# Patient Record
Sex: Male | Born: 1950 | Race: White | Hispanic: No | Marital: Married | Smoking: Former smoker
Health system: Southern US, Community
[De-identification: ages and names within clinical notes are randomized; demographics above are authoritative.]

## PROBLEM LIST (undated history)

## (undated) DIAGNOSIS — E785 Hyperlipidemia, unspecified: Secondary | ICD-10-CM

## (undated) DIAGNOSIS — G4700 Insomnia, unspecified: Secondary | ICD-10-CM

## (undated) DIAGNOSIS — IMO0002 Reserved for concepts with insufficient information to code with codable children: Secondary | ICD-10-CM

## (undated) DIAGNOSIS — L989 Disorder of the skin and subcutaneous tissue, unspecified: Secondary | ICD-10-CM

## (undated) DIAGNOSIS — Z973 Presence of spectacles and contact lenses: Secondary | ICD-10-CM

## (undated) DIAGNOSIS — L309 Dermatitis, unspecified: Secondary | ICD-10-CM

## (undated) DIAGNOSIS — H60399 Other infective otitis externa, unspecified ear: Secondary | ICD-10-CM

## (undated) DIAGNOSIS — M169 Osteoarthritis of hip, unspecified: Secondary | ICD-10-CM

## (undated) DIAGNOSIS — M25552 Pain in left hip: Secondary | ICD-10-CM

## (undated) DIAGNOSIS — K219 Gastro-esophageal reflux disease without esophagitis: Secondary | ICD-10-CM

## (undated) DIAGNOSIS — K220 Achalasia of cardia: Secondary | ICD-10-CM

## (undated) DIAGNOSIS — I10 Essential (primary) hypertension: Secondary | ICD-10-CM

## (undated) DIAGNOSIS — H9319 Tinnitus, unspecified ear: Secondary | ICD-10-CM

## (undated) DIAGNOSIS — M25551 Pain in right hip: Secondary | ICD-10-CM

## (undated) DIAGNOSIS — Z1211 Encounter for screening for malignant neoplasm of colon: Secondary | ICD-10-CM

## (undated) DIAGNOSIS — E663 Overweight: Secondary | ICD-10-CM

## (undated) DIAGNOSIS — M1990 Unspecified osteoarthritis, unspecified site: Secondary | ICD-10-CM

## (undated) DIAGNOSIS — K635 Polyp of colon: Secondary | ICD-10-CM

## (undated) HISTORY — DX: Polyp of colon: K63.5

## (undated) HISTORY — DX: Other infective otitis externa, unspecified ear: H60.399

## (undated) HISTORY — PX: UPPER GASTROINTESTINAL ENDOSCOPY: SHX188

## (undated) HISTORY — DX: Overweight: E66.3

## (undated) HISTORY — PX: ESOPHAGOGASTRODUODENOSCOPY: SHX1529

## (undated) HISTORY — DX: Reserved for concepts with insufficient information to code with codable children: IMO0002

## (undated) HISTORY — DX: Hyperlipidemia, unspecified: E78.5

## (undated) HISTORY — DX: Encounter for screening for malignant neoplasm of colon: Z12.11

## (undated) HISTORY — DX: Dermatitis, unspecified: L30.9

## (undated) HISTORY — DX: Unspecified osteoarthritis, unspecified site: M19.90

## (undated) HISTORY — PX: COLONOSCOPY: SHX174

## (undated) HISTORY — DX: Gastro-esophageal reflux disease without esophagitis: K21.9

## (undated) HISTORY — DX: Pain in right hip: M25.551

## (undated) HISTORY — DX: Achalasia of cardia: K22.0

## (undated) HISTORY — DX: Essential (primary) hypertension: I10

## (undated) HISTORY — DX: Tinnitus, unspecified ear: H93.19

## (undated) HISTORY — DX: Pain in left hip: M25.552

## (undated) HISTORY — PX: TONSILLECTOMY: SUR1361

## (undated) HISTORY — DX: Insomnia, unspecified: G47.00

## (undated) HISTORY — DX: Disorder of the skin and subcutaneous tissue, unspecified: L98.9

---
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
## 1997-04-12 HISTORY — PX: HELLER MYOTOMY: SHX5259

## 2011-03-25 ENCOUNTER — Telehealth: Payer: Self-pay | Admitting: Internal Medicine

## 2011-03-25 ENCOUNTER — Encounter: Payer: Self-pay | Admitting: Internal Medicine

## 2011-03-25 ENCOUNTER — Ambulatory Visit (INDEPENDENT_AMBULATORY_CARE_PROVIDER_SITE_OTHER): Payer: PRIVATE HEALTH INSURANCE | Admitting: Internal Medicine

## 2011-03-25 VITALS — BP 126/90 | HR 69 | Temp 98.0°F | Resp 18 | Ht 66.5 in | Wt 206.0 lb

## 2011-03-25 DIAGNOSIS — Z0000 Encounter for general adult medical examination without abnormal findings: Secondary | ICD-10-CM

## 2011-03-25 DIAGNOSIS — Z79899 Other long term (current) drug therapy: Secondary | ICD-10-CM

## 2011-03-25 DIAGNOSIS — K220 Achalasia of cardia: Secondary | ICD-10-CM

## 2011-03-25 DIAGNOSIS — I10 Essential (primary) hypertension: Secondary | ICD-10-CM

## 2011-03-25 DIAGNOSIS — Z125 Encounter for screening for malignant neoplasm of prostate: Secondary | ICD-10-CM

## 2011-03-25 LAB — BASIC METABOLIC PANEL
CO2: 24 mEq/L (ref 19–32)
Calcium: 9.2 mg/dL (ref 8.4–10.5)
Chloride: 101 mEq/L (ref 96–112)
Glucose, Bld: 110 mg/dL — ABNORMAL HIGH (ref 70–99)
Potassium: 5 mEq/L (ref 3.5–5.3)
Sodium: 138 mEq/L (ref 135–145)

## 2011-03-25 MED ORDER — ENALAPRIL MALEATE 10 MG PO TABS: 10.0000 mg | ORAL_TABLET | Freq: Every day | ORAL | Status: DC

## 2011-03-25 NOTE — Telephone Encounter (Signed)
Lab orders entered for June 2013. 

## 2011-03-25 NOTE — Telephone Encounter (Signed)
Pharmacy comments: [facility] must fill 90-day supply on Enalapril maleate 10mg  tab.

## 2011-03-25 NOTE — Telephone Encounter (Signed)
Refills were given previously x 11. Resent Rx for #90 x 3 refills.

## 2011-03-25 NOTE — Patient Instructions (Signed)
Please schedule cbc, chem7, lipid, lft, psa, tsh and urinalysis v70.0 prior to next physical in May

## 2011-03-27 ENCOUNTER — Encounter: Payer: Self-pay | Admitting: Internal Medicine

## 2011-03-27 DIAGNOSIS — K220 Achalasia of cardia: Secondary | ICD-10-CM | POA: Insufficient documentation

## 2011-03-27 DIAGNOSIS — I10 Essential (primary) hypertension: Secondary | ICD-10-CM | POA: Insufficient documentation

## 2011-03-27 NOTE — Progress Notes (Signed)
  Subjective:    Patient ID: Gabriel [name], male    DOB: October 13, 1950, 60 y.o.   MRN: [id number]  HPI Pt presents to clinic to establish care and for follow up of HTN. BP reviewed minimally elevated but home monitoring has been normotensive. Tolerates ace inhibitor without cough or angioedema. Recalls CPE in May with labs. Known h/o achalasia and has been released from GI. asx without swallowing difficulty. No other complaints.  Past Medical History  Diagnosis Date  . Hypertension    Past Surgical History  Procedure Date  . Esophagus surgery 1999    help with swallowing  . Tonsillectomy and adenoidectomy 1963    reports that he has quit smoking. He has never used smokeless tobacco. He reports that he drinks alcohol. He reports that he does not use illicit drugs. family history includes Alcohol abuse in an unspecified family member; Diabetes in his maternal grandmother; and Ovarian cancer in his mother. No Known Allergies   Review of Systems  HENT: Negative for facial swelling.   Respiratory: Negative for cough and shortness of breath.   All other systems reviewed and are negative.       Objective:   Physical Exam  Physical Exam  Nursing note and vitals reviewed. Constitutional: Appears well-developed and well-nourished. No distress.  HENT:  Head: Normocephalic and atraumatic.  Right Ear: External ear normal.  Left Ear: External ear normal.  Eyes: Conjunctivae are normal. No scleral icterus.  Neck: Neck supple. Carotid bruit is not present.  Cardiovascular: Normal rate, regular rhythm and normal heart sounds.  Exam reveals no gallop and no friction rub.   No murmur heard. Pulmonary/Chest: Effort normal and breath sounds normal. No respiratory distress. He has no wheezes. no rales.  Lymphadenopathy:    He has no cervical adenopathy.  Neurological:Alert.  Skin: Skin is warm and dry. Not diaphoretic.  Psychiatric: Has a normal mood and affect.        Assessment & Plan:

## 2011-03-27 NOTE — Assessment & Plan Note (Signed)
asx

## 2011-03-27 NOTE — Assessment & Plan Note (Signed)
Stable. Rf enalapril. Obtain chem7. Schedule cpe in the spring

## 2011-04-01 ENCOUNTER — Telehealth: Payer: Self-pay | Admitting: Internal Medicine

## 2011-04-01 NOTE — Telephone Encounter (Signed)
Received medical records from [facility] center.

## 2011-04-20 ENCOUNTER — Encounter: Payer: Self-pay | Admitting: Internal Medicine

## 2011-04-20 ENCOUNTER — Ambulatory Visit (INDEPENDENT_AMBULATORY_CARE_PROVIDER_SITE_OTHER): Payer: PRIVATE HEALTH INSURANCE | Admitting: Internal Medicine

## 2011-04-20 DIAGNOSIS — M7700 Medial epicondylitis, unspecified elbow: Secondary | ICD-10-CM | POA: Insufficient documentation

## 2011-04-20 NOTE — Assessment & Plan Note (Signed)
Attempt 7-10 day course of lodine with food and no other nsaids.Followup if no improvement or worsening.

## 2011-04-20 NOTE — Progress Notes (Signed)
  Subjective:    Patient ID: Gabriel [name], male    DOB: 02-Apr-1951, 61 y.o.   MRN: [id number]  HPI Pt presents to clinic for evaluation of arm pain. Notes 3wk h/o left medial elbow pain without h/o trauma. Does lift boxes at work regularly. Noticed a popping sensation at the area once but has FROM. No pain on flexion or extension but pain occurs with pronation/supination. Took lodine a few times and noted improvement. No other alleviating or exacerbating factors. No other complaints. Total time of appointment ~21 minutes of which >50% spent in counseling.   Past Medical History  Diagnosis Date  . Hypertension    Past Surgical History  Procedure Date  . Esophagus surgery 1999    help with swallowing  . Tonsillectomy and adenoidectomy 1963    reports that he has quit smoking. He has never used smokeless tobacco. He reports that he drinks alcohol. He reports that he does not use illicit drugs. family history includes Alcohol abuse in an unspecified family member; Diabetes in his maternal grandmother; and Ovarian cancer in his mother. No Known Allergies   Review of Systems see hpi     Objective:   Physical Exam  Nursing note and vitals reviewed. Constitutional: He appears well-developed and well-nourished. No distress.  HENT:  Head: Normocephalic and atraumatic.  Musculoskeletal:       FROM left arm and elbow. No erythema, warmth or effusion. No biceps tendon tenderness. Pain localized to medial epicondyl area. Pain reproduced with pronation and supination.  Neurological: He is alert.  Skin: Skin is warm and dry. He is not diaphoretic.  Psychiatric: He has a normal mood and affect.          Assessment & Plan:

## 2011-09-14 ENCOUNTER — Other Ambulatory Visit: Payer: Self-pay | Admitting: *Deleted

## 2011-09-14 DIAGNOSIS — Z125 Encounter for screening for malignant neoplasm of prostate: Secondary | ICD-10-CM

## 2011-09-14 DIAGNOSIS — Z0000 Encounter for general adult medical examination without abnormal findings: Secondary | ICD-10-CM

## 2011-09-14 LAB — HEPATIC FUNCTION PANEL
ALT: 41 U/L (ref 0–53)
AST: 21 U/L (ref 0–37)
Albumin: 4.5 g/dL (ref 3.5–5.2)
Alkaline Phosphatase: 44 U/L (ref 39–117)
Bilirubin, Direct: 0.1 mg/dL (ref 0.0–0.3)
Indirect Bilirubin: 0.6 mg/dL (ref 0.0–0.9)
Total Bilirubin: 0.7 mg/dL (ref 0.3–1.2)
Total Protein: 6.9 g/dL (ref 6.0–8.3)

## 2011-09-14 LAB — CBC
HCT: 46.9 % (ref 39.0–52.0)
Hemoglobin: 15.7 g/dL (ref 13.0–17.0)
MCHC: 33.5 g/dL (ref 30.0–36.0)
MCV: 93.1 fL (ref 78.0–100.0)

## 2011-09-14 LAB — LIPID PANEL
Cholesterol: 214 mg/dL — ABNORMAL HIGH (ref 0–200)
LDL Cholesterol: 143 mg/dL — ABNORMAL HIGH (ref 0–99)
VLDL: 22 mg/dL (ref 0–40)

## 2011-09-14 LAB — TSH: TSH: 0.922 u[IU]/mL (ref 0.350–4.500)

## 2011-09-14 LAB — BASIC METABOLIC PANEL
BUN: 13 mg/dL (ref 6–23)
Creat: 0.93 mg/dL (ref 0.50–1.35)
Glucose, Bld: 110 mg/dL — ABNORMAL HIGH (ref 70–99)
Potassium: 5 mEq/L (ref 3.5–5.3)

## 2011-09-15 LAB — URINALYSIS, ROUTINE W REFLEX MICROSCOPIC
Leukocytes, UA: NEGATIVE
Nitrite: NEGATIVE
Specific Gravity, Urine: 1.017 (ref 1.005–1.030)
pH: 6 (ref 5.0–8.0)

## 2011-09-22 ENCOUNTER — Telehealth: Payer: Self-pay | Admitting: Internal Medicine

## 2011-09-22 ENCOUNTER — Ambulatory Visit (INDEPENDENT_AMBULATORY_CARE_PROVIDER_SITE_OTHER): Payer: PRIVATE HEALTH INSURANCE | Admitting: Internal Medicine

## 2011-09-22 VITALS — BP 124/100 | HR 78 | Temp 97.5°F | Resp 18 | Ht 66.5 in | Wt 207.0 lb

## 2011-09-22 DIAGNOSIS — R079 Chest pain, unspecified: Secondary | ICD-10-CM

## 2011-09-22 DIAGNOSIS — Z0000 Encounter for general adult medical examination without abnormal findings: Secondary | ICD-10-CM

## 2011-09-22 DIAGNOSIS — E785 Hyperlipidemia, unspecified: Secondary | ICD-10-CM

## 2011-09-22 DIAGNOSIS — R739 Hyperglycemia, unspecified: Secondary | ICD-10-CM

## 2011-09-22 MED ORDER — ENALAPRIL MALEATE 10 MG PO TABS: 10.0000 mg | ORAL_TABLET | Freq: Every day | ORAL | Status: DC

## 2011-09-22 MED ORDER — ETODOLAC 500 MG PO TABS: 500.0000 mg | ORAL_TABLET | Freq: Two times a day (BID) | ORAL | Status: DC | PRN

## 2011-09-22 NOTE — Telephone Encounter (Signed)
Lab orders entered for September 2013. 

## 2011-09-22 NOTE — Patient Instructions (Addendum)
We are in the process of scheduling your heart stress test. Please schedule fasting labs prior to your next visit (lipid-272.4 and a1c-hyperglycemia)

## 2011-09-29 ENCOUNTER — Ambulatory Visit (HOSPITAL_COMMUNITY): Payer: PRIVATE HEALTH INSURANCE | Attending: Cardiology | Admitting: Radiology

## 2011-09-29 ENCOUNTER — Encounter: Payer: Self-pay | Admitting: Internal Medicine

## 2011-09-29 VITALS — BP 145/91 | Ht 65.5 in | Wt 203.0 lb

## 2011-09-29 DIAGNOSIS — R0602 Shortness of breath: Secondary | ICD-10-CM

## 2011-09-29 DIAGNOSIS — R079 Chest pain, unspecified: Secondary | ICD-10-CM | POA: Insufficient documentation

## 2011-09-29 DIAGNOSIS — Z0000 Encounter for general adult medical examination without abnormal findings: Secondary | ICD-10-CM | POA: Insufficient documentation

## 2011-09-29 MED ORDER — TECHNETIUM TC 99M TETROFOSMIN IV KIT
11.0000 | PACK | Freq: Once | INTRAVENOUS | Status: AC | PRN
  Administered 2011-09-29: 11 via INTRAVENOUS

## 2011-09-29 MED ORDER — TECHNETIUM TC 99M TETROFOSMIN IV KIT
33.0000 | PACK | Freq: Once | INTRAVENOUS | Status: AC | PRN
  Administered 2011-09-29: 33 via INTRAVENOUS

## 2011-09-29 NOTE — Assessment & Plan Note (Signed)
Nl exam

## 2011-09-29 NOTE — Progress Notes (Signed)
[facility] SITE 3 NUCLEAR MED [address] [place] [address] [phone]  Cardiology Nuclear Med Study  Gabriel [name] is a 61 y.o. male     MRN : [id number]     DOB: Oct 03, 1950  Procedure Date: 09/29/2011  Nuclear Med Background Indication for Stress Test:  Evaluation for Ischemia History:  No Prior Cardiac History Cardiac Risk Factors: History of Smoking and Hypertension  Symptoms:  Chest Pain with Exertion (last date of chest discomfort 1 week ago) and DOE   Nuclear Pre-Procedure Caffeine/Decaff Intake:  None NPO After: 7:00pm   Lungs:  clear O2 Sat: 98% on room air. IV 0.9% NS with Angio Cath:  20g  IV Site: R Antecubital  IV Started by:  Stanton Kidney, EMT-P  Chest Size (in):  42 Cup Size: n/a  Height: 5' 5.5" (1.664 m)  Weight:  203 lb (92.08 kg)  BMI:  Body mass index is 33.27 kg/(m^2). Tech Comments:  NA    Nuclear Med Study 1 or 2 day study: 1 day  Stress Test Type:  Stress  Reading MD: Cassell Clement, MD  Order Authorizing Provider:  Charlynn Court, MD  Resting Radionuclide: Technetium 35m Tetrofosmin  Resting Radionuclide Dose: 11.0 mCi   Stress Radionuclide:  Technetium 66m Tetrofosmin  Stress Radionuclide Dose: 33.0 mCi           Stress Protocol Rest HR: 76 Stress HR: 157  Rest BP: 145/91 Stress BP: 206/73  Exercise Time (min): 7:01 METS: 8.5   Predicted Max HR: 161 bpm % Max HR: 97.52 bpm Rate Pressure Product: 27253   Dose of Adenosine (mg):  n/a Dose of Lexiscan: n/a mg  Dose of Atropine (mg): n/a Dose of Dobutamine: n/a mcg/kg/min (at max HR)  Stress Test Technologist: Bonnita Levan, RN  Nuclear Technologist:  Domenic Polite, CNMT     Rest Procedure:  Myocardial perfusion imaging was performed at rest 45 minutes following the intravenous administration of Technetium 55m Tetrofosmin. Rest ECG: NSR  Stress Procedure:  The patient performed treadmill exercise using a Bruce  Protocol for 7:01 minutes. The patient stopped due  to dyspnea, fatigue and c/o of CP ( 5/10 with radiation to neck,) with exercise. His CP immediately resolved in recovery.  There were no acute changes. Technetium 40m Tetrofosmin was injected at peak exercise and myocardial perfusion imaging was performed after a brief delay. Stress ECG: No significant change from baseline ECG  QPS Raw Data Images:  Normal; no motion artifact; normal heart/lung ratio. Stress Images:  Normal homogeneous uptake in all areas of the myocardium. Rest Images:  Normal homogeneous uptake in all areas of the myocardium. Subtraction (SDS):  No evidence of ischemia. Transient Ischemic Dilatation (Normal <1.22):  0.85 Lung/Heart Ratio (Normal <0.45):  0.30  Quantitative Gated Spect Images QGS EDV:  75 ml QGS ESV:  29 ml  Impression Exercise Capacity:  Fair exercise capacity. BP Response:  Hypertensive blood pressure response. Clinical Symptoms:  There is dyspnea.Brief chest pain resolves immediately in recovery. Mild exp. wheezing ECG Impression:  No significant ST segment change suggestive of ischemia. Comparison with Prior Nuclear Study: No previous nuclear study performed  Overall Impression:  Normal stress nuclear study.  LV Ejection Fraction: 61%.  LV Wall Motion:  NL LV Function; NL Wall Motion  [employer]

## 2011-09-29 NOTE — Assessment & Plan Note (Signed)
EKG obtained demonstrating nsr with nl intervals and axis. Schedule nuclear stress test for further evaluation.

## 2011-09-29 NOTE — Progress Notes (Signed)
  Subjective:    Patient ID: Gabriel [name], male    DOB: 01-13-1951, 61 y.o.   MRN: [id number]  HPI Pt presents to clinic for annual physical. bp rechecked 128/82. Home bp's nl. H/o achalasia and asx. Notes intermittent chest discomfort without radiation. Occurs with exertion and has some degree of associated dyspnea. Duration 1-2 mins followed by spontaneous resolution. Currently asx.  Past Medical History  Diagnosis Date  . Hypertension    Past Surgical History  Procedure Date  . Esophagus surgery 1999    help with swallowing  . Tonsillectomy and adenoidectomy 1963    reports that he has quit smoking. He has never used smokeless tobacco. He reports that he drinks alcohol. He reports that he does not use illicit drugs. family history includes Alcohol abuse in an unspecified family member; Diabetes in his maternal grandmother; and Ovarian cancer in his mother. No Known Allergies   Review of Systems see hpi     Objective:   Physical Exam  Physical Exam  Nursing note and vitals reviewed. Constitutional: He appears well-developed and well-nourished. No distress.  HENT:  Head: Normocephalic and atraumatic.  Right Ear: Tympanic membrane and external ear normal.  Left Ear: Tympanic membrane and external ear normal.  Nose: Nose normal.  Mouth/Throat: Uvula is midline, oropharynx is clear and moist and mucous membranes are normal. No oropharyngeal exudate.  Eyes: Conjunctivae and EOM are normal. Pupils are equal, round, and reactive to light. Right eye exhibits no discharge. Left eye exhibits no discharge. No scleral icterus.  Neck: Neck supple. Carotid bruit is not present. No thyromegaly present.  Cardiovascular: Normal rate, regular rhythm and normal heart sounds.  Exam reveals no gallop and no friction rub.   No murmur heard. Pulmonary/Chest: Effort normal and breath sounds normal. No respiratory distress. He has no wheezes. He has no rales.  Abdominal: Soft. He exhibits no  distension and no mass. There is no hepatosplenomegaly. There is no tenderness. There is no rebound. Hernia confirmed negative in the right inguinal area and confirmed negative in the left inguinal area.  Lymphadenopathy:    He has no cervical adenopathy.  Neurological: He is alert.  Skin: Skin is warm and dry. He is not diaphoretic.  Psychiatric: He has a normal mood and affect.        Assessment & Plan:

## 2011-10-01 ENCOUNTER — Ambulatory Visit (INDEPENDENT_AMBULATORY_CARE_PROVIDER_SITE_OTHER): Payer: PRIVATE HEALTH INSURANCE | Admitting: Internal Medicine

## 2011-10-01 ENCOUNTER — Encounter: Payer: Self-pay | Admitting: Internal Medicine

## 2011-10-01 VITALS — BP 122/80 | HR 79 | Temp 98.1°F | Resp 16

## 2011-10-01 DIAGNOSIS — R079 Chest pain, unspecified: Secondary | ICD-10-CM

## 2011-10-01 MED ORDER — ESOMEPRAZOLE MAGNESIUM 40 MG PO CPDR: 40.0000 mg | DELAYED_RELEASE_CAPSULE | Freq: Every day | ORAL | Status: DC

## 2011-10-01 NOTE — Assessment & Plan Note (Signed)
Long discussion held. S/p nuclear stress test with nl imaging but + cp during test. Reviewed ~90% accuracy of test and possibility of balanced ischemia. History now includes typical and atypical features. Recommended cardiology consult. Pt feels that sx's are much more likely GI related and wishes to pursue dietary changes and weight loss. Add samples of nexium 40mg  po qd #20. Advised to present to ED with further CP with typical features (discussed in layman's terms.) States understanding and agreement.

## 2011-10-01 NOTE — Progress Notes (Signed)
  Subjective:    Patient ID: Gabriel [name], male    DOB: 1951-03-25, 61 y.o.   MRN: [id number]  HPI Pt presents to clinic for follow up of chest pain and stress test. Nuclear stress test reviewed with nl perfusion images. Report notes experienced 5/10 CP with radiation to neck during exercise component. Sx's resolved quickly and no noted EKG changes. Describes intermittent chest disomfort sometimes exertional. Other times notes discomfort at night while lying down improved by sitting up. Does have h/o achalasia. States he feels like the discomfort is GI related. Cardiac risk factors include HTN, hyperlipidemia and obesity.   Past Medical History  Diagnosis Date  . Hypertension    Past Surgical History  Procedure Date  . Esophagus surgery 1999    help with swallowing  . Tonsillectomy and adenoidectomy 1963    reports that he has quit smoking. He has never used smokeless tobacco. He reports that he drinks alcohol. He reports that he does not use illicit drugs. family history includes Alcohol abuse in an unspecified family member; Diabetes in his maternal grandmother; and Ovarian cancer in his mother. No Known Allergies   Review of Systems see hpi    Objective:   Physical Exam  Nursing note and vitals reviewed. Constitutional: He appears well-developed and well-nourished.  HENT:  Head: Normocephalic and atraumatic.  Neurological: He is alert.  Psychiatric: He has a normal mood and affect.          Assessment & Plan:

## 2011-12-23 ENCOUNTER — Ambulatory Visit: Payer: PRIVATE HEALTH INSURANCE | Admitting: Internal Medicine

## 2011-12-23 NOTE — Telephone Encounter (Signed)
Lab orders released/[facility] 

## 2011-12-23 NOTE — Addendum Note (Signed)
Addended by: Regis Bill on: 12/23/2011 09:08 AM   Modules accepted: Orders

## 2011-12-24 LAB — LIPID PANEL
HDL: 49 mg/dL (ref >39)
LDL Cholesterol: 125 mg/dL — ABNORMAL HIGH (ref 0–99)
Triglycerides: 89 mg/dL (ref <150)
VLDL: 18 mg/dL (ref 0–40)

## 2012-01-04 ENCOUNTER — Ambulatory Visit (INDEPENDENT_AMBULATORY_CARE_PROVIDER_SITE_OTHER): Payer: PRIVATE HEALTH INSURANCE | Admitting: Internal Medicine

## 2012-01-04 ENCOUNTER — Encounter: Payer: Self-pay | Admitting: Internal Medicine

## 2012-01-04 VITALS — BP 148/96 | HR 90 | Temp 98.4°F | Resp 16 | Wt 204.0 lb

## 2012-01-04 DIAGNOSIS — E785 Hyperlipidemia, unspecified: Secondary | ICD-10-CM

## 2012-01-04 DIAGNOSIS — R079 Chest pain, unspecified: Secondary | ICD-10-CM

## 2012-01-04 DIAGNOSIS — I10 Essential (primary) hypertension: Secondary | ICD-10-CM

## 2012-01-04 MED ORDER — ESOMEPRAZOLE MAGNESIUM 40 MG PO CPDR: 40.0000 mg | DELAYED_RELEASE_CAPSULE | Freq: Every day | ORAL | Status: DC

## 2012-01-04 NOTE — Patient Instructions (Signed)
Please schedule labs prior to next visit chem7-v58.69 and a1c-hyperglycemia

## 2012-01-09 DIAGNOSIS — E785 Hyperlipidemia, unspecified: Secondary | ICD-10-CM | POA: Insufficient documentation

## 2012-01-09 NOTE — Assessment & Plan Note (Signed)
Improved. Low-fat diet exercise and weight loss.

## 2012-01-09 NOTE — Assessment & Plan Note (Signed)
Isolated elevation. Continue home monitoring. Maintain dose of Vasotec

## 2012-01-09 NOTE — Progress Notes (Signed)
  Subjective:    Patient ID: Gabriel [name], male    DOB: [birth year], 61 y.o.   MRN: [id number]  HPI Pt presents to clinic for followup of multiple medical problems. Previous chest pain resolved with PPI. Reviewed improved cholesterol status post diet and weight loss. Blood pressure elevated however home monitoring has been normotensive. No active complaint.  Past Medical History  Diagnosis Date  . Hypertension    Past Surgical History  Procedure Date  . Esophagus surgery 1999    help with swallowing  . Tonsillectomy and adenoidectomy 1963    reports that he has quit smoking. He has never used smokeless tobacco. He reports that he drinks alcohol. He reports that he does not use illicit drugs. family history includes Alcohol abuse in an unspecified family member; Diabetes in his maternal grandmother; and Ovarian cancer in his mother. No Known Allergies    Review of Systems see hpi     Objective:   Physical Exam  Nursing note and vitals reviewed. Constitutional: He appears well-developed and well-nourished. No distress.  HENT:  Head: Normocephalic and atraumatic.  Cardiovascular: Normal rate, regular rhythm and normal heart sounds.  Exam reveals no gallop and no friction rub.   No murmur heard. Pulmonary/Chest: Effort normal and breath sounds normal. No respiratory distress. He has no wheezes. He has no rales.  Neurological: He is alert.  Skin: Skin is warm and dry. He is not diaphoretic.  Psychiatric: He has a normal mood and affect.          Assessment & Plan:

## 2012-01-09 NOTE — Assessment & Plan Note (Signed)
Result with PPI. Continue Nexium daily

## 2012-04-12 HISTORY — PX: COLONOSCOPY: SHX174

## 2012-05-02 ENCOUNTER — Ambulatory Visit: Payer: PRIVATE HEALTH INSURANCE | Admitting: Internal Medicine

## 2012-05-16 ENCOUNTER — Encounter: Payer: Self-pay | Admitting: Family Medicine

## 2012-05-16 ENCOUNTER — Ambulatory Visit (INDEPENDENT_AMBULATORY_CARE_PROVIDER_SITE_OTHER): Payer: PRIVATE HEALTH INSURANCE | Admitting: Family Medicine

## 2012-05-16 VITALS — BP 148/82 | HR 86 | Temp 98.2°F | Ht 66.5 in | Wt 204.1 lb

## 2012-05-16 DIAGNOSIS — K219 Gastro-esophageal reflux disease without esophagitis: Secondary | ICD-10-CM

## 2012-05-16 DIAGNOSIS — I10 Essential (primary) hypertension: Secondary | ICD-10-CM

## 2012-05-16 DIAGNOSIS — K220 Achalasia of cardia: Secondary | ICD-10-CM

## 2012-05-16 DIAGNOSIS — E785 Hyperlipidemia, unspecified: Secondary | ICD-10-CM

## 2012-05-16 MED ORDER — RANITIDINE HCL 300 MG PO TABS: 300.0000 mg | ORAL_TABLET | Freq: Every evening | ORAL | Status: DC | PRN

## 2012-05-16 NOTE — Patient Instructions (Addendum)
Next appt annual with labs at or prior to include, lipid, renal, cbc, tsh, hepatic, urinalysis, hgba1c   Gastroesophageal Reflux Disease, Adult Gastroesophageal reflux disease (GERD) happens when acid from your stomach flows up into the esophagus. When acid comes in contact with the esophagus, the acid causes soreness (inflammation) in the esophagus. Over time, GERD may create small holes (ulcers) in the lining of the esophagus. CAUSES   Increased body weight. This puts pressure on the stomach, making acid rise from the stomach into the esophagus.  Smoking. This increases acid production in the stomach.  Drinking alcohol. This causes decreased pressure in the lower esophageal sphincter (valve or ring of muscle between the esophagus and stomach), allowing acid from the stomach into the esophagus.  Late evening meals and a full stomach. This increases pressure and acid production in the stomach.  A malformed lower esophageal sphincter. Sometimes, no cause is found. SYMPTOMS   Burning pain in the lower part of the mid-chest behind the breastbone and in the mid-stomach area. This may occur twice a week or more often.  Trouble swallowing.  Sore throat.  Dry cough.  Asthma-like symptoms including chest tightness, shortness of breath, or wheezing. DIAGNOSIS  Your caregiver may be able to diagnose GERD based on your symptoms. In some cases, X-rays and other tests may be done to check for complications or to check the condition of your stomach and esophagus. TREATMENT  Your caregiver may recommend over-the-counter or prescription medicines to help decrease acid production. Ask your caregiver before starting or adding any new medicines.  HOME CARE INSTRUCTIONS   Change the factors that you can control. Ask your caregiver for guidance concerning weight loss, quitting smoking, and alcohol consumption.  Avoid foods and drinks that make your symptoms worse, such as:  Caffeine or alcoholic  drinks.  Chocolate.  Peppermint or mint flavorings.  Garlic and onions.  Spicy foods.  Citrus fruits, such as oranges, lemons, or limes.  Tomato-based foods such as sauce, chili, salsa, and pizza.  Fried and fatty foods.  Avoid lying down for the 3 hours prior to your bedtime or prior to taking a nap.  Eat small, frequent meals instead of large meals.  Wear loose-fitting clothing. Do not wear anything tight around your waist that causes pressure on your stomach.  Raise the head of your bed 6 to 8 inches with wood blocks to help you sleep. Extra pillows will not help.  Only take over-the-counter or prescription medicines for pain, discomfort, or fever as directed by your caregiver.  Do not take aspirin, ibuprofen, or other nonsteroidal anti-inflammatory drugs (NSAIDs). SEEK IMMEDIATE MEDICAL CARE IF:   You have pain in your arms, neck, jaw, teeth, or back.  Your pain increases or changes in intensity or duration.  You develop nausea, vomiting, or sweating (diaphoresis).  You develop shortness of breath, or you faint.  Your vomit is green, yellow, black, or looks like coffee grounds or blood.  Your stool is red, bloody, or black. These symptoms could be signs of other problems, such as heart disease, gastric bleeding, or esophageal bleeding. MAKE SURE YOU:   Understand these instructions.  Will watch your condition.  Will get help right away if you are not doing well or get worse. Document Released: 01/06/2005 Document Revised: 06/21/2011 Document Reviewed: 10/16/2010 [facility] Patient Information 2013 [place], [place].

## 2012-05-21 ENCOUNTER — Encounter: Payer: Self-pay | Admitting: Family Medicine

## 2012-05-21 DIAGNOSIS — K219 Gastro-esophageal reflux disease without esophagitis: Secondary | ICD-10-CM

## 2012-05-21 HISTORY — DX: Gastro-esophageal reflux disease without esophagitis: K21.9

## 2012-05-21 NOTE — Assessment & Plan Note (Signed)
Unable to sleep on left secondary to discomfort and sob, this is persistent.

## 2012-05-21 NOTE — Assessment & Plan Note (Signed)
Mild avoid trans fats, consider krill oil caps daily

## 2012-05-21 NOTE — Progress Notes (Signed)
Patient ID: Gabriel [name], male   DOB: 1950-09-29, 62 y.o.   MRN: [id number] Tarique Loveall [id number] [birth year] 05/21/2012      Progress Note-Follow Up  Subjective  Chief Complaint  Chief Complaint  Patient presents with  . Follow-up    4 month    HPI  Patient is a [age]-year-old male who is in today for followup. Overall he is doing fairly well. He continues to struggle with his achalasia but this is unchanged. Is unable to sleep on his left side without having discomfort and shortness of breath. No acute illness. No headache or recent chest pain no shortness or breath or palpitations. Does have occasional episodes of loose stool but no bloody or tarry stool. No urinary symptoms noted.  Past Medical History  Diagnosis Date  . Hypertension     Past Surgical History  Procedure Laterality Date  . Esophagus surgery  1999    help with swallowing  . Tonsillectomy and adenoidectomy  1963    Family History  Problem Relation Age of Onset  . Alcohol abuse      mother/father  . Ovarian cancer Mother   . Diabetes Maternal Grandmother     History   Social History  . Marital Status: Married    Spouse Name: N/A    Number of Children: N/A  . Years of Education: N/A   Occupational History  . Not on file.   Social History Main Topics  . Smoking status: Former Games developer  . Smokeless tobacco: Never Used     Comment: 1 ppd for 25 years quit 44'  . Alcohol Use: Yes     Comment: Wine nightly  . Drug Use: No  . Sexually Active: Not on file   Other Topics Concern  . Not on file   Social History Narrative  . No narrative on file    Current Outpatient Prescriptions on File Prior to Visit  Medication Sig Dispense Refill  . enalapril (VASOTEC) 10 MG tablet Take 1 tablet (10 mg total) by mouth daily.  90 tablet  3  . etodolac (LODINE) 500 MG tablet Take 1 tablet (500 mg total) by mouth 2 (two) times daily as needed. Arthritis pain  60 tablet  3  . NON FORMULARY Take by mouth  daily. LifeGuard Antioxidant.      . Red Yeast Rice Extract 600 MG TABS Take by mouth daily.       No current facility-administered medications on file prior to visit.    No Known Allergies  Review of Systems  Review of Systems  Constitutional: Negative for fever and malaise/fatigue.  HENT: Negative for congestion.   Eyes: Negative for discharge.  Respiratory: Negative for shortness of breath.   Cardiovascular: Negative for chest pain, palpitations and leg swelling.  Gastrointestinal: Positive for heartburn. Negative for nausea, abdominal pain and diarrhea.  Genitourinary: Negative for dysuria.  Musculoskeletal: Negative for falls.  Skin: Negative for rash.  Neurological: Negative for loss of consciousness and headaches.  Endo/Heme/Allergies: Negative for polydipsia.  Psychiatric/Behavioral: Negative for depression and suicidal ideas. The patient is not nervous/anxious and does not have insomnia.     Objective  BP 148/82  Pulse 86  Temp(Src) 98.2 F (36.8 C) (Oral)  Ht 5' 6.5" (1.689 m)  Wt 204 lb 1.9 oz (92.588 kg)  BMI 32.46 kg/m2  SpO2 97%  Physical Exam  Physical Exam  Constitutional: He is oriented to person, place, and time and well-developed, well-nourished, and in no distress. No distress.  HENT:  Head: Normocephalic and atraumatic.  Eyes: Conjunctivae are normal.  Neck: Neck supple. No thyromegaly present.  Cardiovascular: Normal rate and regular rhythm.  Exam reveals gallop.   No murmur heard. Pulmonary/Chest: Effort normal and breath sounds normal. No respiratory distress.  Abdominal: He exhibits no distension and no mass. There is no tenderness.  Musculoskeletal: He exhibits no edema.  Neurological: He is alert and oriented to person, place, and time.  Skin: Skin is warm.  Psychiatric: Memory, affect and judgment normal.    Lab Results  Component Value Date   TSH 0.922 09/14/2011   Lab Results  Component Value Date   WBC 5.1 09/14/2011   HGB 15.7  09/14/2011   HCT 46.9 09/14/2011   MCV 93.1 09/14/2011   PLT 257 09/14/2011   Lab Results  Component Value Date   CREATININE 0.93 09/14/2011   BUN 13 09/14/2011   NA 140 09/14/2011   K 5.0 09/14/2011   CL 103 09/14/2011   CO2 28 09/14/2011   Lab Results  Component Value Date   ALT 41 09/14/2011   AST 21 09/14/2011   ALKPHOS 44 09/14/2011   BILITOT 0.7 09/14/2011   Lab Results  Component Value Date   CHOL 192 12/23/2011   Lab Results  Component Value Date   HDL 49 12/23/2011   Lab Results  Component Value Date   LDLCALC 125* 12/23/2011   Lab Results  Component Value Date   TRIG 89 12/23/2011   Lab Results  Component Value Date   CHOLHDL 3.9 12/23/2011     Assessment & Plan  Achalasia Unable to sleep on left secondary to discomfort and sob, this is persistent.   HTN (hypertension) Mild elevation, minimzie sodium and avoid caffeine, report worsening symptoms. Enalapril 10 mg daily  Hyperlipidemia Mild avoid trans fats, consider krill oil caps daily  Esophageal reflux Avoid offending foods and start Ranitidine 300 mg po qhs

## 2012-05-21 NOTE — Assessment & Plan Note (Signed)
Mild elevation, minimzie sodium and avoid caffeine, report worsening symptoms. Enalapril 10 mg daily

## 2012-05-21 NOTE — Assessment & Plan Note (Signed)
Avoid offending foods and start Ranitidine 300 mg po qhs

## 2012-07-17 ENCOUNTER — Telehealth: Payer: Self-pay | Admitting: *Deleted

## 2012-07-17 DIAGNOSIS — Z0000 Encounter for general adult medical examination without abnormal findings: Secondary | ICD-10-CM

## 2012-07-17 LAB — HEPATIC FUNCTION PANEL
AST: 14 U/L (ref 0–37)
Alkaline Phosphatase: 61 U/L (ref 39–117)
Bilirubin, Direct: 0.1 mg/dL (ref 0.0–0.3)
Indirect Bilirubin: 0.4 mg/dL (ref 0.0–0.9)
Total Bilirubin: 0.5 mg/dL (ref 0.3–1.2)

## 2012-07-17 LAB — CBC WITH DIFFERENTIAL/PLATELET
Eosinophils Absolute: 0.3 10*3/uL (ref 0.0–0.7)
HCT: 45.2 % (ref 39.0–52.0)
Hemoglobin: 15.5 g/dL (ref 13.0–17.0)
Lymphs Abs: 1.3 10*3/uL (ref 0.7–4.0)
MCH: 30.9 pg (ref 26.0–34.0)
MCV: 90 fL (ref 78.0–100.0)
Monocytes Absolute: 0.8 10*3/uL (ref 0.1–1.0)
Monocytes Relative: 7 % (ref 3–12)
Neutrophils Relative %: 78 % — ABNORMAL HIGH (ref 43–77)
RBC: 5.02 MIL/uL (ref 4.22–5.81)
WBC: 10.6 10*3/uL — ABNORMAL HIGH (ref 4.0–10.5)

## 2012-07-17 LAB — BASIC METABOLIC PANEL
CO2: 27 mEq/L (ref 19–32)
Chloride: 101 mEq/L (ref 96–112)
Potassium: 5.2 mEq/L (ref 3.5–5.3)
Sodium: 137 mEq/L (ref 135–145)

## 2012-07-17 LAB — LIPID PANEL
Total CHOL/HDL Ratio: 3.6 Ratio
VLDL: 15 mg/dL (ref 0–40)

## 2012-07-17 LAB — TSH: TSH: 1.205 u[IU]/mL (ref 0.350–4.500)

## 2012-07-17 NOTE — Telephone Encounter (Signed)
Pt presented to the lab and orders were entered per 05/2012 office note as below:  Next appt annual with labs at or prior to include, lipid, renal, cbc, tsh, hepatic, urinalysis, hgba1c

## 2012-07-18 LAB — URINALYSIS, ROUTINE W REFLEX MICROSCOPIC
Bilirubin Urine: NEGATIVE
Glucose, UA: 100 mg/dL — AB
Leukocytes, UA: NEGATIVE
pH: 5.5 (ref 5.0–8.0)

## 2012-07-18 LAB — URINALYSIS, MICROSCOPIC ONLY

## 2012-07-20 ENCOUNTER — Ambulatory Visit (INDEPENDENT_AMBULATORY_CARE_PROVIDER_SITE_OTHER): Payer: PRIVATE HEALTH INSURANCE | Admitting: Family Medicine

## 2012-07-20 ENCOUNTER — Telehealth: Payer: Self-pay

## 2012-07-20 ENCOUNTER — Encounter: Payer: Self-pay | Admitting: Family Medicine

## 2012-07-20 VITALS — BP 138/82 | HR 84 | Temp 98.3°F | Ht 66.5 in | Wt 200.4 lb

## 2012-07-20 VITALS — BP 138/82 | HR 84 | Temp 98.3°F | Ht 66.5 in | Wt 200.0 lb

## 2012-07-20 DIAGNOSIS — R81 Glycosuria: Secondary | ICD-10-CM

## 2012-07-20 DIAGNOSIS — I10 Essential (primary) hypertension: Secondary | ICD-10-CM

## 2012-07-20 DIAGNOSIS — Z0000 Encounter for general adult medical examination without abnormal findings: Secondary | ICD-10-CM

## 2012-07-20 DIAGNOSIS — E785 Hyperlipidemia, unspecified: Secondary | ICD-10-CM

## 2012-07-20 DIAGNOSIS — K219 Gastro-esophageal reflux disease without esophagitis: Secondary | ICD-10-CM

## 2012-07-20 DIAGNOSIS — R040 Epistaxis: Secondary | ICD-10-CM

## 2012-07-20 LAB — POCT URINALYSIS DIPSTICK
Leukocytes, UA: NEGATIVE
Nitrite, UA: NEGATIVE
Protein, UA: NEGATIVE
Urobilinogen, UA: 0.2

## 2012-07-20 MED ORDER — MUPIROCIN 2 % EX OINT: TOPICAL_OINTMENT | CUTANEOUS | Status: DC

## 2012-07-20 NOTE — Telephone Encounter (Signed)
Message copied by Court Joy on Thu Jul 20, 2012  2:36 PM ------      Message from: Cristy Hilts F      Created: Thu Jul 20, 2012  1:59 PM      Regarding: Result note       Hi Judeth Cornfield, Just wanted you to know that you received the result note because Neysa Bonito routed it to you.            Neysa Bonito, do you know why you routed the result note to [place]?            Jilda ------

## 2012-07-20 NOTE — Patient Instructions (Addendum)
Labs prior lipid, renal, cbc, hepatic, tsh, hgba1c, urinalysis  Probiotic such as Digestive Advantage daily  DASH Diet The DASH diet stands for "Dietary Approaches to Stop Hypertension." It is a healthy eating plan that has been shown to reduce high blood pressure (hypertension) in as little as 14 days, while also possibly providing other significant health benefits. These other health benefits include reducing the risk of breast cancer after menopause and reducing the risk of type 2 diabetes, heart disease, colon cancer, and stroke. Health benefits also include weight loss and slowing kidney failure in patients with chronic kidney disease.  DIET GUIDELINES  Limit salt (sodium). Your diet should contain less than 1500 mg of sodium daily.  Limit refined or processed carbohydrates. Your diet should include mostly whole grains. Desserts and added sugars should be used sparingly.  Include small amounts of heart-healthy fats. These types of fats include nuts, oils, and tub margarine. Limit saturated and trans fats. These fats have been shown to be harmful in the body. CHOOSING FOODS  The following food groups are based on a 2000 calorie diet. See your Registered Dietitian for individual calorie needs. Grains and Grain Products (6 to 8 servings daily)  Eat More Often: Whole-wheat bread, brown rice, whole-grain or wheat pasta, quinoa, popcorn without added fat or salt (air popped).  Eat Less Often: White bread, white pasta, white rice, cornbread. Vegetables (4 to 5 servings daily)  Eat More Often: Fresh, frozen, and canned vegetables. Vegetables may be raw, steamed, roasted, or grilled with a minimal amount of fat.  Eat Less Often/Avoid: Creamed or fried vegetables. Vegetables in a cheese sauce. Fruit (4 to 5 servings daily)  Eat More Often: All fresh, canned (in natural juice), or frozen fruits. Dried fruits without added sugar. One hundred percent fruit juice ( cup [237 mL] daily).  Eat Less  Often: Dried fruits with added sugar. Canned fruit in light or heavy syrup. [employer], Fish, and Poultry (2 servings or less daily. One serving is 3 to 4 oz [85-114 g]).  Eat More Often: Ninety percent or leaner ground beef, tenderloin, sirloin. Round cuts of beef, chicken breast, [place] breast. All fish. Grill, bake, or broil your meat. Nothing should be fried.  Eat Less Often/Avoid: Fatty cuts of meat, [place], or chicken leg, thigh, or wing. Fried cuts of meat or fish. Dairy (2 to 3 servings)  Eat More Often: Low-fat or fat-free milk, low-fat plain or light yogurt, reduced-fat or part-skim cheese.  Eat Less Often/Avoid: Milk (whole, 2%).Whole milk yogurt. Full-fat cheeses. Nuts, Seeds, and Legumes (4 to 5 servings per week)  Eat More Often: All without added salt.  Eat Less Often/Avoid: Salted nuts and seeds, canned beans with added salt. Fats and Sweets (limited)  Eat More Often: Vegetable oils, tub margarines without trans fats, sugar-free gelatin. Mayonnaise and salad dressings.  Eat Less Often/Avoid: Coconut oils, palm oils, butter, stick margarine, cream, half and half, cookies, candy, pie. FOR MORE INFORMATION The Dash Diet Eating Plan: www.dashdiet.org Document Released: 03/18/2011 Document Revised: 06/21/2011 Document Reviewed: 03/18/2011 [facility] Patient Information 2013 [place], [place].

## 2012-07-21 LAB — URINE CULTURE: Colony Count: NO GROWTH

## 2012-07-22 NOTE — Progress Notes (Signed)
Patient ID: Gabriel [name], male   DOB: 09/03/1950, 62 y.o.   MRN: [id number] Duplicate note started in error

## 2012-07-23 ENCOUNTER — Encounter: Payer: Self-pay | Admitting: Family Medicine

## 2012-07-23 NOTE — Assessment & Plan Note (Signed)
Well controlled no changes to meds today 

## 2012-07-23 NOTE — Assessment & Plan Note (Signed)
Avoid offending foods and continue Zantac prn

## 2012-07-23 NOTE — Assessment & Plan Note (Signed)
Improving, avoid trans fats, continue current therapy and add a krill oil caps. Minimize simple carbs and saturated fats.

## 2012-07-23 NOTE — Progress Notes (Signed)
Patient ID: Gabriel [name], male   DOB: 28-Nov-1950, 62 y.o.   MRN: [id number] Dash Cardarelli [id number] 10/26/1950 07/23/2012      Progress Note-Follow Up  Subjective  Chief Complaint  Chief Complaint  Patient presents with  . 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 His blood pressure is well-controlled. He's not had recent illness. He reports his reflux has improved and responds to ranitidine. Is trying to keep heart healthy diet and has decreased his by mouth intake. Denies any fevers or chills. No headache, chest pain, palpitations, shortness of breath, GI or GU concerns today.  Past Medical History  Diagnosis Date  . Hypertension   . Esophageal reflux 05/21/2012    Past Surgical History  Procedure Laterality Date  . Esophagus surgery  1999    help with swallowing  . Tonsillectomy and adenoidectomy  1963    Family History  Problem Relation Age of Onset  . Alcohol abuse      mother/father  . Ovarian cancer Mother   . Diabetes Maternal Grandmother     History   Social History  . Marital Status: Married    Spouse Name: N/A    Number of Children: N/A  . Years of Education: N/A   Occupational History  . Not on file.   Social History Main Topics  . Smoking status: Former Games developer  . Smokeless tobacco: Never Used     Comment: 1 ppd for 25 years quit 53'  . Alcohol Use: Yes     Comment: Wine nightly  . Drug Use: No  . Sexually Active: Not on file   Other Topics Concern  . Not on file   Social History Narrative  . No narrative on file    Current Outpatient Prescriptions on File Prior to Visit  Medication Sig Dispense Refill  . enalapril (VASOTEC) 10 MG tablet Take 1 tablet (10 mg total) by mouth daily.  90 tablet  3  . etodolac (LODINE) 500 MG tablet Take 1 tablet (500 mg total) by mouth 2 (two) times daily as needed. Arthritis pain  60 tablet  3  . NON FORMULARY Take by mouth  daily. LifeGuard Antioxidant.      . ranitidine (ZANTAC) 300 MG tablet Take 1 tablet (300 mg total) by mouth at bedtime as needed for heartburn. Only fill if patient requests  30 tablet  3  . Red Yeast Rice Extract 600 MG TABS Take by mouth daily.       No current facility-administered medications on file prior to visit.    No Known Allergies  Review of Systems  Review of Systems  Constitutional: Negative for fever and malaise/fatigue.  HENT: Negative for congestion.   Eyes: Negative for discharge.  Respiratory: Negative for shortness of breath.   Cardiovascular: Negative for chest pain, palpitations and leg swelling.  Gastrointestinal: Negative for nausea, abdominal pain and diarrhea.  Genitourinary: Negative for dysuria.  Musculoskeletal: Negative for falls.  Skin: Negative for rash.  Neurological: Negative for loss of consciousness and headaches.  Endo/Heme/Allergies: Negative for polydipsia.  Psychiatric/Behavioral: Negative for depression and suicidal ideas. The patient is not nervous/anxious and does not have insomnia.     Objective  BP 138/82  Pulse 84  Temp(Src) 98.3 F (36.8 C) (Oral)  Ht 5' 6.5" (1.689 m)  Wt 200 lb 0.6 oz (90.738 kg)  BMI 31.81 kg/m2  SpO2 97%  Physical  Exam  Physical Exam  Constitutional: He is oriented to person, place, and time and well-developed, well-nourished, and in no distress. No distress.  HENT:  Head: Normocephalic and atraumatic.  Eyes: Conjunctivae are normal.  Neck: Neck supple. No thyromegaly present.  Cardiovascular: Normal rate, regular rhythm and normal heart sounds.   No murmur heard. Pulmonary/Chest: Effort normal and breath sounds normal. No respiratory distress.  Abdominal: He exhibits no distension and no mass. There is no tenderness.  Musculoskeletal: He exhibits no edema.  Neurological: He is alert and oriented to person, place, and time.  Skin: Skin is warm.  Psychiatric: Memory, affect and judgment normal.     Lab Results  Component Value Date   TSH 1.205 07/17/2012   Lab Results  Component Value Date   WBC 10.6* 07/17/2012   HGB 15.5 07/17/2012   HCT 45.2 07/17/2012   MCV 90.0 07/17/2012   PLT 283 07/17/2012   Lab Results  Component Value Date   CREATININE 0.93 07/17/2012   BUN 9 07/17/2012   NA 137 07/17/2012   K 5.2 07/17/2012   CL 101 07/17/2012   CO2 27 07/17/2012   Lab Results  Component Value Date   ALT 29 07/17/2012   AST 14 07/17/2012   ALKPHOS 61 07/17/2012   BILITOT 0.5 07/17/2012   Lab Results  Component Value Date   CHOL 174 07/17/2012   Lab Results  Component Value Date   HDL 49 07/17/2012   Lab Results  Component Value Date   LDLCALC 110* 07/17/2012   Lab Results  Component Value Date   TRIG 74 07/17/2012   Lab Results  Component Value Date   CHOLHDL 3.6 07/17/2012     Assessment & Plan  Annual physical exam DOT physical exam form was completed for patient today.   Hyperlipidemia Improving, avoid trans fats, continue current therapy and add a krill oil caps. Minimize simple carbs and saturated fats.  HTN (hypertension) Well controlled no changes to meds today.  Esophageal reflux Avoid offending foods and continue Zantac prn

## 2012-07-23 NOTE — Assessment & Plan Note (Signed)
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

## 2012-07-24 NOTE — Progress Notes (Signed)
Quick Note:  Patient Informed and voiced understanding ______ 

## 2012-10-16 ENCOUNTER — Other Ambulatory Visit: Payer: Self-pay

## 2012-10-16 MED ORDER — ENALAPRIL MALEATE 10 MG PO TABS: 10.0000 mg | ORAL_TABLET | Freq: Every day | ORAL | Status: DC

## 2012-11-08 ENCOUNTER — Telehealth: Payer: Self-pay

## 2012-11-08 DIAGNOSIS — Z0000 Encounter for general adult medical examination without abnormal findings: Secondary | ICD-10-CM

## 2012-11-08 DIAGNOSIS — I10 Essential (primary) hypertension: Secondary | ICD-10-CM

## 2012-11-08 DIAGNOSIS — E785 Hyperlipidemia, unspecified: Secondary | ICD-10-CM

## 2012-11-08 LAB — CBC
HCT: 44.8 % (ref 39.0–52.0)
MCV: 91.6 fL (ref 78.0–100.0)
Platelets: 294 10*3/uL (ref 150–400)
RBC: 4.89 MIL/uL (ref 4.22–5.81)
WBC: 4.9 10*3/uL (ref 4.0–10.5)

## 2012-11-08 NOTE — Telephone Encounter (Signed)
Lab order placed.

## 2012-11-09 LAB — URINALYSIS
Glucose, UA: NEGATIVE mg/dL
Leukocytes, UA: NEGATIVE
Protein, ur: NEGATIVE mg/dL
Specific Gravity, Urine: 1.019 (ref 1.005–1.030)
pH: 6 (ref 5.0–8.0)

## 2012-11-09 LAB — HEPATIC FUNCTION PANEL
ALT: 21 U/L (ref 0–53)
Bilirubin, Direct: 0.2 mg/dL (ref 0.0–0.3)
Indirect Bilirubin: 0.6 mg/dL (ref 0.0–0.9)

## 2012-11-09 LAB — RENAL FUNCTION PANEL
Albumin: 4.5 g/dL (ref 3.5–5.2)
CO2: 26 mEq/L (ref 19–32)
Chloride: 103 mEq/L (ref 96–112)
Potassium: 4.7 mEq/L (ref 3.5–5.3)
Sodium: 138 mEq/L (ref 135–145)

## 2012-11-09 LAB — LIPID PANEL: LDL Cholesterol: 122 mg/dL — ABNORMAL HIGH (ref 0–99)

## 2012-11-09 LAB — HEMOGLOBIN A1C: Mean Plasma Glucose: 114 mg/dL (ref <117)

## 2012-11-14 ENCOUNTER — Encounter: Payer: Self-pay | Admitting: Family Medicine

## 2012-11-14 ENCOUNTER — Ambulatory Visit (INDEPENDENT_AMBULATORY_CARE_PROVIDER_SITE_OTHER): Payer: BC Managed Care – PPO | Admitting: Family Medicine

## 2012-11-14 VITALS — BP 128/78 | HR 80 | Temp 98.3°F | Ht 66.5 in | Wt 191.1 lb

## 2012-11-14 DIAGNOSIS — Z1211 Encounter for screening for malignant neoplasm of colon: Secondary | ICD-10-CM

## 2012-11-14 DIAGNOSIS — I10 Essential (primary) hypertension: Secondary | ICD-10-CM

## 2012-11-14 DIAGNOSIS — E785 Hyperlipidemia, unspecified: Secondary | ICD-10-CM

## 2012-11-14 DIAGNOSIS — E663 Overweight: Secondary | ICD-10-CM

## 2012-11-14 DIAGNOSIS — K219 Gastro-esophageal reflux disease without esophagitis: Secondary | ICD-10-CM

## 2012-11-14 HISTORY — DX: Overweight: E66.3

## 2012-11-14 NOTE — Assessment & Plan Note (Signed)
Mild elevation in ldl, avoid trans fats, increase exercise, add krill oil caps

## 2012-11-14 NOTE — Progress Notes (Signed)
Patient ID: Gabriel [name], male   DOB: 03/18/1951, 62 y.o.   MRN: [id number] Gabriel [name] [id number] [birth year] 11/14/2012      Progress Note-Follow Up  Subjective  Chief Complaint  Chief Complaint  Patient presents with  . Follow-up    4 month    HPI  Patient is a 62 year old Caucasian male who is in today in followup. Generally doing well. Has changed jobs and is moving more. It's been trying to decrease his by mouth intake and is feeling better. He notes his knees and heartburn are better with weight loss. No recent illness. Denies chest pain, palpitations, shortness of breath, GI or GU concerns at today's visit. He is taking medications as prescribed.  Past Medical History  Diagnosis Date  . Hypertension   . Esophageal reflux 05/21/2012  . Overweight 11/14/2012    Past Surgical History  Procedure Laterality Date  . Esophagus surgery  1999    help with swallowing  . Tonsillectomy and adenoidectomy  1963    Family History  Problem Relation Age of Onset  . Alcohol abuse      mother/father  . Ovarian cancer Mother   . Diabetes Maternal Grandmother     History   Social History  . Marital Status: Married    Spouse Name: N/A    Number of Children: N/A  . Years of Education: N/A   Occupational History  . Not on file.   Social History Main Topics  . Smoking status: Former Games developer  . Smokeless tobacco: Never Used     Comment: 1 ppd for 25 years quit 53'  . Alcohol Use: Yes     Comment: Wine nightly  . Drug Use: No  . Sexually Active: Not on file   Other Topics Concern  . Not on file   Social History Narrative  . No narrative on file    Current Outpatient Prescriptions on File Prior to Visit  Medication Sig Dispense Refill  . enalapril (VASOTEC) 10 MG tablet Take 1 tablet (10 mg total) by mouth daily.  90 tablet  2  . etodolac (LODINE) 500 MG tablet Take 1 tablet (500 mg total) by mouth 2 (two) times daily as needed. Arthritis pain  60 tablet  3  .  mupirocin ointment (BACTROBAN) 2 % Apply to b/l nares qhs x 7 days then as needed  22 g  1  . NON FORMULARY Take by mouth daily. LifeGuard Antioxidant.      . ranitidine (ZANTAC) 300 MG tablet Take 1 tablet (300 mg total) by mouth at bedtime as needed for heartburn. Only fill if patient requests  30 tablet  3  . Red Yeast Rice Extract 600 MG TABS Take by mouth daily.       No current facility-administered medications on file prior to visit.    No Known Allergies  Review of Systems  Review of Systems  Constitutional: Negative for fever and malaise/fatigue.  HENT: Negative for congestion.   Eyes: Negative for pain and discharge.  Respiratory: Negative for shortness of breath.   Cardiovascular: Negative for chest pain, palpitations and leg swelling.  Gastrointestinal: Negative for nausea, abdominal pain and diarrhea.  Genitourinary: Negative for dysuria.  Musculoskeletal: Negative for falls.  Skin: Negative for rash.  Neurological: Negative for loss of consciousness and headaches.  Endo/Heme/Allergies: Negative for polydipsia.  Psychiatric/Behavioral: Negative for depression and suicidal ideas. The patient is not nervous/anxious and does not have insomnia.     Objective  BP 128/78  Pulse  80  Temp(Src) 98.3 F (36.8 C) (Oral)  Ht 5' 6.5" (1.689 m)  Wt 191 lb 1.3 oz (86.673 kg)  BMI 30.38 kg/m2  SpO2 98%  Physical Exam  Physical Exam  Constitutional: He is oriented to person, place, and time and well-developed, well-nourished, and in no distress. No distress.  HENT:  Head: Normocephalic and atraumatic.  Eyes: Conjunctivae are normal.  Neck: Neck supple. No thyromegaly present.  Cardiovascular: Normal rate, regular rhythm and normal heart sounds.  Exam reveals no gallop.   No murmur heard. Pulmonary/Chest: Effort normal and breath sounds normal. No respiratory distress.  Abdominal: He exhibits no distension and no mass. There is no tenderness.  Musculoskeletal: He exhibits  no edema.  Neurological: He is alert and oriented to person, place, and time.  Skin: Skin is warm.  Psychiatric: Memory, affect and judgment normal.    Lab Results  Component Value Date   TSH 0.899 11/08/2012   Lab Results  Component Value Date   WBC 4.9 11/08/2012   HGB 15.3 11/08/2012   HCT 44.8 11/08/2012   MCV 91.6 11/08/2012   PLT 294 11/08/2012   Lab Results  Component Value Date   CREATININE 0.86 11/08/2012   BUN 12 11/08/2012   NA 138 11/08/2012   K 4.7 11/08/2012   CL 103 11/08/2012   CO2 26 11/08/2012   Lab Results  Component Value Date   ALT 21 11/08/2012   AST 16 11/08/2012   ALKPHOS 54 11/08/2012   BILITOT 0.8 11/08/2012   Lab Results  Component Value Date   CHOL 187 11/08/2012   Lab Results  Component Value Date   HDL 52 11/08/2012   Lab Results  Component Value Date   LDLCALC 122* 11/08/2012   Lab Results  Component Value Date   TRIG 65 11/08/2012   Lab Results  Component Value Date   CHOLHDL 3.6 11/08/2012     Assessment & Plan  Hyperlipidemia Mild elevation in ldl, avoid trans fats, increase exercise, add krill oil caps  HTN (hypertension) Well controlled no changes today  Esophageal reflux No complaints today, improved some with weight loss  Overweight Good weight loss since last visit, encouraged regular exercise and DASH diet

## 2012-11-14 NOTE — Assessment & Plan Note (Signed)
Well controlled no changes today 

## 2012-11-14 NOTE — Assessment & Plan Note (Signed)
Good weight loss since last visit, encouraged regular exercise and DASH diet

## 2012-11-14 NOTE — Assessment & Plan Note (Signed)
No complaints today, improved some with weight loss

## 2012-11-14 NOTE — Patient Instructions (Addendum)
[facility] in [place] is the alternative group Krill oil caps such as MegaRed daily  Labs prior to next visit, lipid, renal, cbc, tsh, hepatic, hgba1c   Cholesterol Cholesterol is a white, waxy, fat-like protein needed by your body in small amounts. The liver makes all the cholesterol you need. It is carried from the liver by the blood through the blood vessels. Deposits (plaque) may build up on blood vessel walls. This makes the arteries narrower and stiffer. Plaque increases the risk for heart attack and stroke. You cannot feel your cholesterol level even if it is very high. The only way to know is by a blood test to check your lipid (fats) levels. Once you know your cholesterol levels, you should keep a record of the test results. Work with your caregiver to to keep your levels in the desired range. WHAT THE RESULTS MEAN:  Total cholesterol is a rough measure of all the cholesterol in your blood.  LDL is the so-called bad cholesterol. This is the type that deposits cholesterol in the walls of the arteries. You want this level to be low.  HDL is the good cholesterol because it cleans the arteries and carries the LDL away. You want this level to be high.  Triglycerides are fat that the body can either burn for energy or store. High levels are closely linked to heart disease. DESIRED LEVELS:  Total cholesterol below 200.  LDL below 100 for people at risk, below 70 for very high risk.  HDL above 50 is good, above 60 is best.  Triglycerides below 150. HOW TO LOWER YOUR CHOLESTEROL:  Diet.  Choose fish or white meat chicken and [place], roasted or baked. Limit fatty cuts of red meat, fried foods, and processed meats, such as sausage and lunch meat.  Eat lots of fresh fruits and vegetables. Choose whole grains, beans, pasta, potatoes and cereals.  Use only small amounts of olive, corn or canola oils. Avoid butter, mayonnaise, shortening or palm kernel oils. Avoid  foods with trans-fats.  Use skim/nonfat milk and low-fat/nonfat yogurt and cheeses. Avoid whole milk, cream, ice cream, egg yolks and cheeses. Healthy desserts include angel food cake, ginger snaps, animal crackers, hard candy, popsicles, and low-fat/nonfat frozen yogurt. Avoid pastries, cakes, pies and cookies.  Exercise.  A regular program helps decrease LDL and raises HDL.  Helps with weight control.  Do things that increase your activity level like gardening, walking, or taking the stairs.  Medication.  May be prescribed by your caregiver to help lowering cholesterol and the risk for heart disease.  You may need medicine even if your levels are normal if you have several risk factors. HOME CARE INSTRUCTIONS   Follow your diet and exercise programs as suggested by your caregiver.  Take medications as directed.  Have blood work done when your caregiver feels it is necessary. MAKE SURE YOU:   Understand these instructions.  Will watch your condition.  Will get help right away if you are not doing well or get worse. Document Released: 12/22/2000 Document Revised: 06/21/2011 Document Reviewed: 06/14/2007 [facility] [place] Patient Information 2014 [place], [place].

## 2012-11-24 ENCOUNTER — Telehealth: Payer: Self-pay | Admitting: Family Medicine

## 2012-11-24 MED ORDER — ETODOLAC 500 MG PO TABS: 500.0000 mg | ORAL_TABLET | Freq: Two times a day (BID) | ORAL | Status: DC | PRN

## 2012-11-24 NOTE — Telephone Encounter (Signed)
Etodolac 500 mg  Needs refill

## 2012-11-28 ENCOUNTER — Ambulatory Visit: Payer: BC Managed Care – PPO | Admitting: Physician Assistant

## 2012-12-01 ENCOUNTER — Encounter: Payer: Self-pay | Admitting: Internal Medicine

## 2012-12-14 ENCOUNTER — Ambulatory Visit (INDEPENDENT_AMBULATORY_CARE_PROVIDER_SITE_OTHER): Payer: BC Managed Care – PPO | Admitting: Family Medicine

## 2012-12-14 ENCOUNTER — Encounter: Payer: Self-pay | Admitting: Family Medicine

## 2012-12-14 VITALS — BP 130/72 | HR 80 | Temp 98.3°F | Ht 66.5 in | Wt 188.1 lb

## 2012-12-14 DIAGNOSIS — R1310 Dysphagia, unspecified: Secondary | ICD-10-CM

## 2012-12-14 DIAGNOSIS — I10 Essential (primary) hypertension: Secondary | ICD-10-CM

## 2012-12-14 DIAGNOSIS — K220 Achalasia of cardia: Secondary | ICD-10-CM

## 2012-12-14 NOTE — Patient Instructions (Addendum)
Add a probiotic such as Digestive Advantage Switch from fish oil to [employer] such as Mega Red  If epigastric or trouble swallowing recurs start back on the Ranitidine daily    Achalasia Achalasia is a condition in which a person cannot get food through the lower esophagus. This is the tube that carries food from your mouth to your stomach. This happens because there are no nerves to the lower esophagus and the esophageal sphincter. This is the circular muscle between the stomach and esophagus that relaxes to allow food into the stomach. It then contracts to keep food in the stomach. This absence of nerves may be present since birth. This condition causes difficulty swallowing, chest pain, and food coming back up in the mouth after being swallowed. DIAGNOSIS  This condition is diagnosed by x-ray, pressure studies in the esophagus, and endoscopy. This is when your caregiver looks into your esophagus with a small flexible telescope. The portion of the esophagus above the narrowing is usually enlarged when the condition is present. TREATMENT   Soft diets are helpful.  Medications will help the food pass more easily into the stomach.  If conservative treatment (as above) does not work, stretching the end of the esophagus with a balloon may help.  Sometimes surgical treatment is used and a segment of esophagus is removed. SEEK IMMEDIATE MEDICAL CARE IF:  You are unable to keep fluids down or it feels as though food sticks in your chest area.  Vomiting becomes persistent.  Chest or belly pain develops, increases, or localizes.  You have a fever. If problems are continuing and not allowing you to live a normal lifestyle, talk with your caregiver and discuss medical means to help this. Document Released: 01/06/2005 Document Revised: 06/21/2011 Document Reviewed: 04/21/2006 [facility] Patient Information 2014 [place], [place].

## 2012-12-14 NOTE — Progress Notes (Signed)
Patient ID: Gabriel [name], male   DOB: June 01, 1950, 62 y.o.   MRN: [id number] Gabriel [name] [id number] 1951-03-09 12/14/2012      Progress Note-Follow Up  Subjective  Chief Complaint  Chief Complaint  Patient presents with  . Follow-up    HPI  Patient is a 62 year old Caucasian male who is in today for evaluation of dysphasia. He has a distant history of achalasia requiring surgical correction the past. Had been doing very well since his procedure 1999 until about 3 weeks ago when he spent a week to 2 having trouble swallowing even water. He says he was barely able to keep that period of time and now feels fine. Notes his symptoms are worse again he has heartburn when he lies on his left side but is able to PET scan. Says he has to eat slowly. No chest pain or palpitations. No shortness of breath or change in bowel habits.  Past Medical History  Diagnosis Date  . Hypertension   . Esophageal reflux 05/21/2012  . Overweight 11/14/2012    Past Surgical History  Procedure Laterality Date  . Esophagus surgery  1999    help with swallowing  . Tonsillectomy and adenoidectomy  1963    Family History  Problem Relation Age of Onset  . Alcohol abuse      mother/father  . Ovarian cancer Mother   . Diabetes Maternal Grandmother     History   Social History  . Marital Status: Married    Spouse Name: N/A    Number of Children: N/A  . Years of Education: N/A   Occupational History  . Not on file.   Social History Main Topics  . Smoking status: Former Games developer  . Smokeless tobacco: Never Used     Comment: 1 ppd for 25 years quit 54'  . Alcohol Use: Yes     Comment: Wine nightly  . Drug Use: No  . Sexual Activity: Not on file   Other Topics Concern  . Not on file   Social History Narrative  . No narrative on file    Current Outpatient Prescriptions on File Prior to Visit  Medication Sig Dispense Refill  . enalapril (VASOTEC) 10 MG tablet Take 1 tablet (10 mg total) by  mouth daily.  90 tablet  2  . etodolac (LODINE) 500 MG tablet Take 1 tablet (500 mg total) by mouth 2 (two) times daily as needed. Arthritis pain  60 tablet  0  . mupirocin ointment (BACTROBAN) 2 % Apply to b/l nares qhs x 7 days then as needed  22 g  1  . NON FORMULARY Take by mouth daily. LifeGuard Antioxidant.      . ranitidine (ZANTAC) 300 MG tablet Take 1 tablet (300 mg total) by mouth at bedtime as needed for heartburn. Only fill if patient requests  30 tablet  3  . Red Yeast Rice Extract 600 MG TABS Take by mouth daily.       No current facility-administered medications on file prior to visit.    No Known Allergies  Review of Systems  Review of Systems  Constitutional: Negative for fever and malaise/fatigue.  HENT: Negative for congestion.   Eyes: Negative for discharge.  Respiratory: Negative for shortness of breath.   Cardiovascular: Negative for chest pain, palpitations and leg swelling.  Gastrointestinal: Positive for heartburn, nausea and abdominal pain. Negative for vomiting, diarrhea, constipation, blood in stool and melena.  Genitourinary: Negative for dysuria.  Musculoskeletal: Negative for falls.  Skin: Negative  for rash.  Neurological: Negative for loss of consciousness and headaches.  Endo/Heme/Allergies: Negative for polydipsia.  Psychiatric/Behavioral: Negative for depression and suicidal ideas. The patient is not nervous/anxious and does not have insomnia.     Objective  BP 130/72  Pulse 80  Temp(Src) 98.3 F (36.8 C) (Oral)  Ht 5' 6.5" (1.689 m)  Wt 188 lb 1.3 oz (85.313 kg)  BMI 29.91 kg/m2  SpO2 96%  Physical Exam  Physical Exam  Constitutional: He is oriented to person, place, and time and well-developed, well-nourished, and in no distress. No distress.  HENT:  Head: Normocephalic and atraumatic.  Eyes: Conjunctivae are normal.  Neck: Neck supple. No thyromegaly present.  Cardiovascular: Normal rate, regular rhythm and normal heart sounds.    No murmur heard. Pulmonary/Chest: Effort normal and breath sounds normal. No respiratory distress.  Abdominal: He exhibits no distension and no mass. There is no tenderness.  Musculoskeletal: He exhibits no edema.  Neurological: He is alert and oriented to person, place, and time.  Skin: Skin is warm.  Psychiatric: Memory, affect and judgment normal.    Lab Results  Component Value Date   TSH 0.899 11/08/2012   Lab Results  Component Value Date   WBC 4.9 11/08/2012   HGB 15.3 11/08/2012   HCT 44.8 11/08/2012   MCV 91.6 11/08/2012   PLT 294 11/08/2012   Lab Results  Component Value Date   CREATININE 0.86 11/08/2012   BUN 12 11/08/2012   NA 138 11/08/2012   K 4.7 11/08/2012   CL 103 11/08/2012   CO2 26 11/08/2012   Lab Results  Component Value Date   ALT 21 11/08/2012   AST 16 11/08/2012   ALKPHOS 54 11/08/2012   BILITOT 0.8 11/08/2012   Lab Results  Component Value Date   CHOL 187 11/08/2012   Lab Results  Component Value Date   HDL 52 11/08/2012   Lab Results  Component Value Date   LDLCALC 122* 11/08/2012   Lab Results  Component Value Date   TRIG 65 11/08/2012   Lab Results  Component Value Date   CHOLHDL 3.6 11/08/2012     Assessment & Plan  Achalasia Last week could barely swallow water. Now his symptoms have improved. Eat small, frequent meals without spicy or fatty foods. Continue Zantac. Referred to Gastroenterology for further treatment.   HTN (hypertension) Well controlled, no changes

## 2012-12-17 NOTE — Assessment & Plan Note (Signed)
Last week could barely swallow water. Now his symptoms have improved. Eat small, frequent meals without spicy or fatty foods. Continue Zantac. Referred to Gastroenterology for further treatment.

## 2012-12-17 NOTE — Assessment & Plan Note (Signed)
Well controlled, no changes 

## 2012-12-18 ENCOUNTER — Encounter: Payer: Self-pay | Admitting: Internal Medicine

## 2013-01-17 ENCOUNTER — Ambulatory Visit (INDEPENDENT_AMBULATORY_CARE_PROVIDER_SITE_OTHER): Payer: BC Managed Care – PPO | Admitting: Internal Medicine

## 2013-01-17 ENCOUNTER — Encounter: Payer: Self-pay | Admitting: Internal Medicine

## 2013-01-17 VITALS — BP 118/74 | HR 84 | Ht 66.25 in | Wt 191.0 lb

## 2013-01-17 DIAGNOSIS — K220 Achalasia of cardia: Secondary | ICD-10-CM

## 2013-01-17 DIAGNOSIS — R1314 Dysphagia, pharyngoesophageal phase: Secondary | ICD-10-CM

## 2013-01-17 DIAGNOSIS — R1310 Dysphagia, unspecified: Secondary | ICD-10-CM

## 2013-01-17 DIAGNOSIS — Z1211 Encounter for screening for malignant neoplasm of colon: Secondary | ICD-10-CM

## 2013-01-17 MED ORDER — NA SULFATE-K SULFATE-MG SULF 17.5-3.13-1.6 GM/177ML PO SOLN: ORAL | Status: DC

## 2013-01-17 NOTE — Assessment & Plan Note (Addendum)
Current dysphagia problems most likely GERD-related stricture s/p myotomy. Need to investigate w/ EGD and also exclude neoplasia. Plan for possible dilation. The risks and benefits as well as alternatives of endoscopic procedure(s) have been discussed and reviewed. All questions answered. The patient agrees to proceed.

## 2013-01-17 NOTE — Progress Notes (Signed)
Referred by Reuel Derby, MD Subjective:    Patient ID: Gabriel [name], male    DOB: 01-10-1951, 62 y.o.   MRN: [id number]  HPI This man is here with a several month history of dysphagia to solids and liquids that is increasing in frequency. He has a hx of achalasia treated by Heller myotomy 11 years ago.  Dr. Artis Flock at [facility]. Has been without dysphagia until this year. Denies heartburn. Says he tried Nexium but stopped due to diarrhea. If he sleeps on his left side he will regurgitate. GI ROS otherwise negative. Last screening colonoscopy 11 years ago.  Wt Readings from Last 3 Encounters:  01/17/13 191 lb (86.637 kg)  12/14/12 188 lb 1.3 oz (85.313 kg)  11/14/12 191 lb 1.3 oz (86.673 kg)     No Known Allergies Outpatient Prescriptions Prior to Visit  Medication Sig Dispense Refill  . enalapril (VASOTEC) 10 MG tablet Take 1 tablet (10 mg total) by mouth daily.  90 tablet  2  . etodolac (LODINE) 500 MG tablet Take 1 tablet (500 mg total) by mouth 2 (two) times daily as needed. Arthritis pain  60 tablet  0  . mupirocin ointment (BACTROBAN) 2 % Apply to b/l nares qhs x 7 days then as needed  22 g  1  . NON FORMULARY Take by mouth daily. LifeGuard Antioxidant.      . Red Yeast Rice Extract 600 MG TABS Take by mouth daily.      . ranitidine (ZANTAC) 300 MG tablet Take 1 tablet (300 mg total) by mouth at bedtime as needed for heartburn. Only fill if patient requests  30 tablet  3   No facility-administered medications prior to visit.   Past Medical History  Diagnosis Date  . Hypertension   . Esophageal reflux 05/21/2012  . Overweight 11/14/2012  . Hyperlipidemia   . Arthritis   . Achalasia    Past Surgical History  Procedure Laterality Date  . Heller myotomy  1999    help with swallowing  . Tonsillectomy and adenoidectomy  1963  . Colonoscopy    . Esophagogastroduodenoscopy     History   Social History  . Marital Status: Married    Spouse Name: N/A    Number of Children: 2  .  Years of Education: N/A   Occupational History  . sales/distribution    Social History Main Topics  . Smoking status: Former Smoker    Types: Cigarettes    Quit date: 04/13/1991  . Smokeless tobacco: Never Used     Comment: 1 ppd for 25 years quit 65'  . Alcohol Use: Yes     Comment: Wine nightly  . Drug Use: No    Social History Narrative   Married 2 daughters   Sports administrator for [employer]         Family History  Problem Relation Age of Onset  . Alcohol abuse Mother   . Ovarian cancer Mother   . Diabetes Maternal Grandmother   . Alcohol abuse Father   . Lung cancer Father     Review of Systems As per HPI, all other ROS negative    Objective:   Physical Exam General:  Well-developed, well-nourished and in no acute distress Eyes:  anicteric. ENT:   Mouth and posterior pharynx free of lesions.  Neck:   supple w/o thyromegaly or mass.  Lungs: Clear to auscultation bilaterally. Heart:  S1S2, no rubs, murmurs, gallops. Abdomen:  soft, non-tender, no hepatosplenomegaly, hernia, or  mass and BS+.  Rectal: deferred Lymph:  no cervical or supraclavicular adenopathy. Extremities:   no edema Neuro:  A&O x 3.  Psych:  appropriate mood and  Affect.   Data Reviewed: Labs 11/08/12 PCP notes  Assessment & Plan:  Esophageal dysphagia   Achalasia   Colon cancer screening    See problem-oriented charting also  EGD/dilation Screening colonoscopy  The risks and benefits as well as alternatives of endoscopic procedure(s) have been discussed and reviewed. All questions answered. The patient agrees to proceed.  I appreciate the opportunity to care for this patient. RU:[id number], Misty Stanley, MD

## 2013-01-17 NOTE — Patient Instructions (Signed)
You have been scheduled for an endoscopy and colonoscopy with propofol. Please follow the written instructions given to you at your visit today. Please pick up your prep at the pharmacy within the next 1-3 days. If you use inhalers (even only as needed), please bring them with you on the day of your procedure. Your physician has requested that you go to www.startemmi.com and enter the access code given to you at your visit today. This web site gives a general overview about your procedure. However, you should still follow specific instructions given to you by our office regarding your preparation for the procedure.  It is the time of year to have a vaccination to prevent the flu (influenza virus).  Please have this done through your primary care provider or you can get this done at local pharmacies or the [facility]. It would be very helpful if you notify your primary care provider when and where you had the vaccination given by messaging them in My Chart, leaving a message or faxing the information.  I appreciate the opportunity to care for you.  

## 2013-01-24 ENCOUNTER — Other Ambulatory Visit: Payer: Self-pay | Admitting: Family Medicine

## 2013-01-30 ENCOUNTER — Ambulatory Visit (AMBULATORY_SURGERY_CENTER): Payer: BC Managed Care – PPO | Admitting: Internal Medicine

## 2013-01-30 ENCOUNTER — Encounter: Payer: Self-pay | Admitting: Internal Medicine

## 2013-01-30 VITALS — BP 149/86 | HR 77 | Temp 97.4°F | Resp 20 | Ht 66.25 in | Wt 191.0 lb

## 2013-01-30 DIAGNOSIS — N400 Enlarged prostate without lower urinary tract symptoms: Secondary | ICD-10-CM

## 2013-01-30 DIAGNOSIS — K222 Esophageal obstruction: Secondary | ICD-10-CM | POA: Insufficient documentation

## 2013-01-30 DIAGNOSIS — K2210 Ulcer of esophagus without bleeding: Secondary | ICD-10-CM | POA: Insufficient documentation

## 2013-01-30 DIAGNOSIS — D126 Benign neoplasm of colon, unspecified: Secondary | ICD-10-CM

## 2013-01-30 DIAGNOSIS — Z1211 Encounter for screening for malignant neoplasm of colon: Secondary | ICD-10-CM

## 2013-01-30 DIAGNOSIS — K208 Other esophagitis without bleeding: Secondary | ICD-10-CM

## 2013-01-30 DIAGNOSIS — R1314 Dysphagia, pharyngoesophageal phase: Secondary | ICD-10-CM

## 2013-01-30 MED ORDER — PANTOPRAZOLE SODIUM 40 MG PO TBEC: 40.0000 mg | DELAYED_RELEASE_TABLET | Freq: Two times a day (BID) | ORAL | Status: DC

## 2013-01-30 MED ORDER — SODIUM CHLORIDE 0.9 % IV SOLN: 500.0000 mL | INTRAVENOUS | Status: DC

## 2013-01-30 NOTE — Progress Notes (Signed)
NO EGG OR SOY ALLERGY. EMW NO PROBLEMS WITH PAST SEDATION. EWM 

## 2013-01-30 NOTE — Op Note (Signed)
[facility] [address] [place], [address]   ENDOSCOPY PROCEDURE REPORT  PATIENT: Gabriel [name], Gabriel [name]  MR#: [id number] BIRTHDATE: 12/18/1950 , 62  yrs. old GENDER: Male ENDOSCOPIST: Iva Boop, MD, Clementeen Graham REFERRED BY:  Reuel Derby, M.D. PROCEDURE DATE:  01/30/2013 PROCEDURE:  EGD w/ biopsy  and dilation w/ balloon < 30 mm ASA CLASS:     Class II INDICATIONS:  Dysphagia.  Prior Heller myotomy and fundoplication 11 years ago MEDICATIONS: propofol (Diprivan) 150mg  IV, MAC sedation, administered by CRNA, and These medications were titrated to patient response per physician's verbal order TOPICAL ANESTHETIC: Cetacaine Spray DESCRIPTION OF PROCEDURE: After the risks benefits and alternatives of the procedure were thoroughly explained, informed consent was obtained.  The LB [id number] endoscope was introduced through the mouth and advanced to the second portion of the duodenum. Without limitations.  The instrument was slowly withdrawn as the mucosa was fully examined.  ESOPHAGUS: It was dilated and had retained food. 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  The stenosis was traversable with the endoscope.  Multiple biopsies were performed using cold forceps.  STOMACH: Mild gastritis (inflammation) was found in the gastric antrum.   An anti-reflux surgical site was found in the cardia characterized as healthy in appearance.  The remainder of the upper endoscopy exam was otherwise normal. Retroflexed views revealed no other abnormalities.   The stricture was dilated 15, 16.5 and 18 mm with a balloon, appropriate heme seen.   The scope was then withdrawn from the patient and the procedure completed. COMPLICATIONS: There were no complications. ENDOSCOPIC IMPRESSION: 1.   Stricture w/ ulcer  was found at the gastroesophageal junction; multiple biopsies and dilated to 18 mm 2.   Gastritis (inflammation) was found in the gastric  antrum 3.   Anti-reflux surgical site was found in the cardia 4.   The remainder of the upper endoscopy exam was otherwise normal except for dilated esophagus with some retained food (achalasia) RECOMMENDATIONS: 1.  Clear liquids until 4PM , then soft foods rest of day.  Resume prior diet tomorrow. Cut food into small pieces. 2.  PPI bid 3.  Office will call with results/plans  eSigned:  Iva Boop, MD, [facility] 01/30/2013 3:15 PM  [id number] Abner Greenspan, MD and The Patient

## 2013-01-30 NOTE — Progress Notes (Signed)
Patient did not experience any of the following events: a burn prior to discharge; a fall within the facility; wrong site/side/patient/procedure/implant event; or a hospital transfer or hospital admission upon discharge from the facility. (G8907) Patient did not have preoperative order for IV antibiotic SSI prophylaxis. (G8918)  

## 2013-01-30 NOTE — Op Note (Signed)
[facility] [address] [place], [address]   COLONOSCOPY PROCEDURE REPORT  PATIENT: [name], Gabriel  MR#: [id number] BIRTHDATE: 10-May-1950 , 62  yrs. old GENDER: Male ENDOSCOPIST: Iva Boop, MD, [facility] REFERRED [id number] Abner Greenspan, M.D. PROCEDURE DATE:  01/30/2013 PROCEDURE:   Colonoscopy with snare polypectomy First Screening Colonoscopy - Avg.  risk and is 50 yrs.  old or older - No.  Prior Negative Screening - Now for repeat screening. 10 or more years since last screening  History of Adenoma - Now for follow-up colonoscopy & has been > or = to 3 yrs.  N/A  Polyps Removed Today? Yes. ASA CLASS:   Class II INDICATIONS:average risk screening and Last colonoscopy performed 10 years ago. MEDICATIONS: There was residual sedation effect present from prior procedure, propofol (Diprivan) 350mg  IV, MAC sedation, administered by CRNA, and These medications were titrated to patient response per physician's verbal order  DESCRIPTION OF PROCEDURE:   After the risks benefits and alternatives of the procedure were thoroughly explained, informed consent was obtained.  A digital rectal exam revealed no prostatic nodules.   The LB [id number]  endoscope was introduced through the anus and advanced to the cecum, which was identified by both the appendix and ileocecal valve. No adverse events experienced.   The quality of the prep was excellent using Suprep The instrument was then slowly withdrawn as the colon was fully examined.   COLON FINDINGS: A diminutive sessile polyp was found in the descending colon.  A polypectomy was performed with a cold snare. The resection was complete and the polyp tissue was completely retrieved.   The colon mucosa was otherwise normal.   A right colon retroflexion was performed.  Retroflexed views revealed no abnormalities. The time to cecum=2 minutes 05 seconds.  Withdrawal time=8 minutes 02 seconds.  The scope was withdrawn and  the procedure completed. COMPLICATIONS: There were no complications.  ENDOSCOPIC IMPRESSION: 1.   Diminutive sessile polyp was found in the descending colon; polypectomy was performed with a cold snare 2.   The colon mucosa was otherwise normal - excellent prep - second colonoscopy 3.   Enlarged prostate  RECOMMENDATIONS: 1.  Timing of repeat colonoscopy will be determined by pathology findings. 2.   follow-up with primary care or urology re: enlarged prostate.  eSigned:  Iva Boop, MD, [facility] 01/30/2013 3:22 PM   cc: Reuel Derby, MD and The Patient

## 2013-01-30 NOTE — Progress Notes (Signed)
Called to room to assist during endoscopic procedure.  Patient ID and intended procedure confirmed with present staff. Received instructions for my participation in the procedure from the performing physician.  

## 2013-01-30 NOTE — Patient Instructions (Addendum)
I found an ulcer and stricture (narrowing) in the esophagus where it joins the stomach. I think this is from reflux. i took biopsies and dilated (stretched) it. I have prescribed pantoprazole and want you to take it before breakfast and supper to reduce acid damage and allow the ulcer to heal. You will need to stay on this.  I found and removed one tiny and benign-appearing colon polyp.  I will let you know pathology results by phone and schedule follow-up then.  It is the time of year to have a vaccination to prevent the flu (influenza virus). Please have this done through your primary care provider or you can get this done at local pharmacies or the [facility]. It would be very helpful if you notify your primary care provider when and where you had the vaccination given by messaging them in My Chart, leaving a message or faxing the information.   Your prostate is enlarged - nothing suspicious but does seem big. Please have this checked out by your primary care physician and discuss whether you need to see a urologist.  I appreciate the opportunity to care for you. Iva Boop, MD, FACG          YOU HAD AN ENDOSCOPIC PROCEDURE TODAY AT THE [facility]: Refer to the procedure report that was given to you for any specific questions about what was found during the examination.  If the procedure report does not answer your questions, please call your gastroenterologist to clarify.  If you requested that your care partner not be given the details of your procedure findings, then the procedure report has been included in a sealed envelope for you to review at your convenience later.  YOU SHOULD EXPECT: Some feelings of bloating in the abdomen. Passage of more gas than usual.  Walking can help get rid of the air that was put into your GI tract during the procedure and reduce the bloating. If you had a lower endoscopy (such as a colonoscopy or flexible sigmoidoscopy) you may  notice spotting of blood in your stool or on the toilet paper. If you underwent a bowel prep for your procedure, then you may not have a normal bowel movement for a few days.  DIET: FOLLOW DILATION HANDOUT.  ACTIVITY: Your care partner should take you home directly after the procedure.  You should plan to take it easy, moving slowly for the rest of the day.  You can resume normal activity the day after the procedure however you should NOT DRIVE or use heavy machinery for 24 hours (because of the sedation medicines used during the test).    SYMPTOMS TO REPORT IMMEDIATELY: A gastroenterologist can be reached at any hour.  During normal business hours, 8:30 AM to 5:00 PM Monday through Friday, call [phone].  After hours and on weekends, please call the GI answering service at [phone] who will take a message and have the physician on call contact you.   Following lower endoscopy (colonoscopy or flexible sigmoidoscopy):  Excessive amounts of blood in the stool  Significant tenderness or worsening of abdominal pains  Swelling of the abdomen that is new, acute  Fever of 100F or higher  Following upper endoscopy (EGD)  Vomiting of blood or coffee ground material  New chest pain or pain under the shoulder blades  Painful or persistently difficult swallowing  New shortness of breath  Fever of 100F or higher  Black, tarry-looking stools  FOLLOW UP: If any biopsies  were taken you will be contacted by phone or by letter within the next 1-3 weeks.  Call your gastroenterologist if you have not heard about the biopsies in 3 weeks.  Our staff will call the home number listed on your records the next business day following your procedure to check on you and address any questions or concerns that you may have at that time regarding the information given to you following your procedure. This is a courtesy call and so if there is no answer at the home number and we have not heard from you  through the emergency physician on call, we will assume that you have returned to your regular daily activities without incident.  SIGNATURES/CONFIDENTIALITY: You and/or your care partner have signed paperwork which will be entered into your electronic medical record.  These signatures attest to the fact that that the information above on your After Visit Summary has been reviewed and is understood.  Full responsibility of the confidentiality of this discharge information lies with you and/or your care-partner.    Resume medications. Information given on dilation diet, gastritis and polyps with discharge instructions.

## 2013-01-31 ENCOUNTER — Telehealth: Payer: Self-pay

## 2013-01-31 NOTE — Telephone Encounter (Signed)
  Follow up Call-  Call back number 01/30/2013  Post procedure Call Back phone  # [phone]  Permission to leave phone message Yes     Patient questions:  Do you have a fever, pain , or abdominal swelling? no Pain Score  0 *  Have you tolerated food without any problems? yes  Have you been able to return to your normal activities? yes  Do you have any questions about your discharge instructions: Diet   no Medications  no Follow up visit  no  Do you have questions or concerns about your Care? no  Actions: * If pain score is 4 or above: No action needed, pain <4.  No problems per the pt. Maw

## 2013-02-10 ENCOUNTER — Encounter: Payer: Self-pay | Admitting: Internal Medicine

## 2013-02-10 NOTE — Progress Notes (Signed)
Quick Note:  Let him know that GE junction bxs were benign  Colon polyp not a true polyp  Ask how his swallowing is and let me know and arrange REV for December please He will need a routine colonoscopy in 10 yrs - [facility] to post recall - no letter ______

## 2013-02-16 ENCOUNTER — Encounter: Payer: BC Managed Care – PPO | Admitting: Internal Medicine

## 2013-03-21 ENCOUNTER — Encounter: Payer: Self-pay | Admitting: Internal Medicine

## 2013-03-21 ENCOUNTER — Ambulatory Visit (INDEPENDENT_AMBULATORY_CARE_PROVIDER_SITE_OTHER): Payer: BC Managed Care – PPO | Admitting: Internal Medicine

## 2013-03-21 VITALS — BP 110/60 | HR 91 | Ht 65.75 in | Wt 181.4 lb

## 2013-03-21 DIAGNOSIS — K222 Esophageal obstruction: Secondary | ICD-10-CM

## 2013-03-21 DIAGNOSIS — K2210 Ulcer of esophagus without bleeding: Secondary | ICD-10-CM

## 2013-03-21 DIAGNOSIS — K220 Achalasia of cardia: Secondary | ICD-10-CM

## 2013-03-21 DIAGNOSIS — K219 Gastro-esophageal reflux disease without esophagitis: Secondary | ICD-10-CM

## 2013-03-21 NOTE — Assessment & Plan Note (Signed)
Certainly still part of his problem. I explained how I think he has a postoperative complication which is known to occur. He is puzzled and frustrated bile this but I think he understands better now. Plan for repeat EGD and repeat evaluation for diagnostic and possible therapeutic status. Repeating dilation perhaps larger balloon might make sense. He could need upper GI series to evaluate for other issues including slipped fundoplication et Karie Soda.The risks and benefits as well as alternatives of endoscopic procedure(s) have been discussed and reviewed. All questions answered. The patient agrees to proceed. I've explained that he has persistent issues that I cannot help him with we will refer her to a tertiary center where there is an esophageal specialist.

## 2013-03-21 NOTE — Progress Notes (Signed)
Subjective:    Patient ID: Gabriel [name], male    DOB: 11-16-1950, 62 y.o.   MRN: [id number]  HPI The patient returns after he underwent EGD with biopsy and dilation about 2 months ago. He has achalasia and has undergone Heller myotomy and fundoplication procedure. He did well for about a decade after that and then developed dysphagia again. At the endoscopy I saw stricture and ulcer at the GE junction and a dilated esophagus. I dilated at 18 mm after biopsying it, balloon dilation performed. Biopsies benign. I attributed his problems to post fundoplication and myotomy reflux issues. He reports that initially he had a transient benefit but overall he thinks his dysphagia is worse since the dilation. He will intermittent spells were he can feel a food impaction and then have to wait for this to clear and then again he again. He reports weight loss. He is taking pantoprazole 40 mg twice a day.  Wt Readings from Last 3 Encounters:  03/21/13 181 lb 6 oz (82.271 kg)  01/30/13 191 lb (86.637 kg)  01/17/13 191 lb (86.637 kg)   No Known Allergies Outpatient Prescriptions Prior to Visit  Medication Sig Dispense Refill  . enalapril (VASOTEC) 10 MG tablet Take 1 tablet (10 mg total) by mouth daily.  90 tablet  2  . etodolac (LODINE) 500 MG tablet TAKE 1 TABLET (500 MG TOTAL) BY MOUTH 2 (TWO) TIMES DAILY AS NEEDED. ARTHRITIS PAIN  60 tablet  0  . mupirocin ointment (BACTROBAN) 2 % Apply to b/l nares qhs x 7 days then as needed  22 g  1  . NON FORMULARY Take by mouth daily. LifeGuard Antioxidant.      . pantoprazole (PROTONIX) 40 MG tablet Take 1 tablet (40 mg total) by mouth 2 (two) times daily before a meal. 30 minutes before breakfast and supper  60 tablet  3  . Red Yeast Rice Extract 600 MG TABS Take by mouth daily.      . Thiamine HCl (VITAMIN B-1 PO) Take 1 capsule by mouth daily.       No facility-administered medications prior to visit.   Past Medical History  Diagnosis Date  .  Hypertension   . Esophageal reflux 05/21/2012  . Overweight 11/14/2012  . Hyperlipidemia   . Arthritis   . Achalasia    Past Surgical History  Procedure Laterality Date  . Heller myotomy  1999    help with swallowing  . Tonsillectomy and adenoidectomy  1963  . Colonoscopy    . Esophagogastroduodenoscopy    . Upper gastrointestinal endoscopy     History   Social History  . Marital Status: Married    Spouse Name: N/A    Number of Children: 2  . Years of Education: N/A   Occupational History  . sales/distribution    Social History Main Topics  . Smoking status: Former Smoker    Types: Cigarettes    Quit date: 04/13/1991  . Smokeless tobacco: Never Used     Comment: 1 ppd for 25 years quit 32'  . Alcohol Use: 4.2 oz/week    7 Glasses of wine per week     Comment: Wine nightly  . Drug Use: No    Social History Narrative   Married 2 daughters   Sports administrator for [employer]         Review of Systems As above    Objective:   Physical Exam Well-developed nourished  no acute distress    Assessment & Plan:   1. Esophageal stricture   2. Esophageal ulcer   3. GERD (gastroesophageal reflux disease)   4. Achalasia

## 2013-03-21 NOTE — Assessment & Plan Note (Signed)
Continue twice a day PPI, await EGD

## 2013-03-21 NOTE — Assessment & Plan Note (Signed)
Biopsies were benign in October, will reassess. Endoscopy next week.

## 2013-03-21 NOTE — Assessment & Plan Note (Signed)
Is early this is still a problem and not adequately treated. I've advised repeat endoscopy with possible biopsy and dilation.The risks and benefits as well as alternatives of endoscopic procedure(s) have been discussed and reviewed. All questions answered. The patient agrees to proceed.

## 2013-03-21 NOTE — Patient Instructions (Signed)
You have been scheduled for an endoscopy with propofol. Please follow written instructions given to you at your visit today. If you use inhalers (even only as needed), please bring them with you on the day of your procedure.  I appreciate the opportunity to care for you.  

## 2013-03-24 ENCOUNTER — Other Ambulatory Visit: Payer: Self-pay | Admitting: Family Medicine

## 2013-03-26 NOTE — Telephone Encounter (Signed)
Etodolac refilled per protocol. JG//CMA

## 2013-03-29 ENCOUNTER — Encounter: Payer: Self-pay | Admitting: Internal Medicine

## 2013-03-29 ENCOUNTER — Ambulatory Visit (AMBULATORY_SURGERY_CENTER): Payer: BC Managed Care – PPO | Admitting: Internal Medicine

## 2013-03-29 VITALS — BP 175/94 | HR 72 | Temp 97.1°F | Resp 20 | Ht 65.0 in | Wt 181.0 lb

## 2013-03-29 DIAGNOSIS — K220 Achalasia of cardia: Secondary | ICD-10-CM

## 2013-03-29 DIAGNOSIS — K219 Gastro-esophageal reflux disease without esophagitis: Secondary | ICD-10-CM

## 2013-03-29 DIAGNOSIS — K222 Esophageal obstruction: Secondary | ICD-10-CM

## 2013-03-29 MED ORDER — SODIUM CHLORIDE 0.9 % IV SOLN: 500.0000 mL | INTRAVENOUS | Status: DC

## 2013-03-29 NOTE — Progress Notes (Signed)
No egg or soy allergy. ewm No problems with past sedation. ewm 

## 2013-03-29 NOTE — Progress Notes (Addendum)
Pt sitting in high fowlers position. States he is having significant epigastric pain 6.5/10. Dr. Leone Payor in room and aware and believes it may be some trapped air. Pt spitting up clear saliva, clearing throat frequently. Pt. Has passed some air with belching and feeling tremendously better. Small amounts of water swallowed and tolerated. Dr. Leone Payor aware and allowing pt to be discharges.

## 2013-03-29 NOTE — Progress Notes (Signed)
Called to room to assist during endoscopic procedure.  Patient ID and intended procedure confirmed with present staff. Received instructions for my participation in the procedure from the performing physician.  

## 2013-03-29 NOTE — Op Note (Addendum)
[facility] [address] [place], [address]   ENDOSCOPY PROCEDURE REPORT  PATIENT: Gabriel, [name]  MR#: [id number] BIRTHDATE: [birth year] , 62  yrs. old GENDER: Male ENDOSCOPIST: Iva Boop, MD, [facility] PROCEDURE DATE:  03/29/2013 PROCEDURE:  EGD w/ biopsy ASA CLASS:     Class II INDICATIONS:  Dysphagia.  Achalasia, Esophageal stricture from GERD MEDICATIONS: propofol (Diprivan) 400mg  IV, MAC sedation, administered by CRNA, and These medications were titrated to patient response per physician's verbal order TOPICAL ANESTHETIC: Cetacaine Spray  DESCRIPTION OF PROCEDURE: After the risks benefits and alternatives of the procedure were thoroughly explained, informed consent was obtained.  The    endoscope was introduced through the mouth and advanced to the gastroesophageal junction. Without limitations. The instrument was slowly withdrawn as the mucosa was fully examined.        ESOPHAGUS: The esophagus was dilated and filled with food and liquid - suctioned. A stricture was found at the gastroesophageal junction.  The stenosis was non traversable with the endoscope. ? extrinsic expression.  Multiple biopsies were performed using cold forceps.  Sample sent for histology.  Retroflexion was not performed in stomach but scope retroflexed in the distal esophagus. The scope was then withdrawn from the patient and the procedure completed.  COMPLICATIONS: There were no complications. ENDOSCOPIC IMPRESSION: Stricture was found at the gastroesophageal junction; multiple biopsies - esophagitis healed. ? Extrinsic compression. Could not enter the stomach.  RECOMMENDATIONS: Upper GI Series to be scheduled Liquid diet is best at this point.  he had discomfort in upper abdomen and chest in recovery, tympanitic upper abdomen - soft and non-tender - with belching and time was better - suspect trapped gastric air   eSigned:  Iva Boop, MD, [facility] 03/29/2013  5:05 PM Revised: 03/29/2013 5:05 PM  XB:JYNWG Abner Greenspan, MD and The Patient

## 2013-03-29 NOTE — Progress Notes (Signed)
Lidocaine-40mg IV prior to Propofol InductionPropofol given over incremental dosages 

## 2013-03-29 NOTE — Patient Instructions (Addendum)
The inflammation was healed but the scope would not pass through into the stomach. I want you to have an upper GI test - ? If something from prior surgery has slipped and is causing problems. A liquid diet makes sense right now.  My office will schedule this and call you about it.  I appreciate the opportunity to care for you. Iva Boop, MD, FACG  YOU HAD AN ENDOSCOPIC PROCEDURE TODAY AT THE [facility]: Refer to the procedure report that was given to you for any specific questions about what was found during the examination.  If the procedure report does not answer your questions, please call your gastroenterologist to clarify.  If you requested that your care partner not be given the details of your procedure findings, then the procedure report has been included in a sealed envelope for you to review at your convenience later.  YOU SHOULD EXPECT: Some feelings of bloating in the abdomen. Passage of more gas than usual.  Walking can help get rid of the air that was put into your GI tract during the procedure and reduce the bloating. If you had a lower endoscopy (such as a colonoscopy or flexible sigmoidoscopy) you may notice spotting of blood in your stool or on the toilet paper. If you underwent a bowel prep for your procedure, then you may not have a normal bowel movement for a few days.  DIET: Your first meal following the procedure should be a light meal and then it is ok to progress to your normal diet.  A half-sandwich or bowl of soup is an example of a good first meal.  Heavy or fried foods are harder to digest and may make you feel nauseous or bloated.  Likewise meals heavy in dairy and vegetables can cause extra gas to form and this can also increase the bloating.  Drink plenty of fluids but you should avoid alcoholic beverages for 24 hours.  ACTIVITY: Your care partner should take you home directly after the procedure.  You should plan to take it easy, moving slowly for the  rest of the day.  You can resume normal activity the day after the procedure however you should NOT DRIVE or use heavy machinery for 24 hours (because of the sedation medicines used during the test).    SYMPTOMS TO REPORT IMMEDIATELY: A gastroenterologist can be reached at any hour.  During normal business hours, 8:30 AM to 5:00 PM Monday through Friday, call [phone].  After hours and on weekends, please call the GI answering service at [phone] who will take a message and have the physician on call contact you.   Following upper endoscopy (EGD)  Vomiting of blood or coffee ground material  New chest pain or pain under the shoulder blades  Painful or persistently difficult swallowing  New shortness of breath  Fever of 100F or higher  Black, tarry-looking stools  FOLLOW UP: If any biopsies were taken you will be contacted by phone or by letter within the next 1-3 weeks.  Call your gastroenterologist if you have not heard about the biopsies in 3 weeks.  Our staff will call the home number listed on your records the next business day following your procedure to check on you and address any questions or concerns that you may have at that time regarding the information given to you following your procedure. This is a courtesy call and so if there is no answer at the home number and we have  heard from you through the emergency physician on call, we will assume that you have returned to your regular daily activities without incident.  SIGNATURES/CONFIDENTIALITY: You and/or your care partner have signed paperwork which will be entered into your electronic medical record.  These signatures attest to the fact that that the information above on your After Visit Summary has been reviewed and is understood.  Full responsibility of the confidentiality of this discharge information lies with you and/or your care-partner. 

## 2013-03-30 ENCOUNTER — Other Ambulatory Visit: Payer: Self-pay

## 2013-03-30 ENCOUNTER — Telehealth: Payer: Self-pay

## 2013-03-30 DIAGNOSIS — R4702 Dysphasia: Secondary | ICD-10-CM

## 2013-03-30 DIAGNOSIS — K220 Achalasia of cardia: Secondary | ICD-10-CM

## 2013-03-30 NOTE — Progress Notes (Signed)
Patient is scheduled for a UGI 04/02/13 10:30 @ [facility].  He is advised to be NPO

## 2013-03-30 NOTE — Telephone Encounter (Signed)
  Follow up Call-  Call back number 03/29/2013 01/30/2013  Post procedure Call Back phone  # [phone]  Permission to leave phone message Yes Yes     Patient questions:  Do you have a fever, pain , or abdominal swelling? no Pain Score  0 *  Have you tolerated food without any problems? yes  Have you been able to return to your normal activities? yes  Do you have any questions about your discharge instructions: Diet   no Medications  no Follow up visit  no  Do you have questions or concerns about your Care? no  Actions: * If pain score is 4 or above: No action needed, pain <4.

## 2013-04-02 ENCOUNTER — Ambulatory Visit (HOSPITAL_COMMUNITY)
Admission: RE | Admit: 2013-04-02 | Discharge: 2013-04-02 | Disposition: A | Discharge status: Home / Self Care | Payer: BC Managed Care – PPO | Source: Ambulatory Visit | Attending: Internal Medicine | Admitting: Internal Medicine

## 2013-04-02 DIAGNOSIS — R4702 Dysphasia: Secondary | ICD-10-CM

## 2013-04-02 DIAGNOSIS — K220 Achalasia of cardia: Secondary | ICD-10-CM | POA: Insufficient documentation

## 2013-04-02 DIAGNOSIS — K2980 Duodenitis without bleeding: Secondary | ICD-10-CM | POA: Insufficient documentation

## 2013-04-03 NOTE — Progress Notes (Signed)
Quick Note:  I left him a messager Re: results and plans  1) Please get him an appointment - fast as possible - with Dr. Rudean Curt, Doristine Section or Alvia Grove at [facility]  2) problem is achalasia, recurrent problems with stenosis, dysphagia and weight loss 11 years after Heller myotomy  3) Let me know name, date of appt ______

## 2013-04-10 NOTE — Progress Notes (Signed)
Quick Note:  Biopsies negative Communicated to patient No letter or recall ______

## 2013-04-11 ENCOUNTER — Telehealth: Payer: Self-pay

## 2013-04-11 NOTE — Telephone Encounter (Signed)
Notes and referral sent to [facility] for appt for achalasia.  I have left a message for the patient that he should expect a phone call from [facility] directly with an appt.

## 2013-04-16 NOTE — Telephone Encounter (Signed)
Patient advised that I am waiting to hear back.  I have left a voicemail today with [facility], but haven't received a return call.  He is aware I will keep trying to get in touch with [facility].

## 2013-04-17 NOTE — Telephone Encounter (Signed)
I spoke with [facility].  They are in the process of calling the patient for an appt date and time.

## 2013-04-19 ENCOUNTER — Other Ambulatory Visit: Payer: Self-pay

## 2013-04-19 NOTE — Telephone Encounter (Signed)
Patient is scheduled to see Dr. Villa Herb 05/02/13.

## 2013-04-20 ENCOUNTER — Ambulatory Visit: Payer: BC Managed Care – PPO | Admitting: Family Medicine

## 2013-04-23 ENCOUNTER — Telehealth: Payer: Self-pay | Admitting: Internal Medicine

## 2013-04-23 NOTE — Telephone Encounter (Signed)
All questions answered.  He wants directions to [facility].  I have provided him the number and asked that he call them directly for clinic directions.

## 2013-04-24 ENCOUNTER — Encounter: Payer: Self-pay | Admitting: Internal Medicine

## 2013-05-02 DIAGNOSIS — R634 Abnormal weight loss: Secondary | ICD-10-CM | POA: Insufficient documentation

## 2013-05-09 ENCOUNTER — Other Ambulatory Visit: Payer: Self-pay | Admitting: Family Medicine

## 2013-06-11 ENCOUNTER — Encounter: Payer: Self-pay | Admitting: Internal Medicine

## 2013-06-20 ENCOUNTER — Other Ambulatory Visit: Payer: Self-pay | Admitting: Internal Medicine

## 2013-07-11 ENCOUNTER — Encounter: Payer: Self-pay | Admitting: Internal Medicine

## 2013-07-11 NOTE — Telephone Encounter (Signed)
This encounter was created in error - please disregard.

## 2013-07-18 ENCOUNTER — Telehealth: Payer: Self-pay | Admitting: Family Medicine

## 2013-07-18 MED ORDER — ENALAPRIL MALEATE 10 MG PO TABS: 10.0000 mg | ORAL_TABLET | Freq: Every day | ORAL | Status: DC

## 2013-07-18 MED ORDER — ETODOLAC 500 MG PO TABS: ORAL_TABLET | ORAL | Status: DC

## 2013-07-18 NOTE — Telephone Encounter (Signed)
Also requesting refill on Etodolac 500mg 

## 2013-07-18 NOTE — Telephone Encounter (Signed)
Refills sent to pharmacy. Pt was due for follow up with Dr Charlett Blake in March.  Please call pt to arrange appt.

## 2013-07-18 NOTE — Telephone Encounter (Signed)
Requesting refill on Enalapril maleate 10mg 

## 2013-07-18 NOTE — Telephone Encounter (Signed)
Informed patient of medication refills and he scheduled appointment for 08/06/13

## 2013-07-20 ENCOUNTER — Encounter: Payer: Self-pay | Admitting: Family Medicine

## 2013-08-06 ENCOUNTER — Encounter: Payer: Self-pay | Admitting: Family Medicine

## 2013-08-06 ENCOUNTER — Ambulatory Visit (INDEPENDENT_AMBULATORY_CARE_PROVIDER_SITE_OTHER): Payer: BC Managed Care – PPO | Admitting: Family Medicine

## 2013-08-06 VITALS — HR 81 | Temp 97.9°F | Ht 66.5 in | Wt 180.0 lb

## 2013-08-06 DIAGNOSIS — E785 Hyperlipidemia, unspecified: Secondary | ICD-10-CM

## 2013-08-06 DIAGNOSIS — I10 Essential (primary) hypertension: Secondary | ICD-10-CM

## 2013-08-06 DIAGNOSIS — K222 Esophageal obstruction: Secondary | ICD-10-CM

## 2013-08-06 DIAGNOSIS — E663 Overweight: Secondary | ICD-10-CM

## 2013-08-06 NOTE — Progress Notes (Signed)
Pre visit review using our clinic review tool, if applicable. No additional management support is needed unless otherwise documented below in the visit note. 

## 2013-08-06 NOTE — Patient Instructions (Signed)

## 2013-08-12 ENCOUNTER — Encounter: Payer: Self-pay | Admitting: Family Medicine

## 2013-08-12 NOTE — Assessment & Plan Note (Signed)
Encouraged heart healthy diet, increase exercise, avoid trans fats, consider a krill oil cap daily 

## 2013-08-12 NOTE — Assessment & Plan Note (Signed)
Still struggling with stenosis but is asymptomatic at this time as long as he eats small bites and chews well.

## 2013-08-12 NOTE — Assessment & Plan Note (Signed)
Improved some on recheck. encouraged DASH diet, minimize caffeine and obtain adequate sleep. Report concerning symptoms and follow up as directed and as needed

## 2013-08-12 NOTE — Progress Notes (Signed)
Patient ID: Gabriel [name], male   DOB: 01/01/1951, 63 y.o.   MRN: [id number] Gabriel [name] [id number] [birth year] 08/12/2013      Progress Note-Follow Up  Subjective  Chief Complaint  Chief Complaint  Patient presents with  . Medication Refill    HPI  Patient is a 63 year old male in today for routine medical care. He is doing somewhat better with regards to his GI issues. He is noting that is dilatation of his esophagus was only partially successful but his symptoms are much better. His lungs he does not eat too heavy of a meal he does better. He does get a heavy sensation but no pain at times. Continues to struggle with her shoulder pain on the right and hip pain on the left. This is worse with walking and he is following with pain management. No other recent new complaints. Denies CP/palp/SOB/HA/congestion/fevers/GI or GU c/o. Taking meds as prescribed  Past Medical History  Diagnosis Date  . Hypertension   . Esophageal reflux 05/21/2012  . Overweight 11/14/2012  . Hyperlipidemia   . Arthritis   . Achalasia   . Ulcer     esophageal ulcer hx    Past Surgical History  Procedure Laterality Date  . Heller myotomy  1999    help with swallowing  . Tonsillectomy and adenoidectomy  1963  . Colonoscopy    . Esophagogastroduodenoscopy    . Upper gastrointestinal endoscopy      Family History  Problem Relation Age of Onset  . Alcohol abuse Mother   . Ovarian cancer Mother   . Diabetes Maternal Grandmother   . Alcohol abuse Father   . Lung cancer Father   . Colon cancer Neg Hx   . Esophageal cancer Neg Hx   . Rectal cancer Neg Hx   . Stomach cancer Neg Hx     History   Social History  . Marital Status: Married    Spouse Name: N/A    Number of Children: 2  . Years of Education: N/A   Occupational History  . sales/distribution    Social History Main Topics  . Smoking status: Former Smoker    Types: Cigarettes    Quit date: 04/13/1991  . Smokeless tobacco: Never  Used     Comment: 1 ppd for 25 years quit 14'  . Alcohol Use: 4.2 oz/week    7 Glasses of wine per week     Comment: Wine nightly  . Drug Use: No  . Sexual Activity: Not on file   Other Topics Concern  . Not on file   Social History Narrative   Married 2 daughters   Scientific laboratory technician for [employer]          Current Outpatient Prescriptions on File Prior to Visit  Medication Sig Dispense Refill  . enalapril (VASOTEC) 10 MG tablet Take 1 tablet (10 mg total) by mouth daily.  90 tablet  0  . etodolac (LODINE) 500 MG tablet TAKE 1 TABLET (500 MG TOTAL) BY MOUTH 2 (TWO) TIMES DAILY AS NEEDED FOR ARTHRITIS PAIN  60 tablet  0  . mupirocin ointment (BACTROBAN) 2 % Apply to b/l nares qhs x 7 days then as needed  22 g  1  . NON FORMULARY Take by mouth daily. LifeGuard Antioxidant.      . pantoprazole (PROTONIX) 40 MG tablet TAKE 1 TABLET BY MOUTH 2 TIMES DAILY BEFORE A MEAL-30 MINUTES BEFORE BREAKFAST AND SUPPER  60 tablet  6  .  Red Yeast Rice Extract 600 MG TABS Take by mouth daily.      . Thiamine HCl (VITAMIN B-1 PO) Take 1 capsule by mouth daily.       No current facility-administered medications on file prior to visit.    No Known Allergies  Review of Systems  Review of Systems  Constitutional: Negative for fever, chills and malaise/fatigue.  HENT: Negative for congestion, hearing loss and nosebleeds.   Eyes: Negative for discharge.  Respiratory: Negative for cough, sputum production, shortness of breath and wheezing.   Cardiovascular: Negative for chest pain, palpitations and leg swelling.  Gastrointestinal: Negative for heartburn, nausea, vomiting, abdominal pain, diarrhea, constipation and blood in stool.  Genitourinary: Negative for dysuria, urgency, frequency and hematuria.  Musculoskeletal: Positive for joint pain. Negative for back pain, falls and myalgias.       Right shoulder pain and left hip pain which is worse with walking is following with pain  management at [facility] Dr Laddie Aquas  Skin: Negative for rash.  Neurological: Negative for dizziness, tremors, sensory change, focal weakness, loss of consciousness, weakness and headaches.  Endo/Heme/Allergies: Negative for polydipsia. Does not bruise/bleed easily.  Psychiatric/Behavioral: Negative for depression and suicidal ideas. The patient is not nervous/anxious and does not have insomnia.     Objective  Pulse 81  Temp(Src) 97.9 F (36.6 C) (Oral)  Ht 5' 6.5" (1.689 m)  Wt 180 lb 0.6 oz (81.666 kg)  BMI 28.63 kg/m2  SpO2 97%  Physical Exam  Physical Exam  Constitutional: He is oriented to person, place, and time and well-developed, well-nourished, and in no distress. No distress.  HENT:  Head: Normocephalic and atraumatic.  Eyes: Conjunctivae are normal.  Neck: Neck supple. No thyromegaly present.  Cardiovascular: Normal rate, regular rhythm and normal heart sounds.   No murmur heard. Pulmonary/Chest: Effort normal and breath sounds normal. No respiratory distress.  Abdominal: He exhibits no distension and no mass. There is no tenderness.  Musculoskeletal: He exhibits no edema.  Neurological: He is alert and oriented to person, place, and time.  Skin: Skin is warm.  Psychiatric: Memory, affect and judgment normal.    Lab Results  Component Value Date   TSH 0.899 11/08/2012   Lab Results  Component Value Date   WBC 4.9 11/08/2012   HGB 15.3 11/08/2012   HCT 44.8 11/08/2012   MCV 91.6 11/08/2012   PLT 294 11/08/2012   Lab Results  Component Value Date   CREATININE 0.86 11/08/2012   BUN 12 11/08/2012   NA 138 11/08/2012   K 4.7 11/08/2012   CL 103 11/08/2012   CO2 26 11/08/2012   Lab Results  Component Value Date   ALT 21 11/08/2012   AST 16 11/08/2012   ALKPHOS 54 11/08/2012   BILITOT 0.8 11/08/2012   Lab Results  Component Value Date   CHOL 187 11/08/2012   Lab Results  Component Value Date   HDL 52 11/08/2012   Lab Results  Component Value Date   LDLCALC 122*  11/08/2012   Lab Results  Component Value Date   TRIG 65 11/08/2012   Lab Results  Component Value Date   CHOLHDL 3.6 11/08/2012     Assessment & Plan  HTN (hypertension) Improved some on recheck. encouraged DASH diet, minimize caffeine and obtain adequate sleep. Report concerning symptoms and follow up as directed and as needed  Esophageal stricture Still struggling with stenosis but is asymptomatic at this time as long as he eats small bites and chews  well.   Hyperlipidemia Encouraged heart healthy diet, increase exercise, avoid trans fats, consider a krill oil cap daily

## 2013-09-24 ENCOUNTER — Other Ambulatory Visit: Payer: Self-pay | Admitting: Family Medicine

## 2013-09-24 ENCOUNTER — Encounter: Payer: Self-pay | Admitting: Family Medicine

## 2013-09-25 NOTE — Telephone Encounter (Signed)
Patient last seen 08/06/13.  Next OV 01/08/14.   Last Rx was dated 07/18/13 # 60 no rfs.  Unsure if I can fill this.  Please advise.

## 2013-10-22 ENCOUNTER — Other Ambulatory Visit: Payer: Self-pay | Admitting: Family Medicine

## 2013-12-04 ENCOUNTER — Other Ambulatory Visit: Payer: Self-pay

## 2013-12-04 MED ORDER — ETODOLAC 500 MG PO TABS: 500.0000 mg | ORAL_TABLET | Freq: Two times a day (BID) | ORAL | Status: DC

## 2014-01-08 ENCOUNTER — Encounter: Payer: Self-pay | Admitting: Family Medicine

## 2014-01-08 ENCOUNTER — Ambulatory Visit (INDEPENDENT_AMBULATORY_CARE_PROVIDER_SITE_OTHER): Payer: BC Managed Care – PPO | Admitting: Family Medicine

## 2014-01-08 VITALS — BP 127/75 | HR 82 | Temp 98.4°F | Ht 66.5 in | Wt 186.4 lb

## 2014-01-08 DIAGNOSIS — G4700 Insomnia, unspecified: Secondary | ICD-10-CM

## 2014-01-08 DIAGNOSIS — N529 Male erectile dysfunction, unspecified: Secondary | ICD-10-CM

## 2014-01-08 DIAGNOSIS — R7309 Other abnormal glucose: Secondary | ICD-10-CM | POA: Diagnosis not present

## 2014-01-08 DIAGNOSIS — L259 Unspecified contact dermatitis, unspecified cause: Secondary | ICD-10-CM

## 2014-01-08 DIAGNOSIS — I10 Essential (primary) hypertension: Secondary | ICD-10-CM | POA: Diagnosis not present

## 2014-01-08 DIAGNOSIS — E663 Overweight: Secondary | ICD-10-CM

## 2014-01-08 DIAGNOSIS — M25559 Pain in unspecified hip: Secondary | ICD-10-CM

## 2014-01-08 DIAGNOSIS — L309 Dermatitis, unspecified: Secondary | ICD-10-CM

## 2014-01-08 DIAGNOSIS — E785 Hyperlipidemia, unspecified: Secondary | ICD-10-CM

## 2014-01-08 DIAGNOSIS — R739 Hyperglycemia, unspecified: Secondary | ICD-10-CM

## 2014-01-08 DIAGNOSIS — M25552 Pain in left hip: Secondary | ICD-10-CM

## 2014-01-08 LAB — CBC
HCT: 44.5 % (ref 39.0–52.0)
Hemoglobin: 15.1 g/dL (ref 13.0–17.0)
MCHC: 34 g/dL (ref 30.0–36.0)
MCV: 93.3 fl (ref 78.0–100.0)
Platelets: 271 10*3/uL (ref 150.0–400.0)
RBC: 4.76 Mil/uL (ref 4.22–5.81)
RDW: 13.4 % (ref 11.5–15.5)
WBC: 5.8 10*3/uL (ref 4.0–10.5)

## 2014-01-08 LAB — LIPID PANEL
CHOLESTEROL: 217 mg/dL — AB (ref 0–200)
HDL: 55 mg/dL (ref >39.00)
LDL CALC: 153 mg/dL — AB (ref 0–99)
NonHDL: 162
Total CHOL/HDL Ratio: 4
Triglycerides: 45 mg/dL (ref 0.0–149.0)
VLDL: 9 mg/dL (ref 0.0–40.0)

## 2014-01-08 LAB — RENAL FUNCTION PANEL
Albumin: 4.5 g/dL (ref 3.5–5.2)
BUN: 13 mg/dL (ref 6–23)
CO2: 29 mEq/L (ref 19–32)
Calcium: 9.3 mg/dL (ref 8.4–10.5)
Chloride: 103 mEq/L (ref 96–112)
Creatinine, Ser: 1 mg/dL (ref 0.4–1.5)
GFR: 84.35 mL/min (ref >60.00)
GLUCOSE: 109 mg/dL — AB (ref 70–99)
PHOSPHORUS: 3.6 mg/dL (ref 2.3–4.6)
POTASSIUM: 4.9 meq/L (ref 3.5–5.1)
SODIUM: 138 meq/L (ref 135–145)

## 2014-01-08 LAB — HEPATIC FUNCTION PANEL
ALT: 21 U/L (ref 0–53)
AST: 18 U/L (ref 0–37)
Albumin: 4.5 g/dL (ref 3.5–5.2)
Alkaline Phosphatase: 47 U/L (ref 39–117)
BILIRUBIN DIRECT: 0.1 mg/dL (ref 0.0–0.3)
BILIRUBIN TOTAL: 0.9 mg/dL (ref 0.2–1.2)
Total Protein: 7.4 g/dL (ref 6.0–8.3)

## 2014-01-08 LAB — TSH: TSH: 0.75 u[IU]/mL (ref 0.35–4.50)

## 2014-01-08 LAB — HEMOGLOBIN A1C: Hgb A1c MFr Bld: 5.6 % (ref 4.6–6.5)

## 2014-01-08 MED ORDER — ETODOLAC 500 MG PO TABS: 500.0000 mg | ORAL_TABLET | Freq: Two times a day (BID) | ORAL | Status: DC

## 2014-01-08 MED ORDER — ENALAPRIL MALEATE 10 MG PO TABS: 10.0000 mg | ORAL_TABLET | Freq: Every day | ORAL | Status: DC

## 2014-01-08 MED ORDER — SILDENAFIL CITRATE 20 MG PO TABS: ORAL_TABLET | ORAL | Status: DC

## 2014-01-08 MED ORDER — PIMECROLIMUS 1 % EX CREA: 1.0000 "application " | TOPICAL_CREAM | Freq: Every day | CUTANEOUS | Status: AC

## 2014-01-08 NOTE — Patient Instructions (Signed)
[place], by [email or web address]  Hypertension Hypertension, commonly called high blood pressure, is when the force of blood pumping through your arteries is too strong. Your arteries are the blood vessels that carry blood from your heart throughout your body. A blood pressure reading consists of a higher number over a lower number, such as 110/72. The higher number (systolic) is the pressure inside your arteries when your heart pumps. The lower number (diastolic) is the pressure inside your arteries when your heart relaxes. Ideally you want your blood pressure below 120/80. Hypertension forces your heart to work harder to pump blood. Your arteries may become narrow or stiff. Having hypertension puts you at risk for heart disease, stroke, and other problems.  RISK FACTORS Some risk factors for high blood pressure are controllable. Others are not.  Risk factors you cannot control include:   Race. You may be at higher risk if you are African American.  Age. Risk increases with age.  Gender. Men are at higher risk than women before age 27 years. After age 61, women are at higher risk than men. Risk factors you can control include:  Not getting enough exercise or physical activity.  Being overweight.  Getting too much fat, sugar, calories, or salt in your diet.  Drinking too much alcohol. SIGNS AND SYMPTOMS Hypertension does not usually cause signs or symptoms. Extremely high blood pressure (hypertensive crisis) may cause headache, anxiety, shortness of breath, and nosebleed. DIAGNOSIS  To check if you have hypertension, your health care provider will measure your blood pressure while you are seated, with your arm held at the level of your heart. It should be measured at least twice using the same arm. Certain conditions can cause a difference in blood pressure between your right and left arms. A blood pressure reading that is higher than normal on one occasion does not mean that you need  treatment. If one blood pressure reading is high, ask your health care provider about having it checked again. TREATMENT  Treating high blood pressure includes making lifestyle changes and possibly taking medicine. Living a healthy lifestyle can help lower high blood pressure. You may need to change some of your habits. Lifestyle changes may include:  Following the DASH diet. This diet is high in fruits, vegetables, and whole grains. It is low in salt, red meat, and added sugars.  Getting at least 2 hours of brisk physical activity every week.  Losing weight if necessary.  Not smoking.  Limiting alcoholic beverages.  Learning ways to reduce stress. If lifestyle changes are not enough to get your blood pressure under control, your health care provider may prescribe medicine. You may need to take more than one. Work closely with your health care provider to understand the risks and benefits. HOME CARE INSTRUCTIONS  Have your blood pressure rechecked as directed by your health care provider.   Take medicines only as directed by your health care provider. Follow the directions carefully. Blood pressure medicines must be taken as prescribed. The medicine does not work as well when you skip doses. Skipping doses also puts you at risk for problems.   Do not smoke.   Monitor your blood pressure at home as directed by your health care provider. SEEK MEDICAL CARE IF:   You think you are having a reaction to medicines taken.  You have recurrent headaches or feel dizzy.  You have swelling in your ankles.  You have trouble with your vision. SEEK IMMEDIATE MEDICAL CARE IF:  You develop  a severe headache or confusion.  You have unusual weakness, numbness, or feel faint.  You have severe chest or abdominal pain.  You vomit repeatedly.  You have trouble breathing. MAKE SURE YOU:   Understand these instructions.  Will watch your condition.  Will get help right away if you are  not doing well or get worse. Document Released: 03/29/2005 Document Revised: 08/13/2013 Document Reviewed: 01/19/2013 [facility] Patient Information 2015 [place], [place]. This information is not intended to replace advice given to you by your health care provider. Make sure you discuss any questions you have with your health care provider.

## 2014-01-08 NOTE — Assessment & Plan Note (Signed)
Encouraged heart healthy diet, increase exercise, avoid trans fats, consider a krill oil cap daily 

## 2014-01-08 NOTE — Progress Notes (Signed)
Pre visit review using our clinic review tool, if applicable. No additional management support is needed unless otherwise documented below in the visit note. 

## 2014-01-08 NOTE — Assessment & Plan Note (Signed)
Well controlled, no changes to meds. Encouraged heart healthy diet such as the DASH diet and exercise as tolerated.  °

## 2014-01-09 ENCOUNTER — Encounter: Payer: Self-pay | Admitting: Family Medicine

## 2014-01-09 ENCOUNTER — Other Ambulatory Visit: Payer: Self-pay | Admitting: Family Medicine

## 2014-01-09 DIAGNOSIS — L309 Dermatitis, unspecified: Secondary | ICD-10-CM

## 2014-01-09 DIAGNOSIS — R739 Hyperglycemia, unspecified: Secondary | ICD-10-CM | POA: Insufficient documentation

## 2014-01-09 DIAGNOSIS — G4700 Insomnia, unspecified: Secondary | ICD-10-CM

## 2014-01-09 DIAGNOSIS — E1169 Type 2 diabetes mellitus with other specified complication: Secondary | ICD-10-CM | POA: Insufficient documentation

## 2014-01-09 HISTORY — DX: Dermatitis, unspecified: L30.9

## 2014-01-09 HISTORY — DX: Insomnia, unspecified: G47.00

## 2014-01-09 NOTE — Assessment & Plan Note (Signed)
Encouraged good sleep hygiene such as dark, quiet room. No blue/green glowing lights such as computer screens in bedroom. No alcohol or stimulants in evening. Cut down on caffeine as able. Regular exercise is helpful but not just prior to bed time. Can try Melatonin

## 2014-01-09 NOTE — Assessment & Plan Note (Signed)
hgba1c acceptable, minimize simple carbs. Increase exercise as tolerated. Continue current meds 

## 2014-01-09 NOTE — Assessment & Plan Note (Signed)
Given refill on Elidel to use prn

## 2014-01-09 NOTE — Progress Notes (Signed)
Patient ID: Gabriel [name], male   DOB: January 05, 1951, 63 y.o.   MRN: [id number] Gabriel [name] [id number] May 22, 1950 01/09/2014      Progress Note-Follow Up  Subjective  Chief Complaint  Chief Complaint  Patient presents with  . Follow-up    5 month    HPI  Patient is a 63 year old male in today for routine medical care. He is in today for followup. Feels well and is requesting a refill for Elidel to use the sparingly behind his years. Has been struggling with some increasing leg pain, left recently no falls or injury. No recent illness. Denies CP/palp/SOB/HA/congestion/fevers/GI or GU c/o. Taking meds as prescribed  Past Medical History  Diagnosis Date  . Hypertension   . Esophageal reflux 05/21/2012  . Overweight 11/14/2012  . Hyperlipidemia   . Arthritis   . Achalasia   . Ulcer     esophageal ulcer hx  . Insomnia 01/09/2014    Past Surgical History  Procedure Laterality Date  . Heller myotomy  1999    help with swallowing  . Tonsillectomy and adenoidectomy  1963  . Colonoscopy    . Esophagogastroduodenoscopy    . Upper gastrointestinal endoscopy      Family History  Problem Relation Age of Onset  . Alcohol abuse Mother   . Ovarian cancer Mother   . Diabetes Maternal Grandmother   . Alcohol abuse Father   . Lung cancer Father   . Colon cancer Neg Hx   . Esophageal cancer Neg Hx   . Rectal cancer Neg Hx   . Stomach cancer Neg Hx     History   Social History  . Marital Status: Married    Spouse Name: N/A    Number of Children: 2  . Years of Education: N/A   Occupational History  . sales/distribution    Social History Main Topics  . Smoking status: Former Smoker    Types: Cigarettes    Quit date: 04/13/1991  . Smokeless tobacco: Never Used     Comment: 1 ppd for 25 years quit 33'  . Alcohol Use: 4.2 oz/week    7 Glasses of wine per week     Comment: Wine nightly  . Drug Use: No  . Sexual Activity: Not on file   Other Topics Concern  . Not on file    Social History Narrative   Married 2 daughters   Scientific laboratory technician for [employer]          Current Outpatient Prescriptions on File Prior to Visit  Medication Sig Dispense Refill  . mupirocin ointment (BACTROBAN) 2 % Apply to b/l nares qhs x 7 days then as needed  22 g  1  . NON FORMULARY Take by mouth daily. LifeGuard Antioxidant.      . pantoprazole (PROTONIX) 40 MG tablet TAKE 1 TABLET BY MOUTH 2 TIMES DAILY BEFORE A MEAL-30 MINUTES BEFORE BREAKFAST AND SUPPER  60 tablet  6  . Red Yeast Rice Extract 600 MG TABS Take by mouth daily.      . Thiamine HCl (VITAMIN B-1 PO) Take 1 capsule by mouth daily.       No current facility-administered medications on file prior to visit.    No Known Allergies  Review of Systems  Review of Systems  Constitutional: Negative for fever and malaise/fatigue.  HENT: Negative for congestion.   Eyes: Negative for discharge.  Respiratory: Negative for shortness of breath.   Cardiovascular: Negative for chest pain, palpitations  and leg swelling.  Gastrointestinal: Negative for nausea, abdominal pain and diarrhea.  Genitourinary: Negative for dysuria.  Musculoskeletal: Negative for falls.  Skin: Negative for rash.  Neurological: Negative for loss of consciousness and headaches.  Endo/Heme/Allergies: Negative for polydipsia.  Psychiatric/Behavioral: Negative for depression and suicidal ideas. The patient is not nervous/anxious and does not have insomnia.     Objective  BP 127/75  Pulse 82  Temp(Src) 98.4 F (36.9 C) (Oral)  Ht 5' 6.5" (1.689 m)  Wt 186 lb 6.4 oz (84.55 kg)  BMI 29.64 kg/m2  SpO2 100%  Physical Exam  Physical Exam  Constitutional: He is oriented to person, place, and time and well-developed, well-nourished, and in no distress. No distress.  HENT:  Head: Normocephalic and atraumatic.  Eyes: Conjunctivae are normal.  Neck: Neck supple. No thyromegaly present.  Cardiovascular: Normal rate, regular rhythm  and normal heart sounds.   No murmur heard. Pulmonary/Chest: Effort normal and breath sounds normal. No respiratory distress.  Abdominal: He exhibits no distension and no mass. There is no tenderness.  Musculoskeletal: He exhibits no edema.  Neurological: He is alert and oriented to person, place, and time.  Skin: Skin is warm.  Psychiatric: Memory, affect and judgment normal.    Lab Results  Component Value Date   TSH 0.75 01/08/2014   Lab Results  Component Value Date   WBC 5.8 01/08/2014   HGB 15.1 01/08/2014   HCT 44.5 01/08/2014   MCV 93.3 01/08/2014   PLT 271.0 01/08/2014   Lab Results  Component Value Date   CREATININE 1.0 01/08/2014   BUN 13 01/08/2014   NA 138 01/08/2014   K 4.9 01/08/2014   CL 103 01/08/2014   CO2 29 01/08/2014   Lab Results  Component Value Date   ALT 21 01/08/2014   AST 18 01/08/2014   ALKPHOS 47 01/08/2014   BILITOT 0.9 01/08/2014   Lab Results  Component Value Date   CHOL 217* 01/08/2014   Lab Results  Component Value Date   HDL 55.00 01/08/2014   Lab Results  Component Value Date   LDLCALC 153* 01/08/2014   Lab Results  Component Value Date   TRIG 45.0 01/08/2014   Lab Results  Component Value Date   CHOLHDL 4 01/08/2014     Assessment & Plan  HTN (hypertension) Well controlled, no changes to meds. Encouraged heart healthy diet such as the DASH diet and exercise as tolerated.   Hyperlipidemia Encouraged heart healthy diet, increase exercise, avoid trans fats, consider a krill oil cap daily.  Overweight Encouraged DASH diet, decrease po intake and increase exercise as tolerated. Needs 7-8 hours of sleep nightly. Avoid trans fats, eat small, frequent meals every 4-5 hours with lean proteins, complex carbs and healthy fats. Minimize simple carbs, GMO foods.  Insomnia Encouraged good sleep hygiene such as dark, quiet room. No blue/green glowing lights such as computer screens in bedroom. No alcohol or stimulants in evening. Cut down on  caffeine as able. Regular exercise is helpful but not just prior to bed time. Can try Melatonin  Hyperglycemia hgba1c acceptable, minimize simple carbs. Increase exercise as tolerated. Continue current meds  Eczema Given refill on Elidel to use prn

## 2014-01-09 NOTE — Assessment & Plan Note (Signed)
Encouraged DASH diet, decrease po intake and increase exercise as tolerated. Needs 7-8 hours of sleep nightly. Avoid trans fats, eat small, frequent meals every 4-5 hours with lean proteins, complex carbs and healthy fats. Minimize simple carbs, GMO foods. 

## 2014-01-24 ENCOUNTER — Other Ambulatory Visit: Payer: Self-pay

## 2014-01-24 MED ORDER — PANTOPRAZOLE SODIUM 40 MG PO TBEC: DELAYED_RELEASE_TABLET | ORAL | Status: DC

## 2014-07-09 ENCOUNTER — Encounter: Payer: BC Managed Care – PPO | Admitting: Family Medicine

## 2014-07-09 ENCOUNTER — Telehealth: Payer: Self-pay | Admitting: Family Medicine

## 2014-07-09 NOTE — Telephone Encounter (Signed)
Pre visit letter sent  °

## 2014-07-29 ENCOUNTER — Telehealth: Payer: Self-pay | Admitting: *Deleted

## 2014-07-29 NOTE — Telephone Encounter (Signed)
Unable to reach patient at time of Pre-Visit Call.  Left message for patient to return call when available.    

## 2014-07-30 ENCOUNTER — Ambulatory Visit (INDEPENDENT_AMBULATORY_CARE_PROVIDER_SITE_OTHER): Payer: Managed Care, Other (non HMO) | Admitting: Family Medicine

## 2014-07-30 ENCOUNTER — Encounter: Payer: Self-pay | Admitting: Family Medicine

## 2014-07-30 VITALS — BP 122/84 | HR 79 | Temp 98.1°F | Ht 65.0 in | Wt 186.4 lb

## 2014-07-30 DIAGNOSIS — E782 Mixed hyperlipidemia: Secondary | ICD-10-CM | POA: Diagnosis not present

## 2014-07-30 DIAGNOSIS — R739 Hyperglycemia, unspecified: Secondary | ICD-10-CM | POA: Diagnosis not present

## 2014-07-30 DIAGNOSIS — E785 Hyperlipidemia, unspecified: Secondary | ICD-10-CM

## 2014-07-30 DIAGNOSIS — Z0000 Encounter for general adult medical examination without abnormal findings: Secondary | ICD-10-CM

## 2014-07-30 DIAGNOSIS — R351 Nocturia: Secondary | ICD-10-CM | POA: Diagnosis not present

## 2014-07-30 DIAGNOSIS — K219 Gastro-esophageal reflux disease without esophagitis: Secondary | ICD-10-CM

## 2014-07-30 DIAGNOSIS — I10 Essential (primary) hypertension: Secondary | ICD-10-CM | POA: Diagnosis not present

## 2014-07-30 DIAGNOSIS — K222 Esophageal obstruction: Secondary | ICD-10-CM | POA: Diagnosis not present

## 2014-07-30 DIAGNOSIS — Z1211 Encounter for screening for malignant neoplasm of colon: Secondary | ICD-10-CM

## 2014-07-30 DIAGNOSIS — M25552 Pain in left hip: Secondary | ICD-10-CM

## 2014-07-30 DIAGNOSIS — G4700 Insomnia, unspecified: Secondary | ICD-10-CM

## 2014-07-30 LAB — CBC
HEMATOCRIT: 47.2 % (ref 39.0–52.0)
Hemoglobin: 16 g/dL (ref 13.0–17.0)
MCHC: 34 g/dL (ref 30.0–36.0)
MCV: 92.9 fl (ref 78.0–100.0)
Platelets: 267 10*3/uL (ref 150.0–400.0)
RBC: 5.08 Mil/uL (ref 4.22–5.81)
RDW: 13.1 % (ref 11.5–15.5)
WBC: 5.8 10*3/uL (ref 4.0–10.5)

## 2014-07-30 LAB — COMPREHENSIVE METABOLIC PANEL
ALK PHOS: 56 U/L (ref 39–117)
ALT: 21 U/L (ref 0–53)
AST: 15 U/L (ref 0–37)
Albumin: 4.5 g/dL (ref 3.5–5.2)
BILIRUBIN TOTAL: 1.1 mg/dL (ref 0.2–1.2)
BUN: 10 mg/dL (ref 6–23)
CALCIUM: 9.7 mg/dL (ref 8.4–10.5)
CO2: 31 meq/L (ref 19–32)
CREATININE: 0.87 mg/dL (ref 0.40–1.50)
Chloride: 102 mEq/L (ref 96–112)
GFR: 94.32 mL/min (ref >60.00)
GLUCOSE: 112 mg/dL — AB (ref 70–99)
Potassium: 4.5 mEq/L (ref 3.5–5.1)
Sodium: 138 mEq/L (ref 135–145)
TOTAL PROTEIN: 6.9 g/dL (ref 6.0–8.3)

## 2014-07-30 LAB — LIPID PANEL
CHOL/HDL RATIO: 3
Cholesterol: 197 mg/dL (ref 0–200)
HDL: 57.6 mg/dL (ref >39.00)
LDL CALC: 123 mg/dL — AB (ref 0–99)
NONHDL: 139.4
Triglycerides: 82 mg/dL (ref 0.0–149.0)
VLDL: 16.4 mg/dL (ref 0.0–40.0)

## 2014-07-30 LAB — PSA: PSA: 3.39 ng/mL (ref 0.10–4.00)

## 2014-07-30 LAB — HEMOGLOBIN A1C: HEMOGLOBIN A1C: 5.7 % (ref 4.6–6.5)

## 2014-07-30 LAB — TSH: TSH: 0.7 u[IU]/mL (ref 0.35–4.50)

## 2014-07-30 NOTE — Patient Instructions (Signed)
Preventive Care for Adults A healthy lifestyle and preventive care can promote health and wellness. Preventive health guidelines for men include the following key practices:  A routine yearly physical is a good way to check with your health care provider about your health and preventative screening. It is a chance to share any concerns and updates on your health and to receive a thorough exam.  Visit your dentist for a routine exam and preventative care every 6 months. Brush your teeth twice a day and floss once a day. Good oral hygiene prevents tooth decay and gum disease.  The frequency of eye exams is based on your age, health, family medical history, use of contact lenses, and other factors. Follow your health care provider's recommendations for frequency of eye exams.  Eat a healthy diet. Foods such as vegetables, fruits, whole grains, low-fat dairy products, and lean protein foods contain the nutrients you need without too many calories. Decrease your intake of foods high in solid fats, added sugars, and salt. Eat the right amount of calories for you.Get information about a proper diet from your health care provider, if necessary.  Regular physical exercise is one of the most important things you can do for your health. Most adults should get at least 150 minutes of moderate-intensity exercise (any activity that increases your heart rate and causes you to sweat) each week. In addition, most adults need muscle-strengthening exercises on 2 or more days a week.  Maintain a healthy weight. The body mass index (BMI) is a screening tool to identify possible weight problems. It provides an estimate of body fat based on height and weight. Your health care provider can find your BMI and can help you achieve or maintain a healthy weight.For adults 20 years and older:  A BMI below 18.5 is considered underweight.  A BMI of 18.5 to 24.9 is normal.  A BMI of 25 to 29.9 is considered overweight.  A BMI  of 30 and above is considered obese.  Maintain normal blood lipids and cholesterol levels by exercising and minimizing your intake of saturated fat. Eat a balanced diet with plenty of fruit and vegetables. Blood tests for lipids and cholesterol should begin at age 50 and be repeated every 5 years. If your lipid or cholesterol levels are high, you are over 50, or you are at high risk for heart disease, you may need your cholesterol levels checked more frequently.Ongoing high lipid and cholesterol levels should be treated with medicines if diet and exercise are not working.  If you smoke, find out from your health care provider how to quit. If you do not use tobacco, do not start.  Lung cancer screening is recommended for adults aged 73-80 years who are at high risk for developing lung cancer because of a history of smoking. A yearly low-dose CT scan of the lungs is recommended for people who have at least a 30-pack-year history of smoking and are a current smoker or have quit within the past 15 years. A pack year of smoking is smoking an average of 1 pack of cigarettes a day for 1 year (for example: 1 pack a day for 30 years or 2 packs a day for 15 years). Yearly screening should continue until the smoker has stopped smoking for at least 15 years. Yearly screening should be stopped for people who develop a health problem that would prevent them from having lung cancer treatment.  If you choose to drink alcohol, do not have more than  2 drinks per day. One drink is considered to be 12 ounces (355 mL) of beer, 5 ounces (148 mL) of wine, or 1.5 ounces (44 mL) of liquor.  Avoid use of street drugs. Do not share needles with anyone. Ask for help if you need support or instructions about stopping the use of drugs.  High blood pressure causes heart disease and increases the risk of stroke. Your blood pressure should be checked at least every 1-2 years. Ongoing high blood pressure should be treated with  medicines, if weight loss and exercise are not effective.  If you are 45-79 years old, ask your health care provider if you should take aspirin to prevent heart disease.  Diabetes screening involves taking a blood sample to check your fasting blood sugar level. This should be done once every 3 years, after age 45, if you are within normal weight and without risk factors for diabetes. Testing should be considered at a younger age or be carried out more frequently if you are overweight and have at least 1 risk factor for diabetes.  Colorectal cancer can be detected and often prevented. Most routine colorectal cancer screening begins at the age of 50 and continues through age 75. However, your health care provider may recommend screening at an earlier age if you have risk factors for colon cancer. On a yearly basis, your health care provider may provide home test kits to check for hidden blood in the stool. Use of a small camera at the end of a tube to directly examine the colon (sigmoidoscopy or colonoscopy) can detect the earliest forms of colorectal cancer. Talk to your health care provider about this at age 50, when routine screening begins. Direct exam of the colon should be repeated every 5-10 years through age 75, unless early forms of precancerous polyps or small growths are found.  People who are at an increased risk for hepatitis B should be screened for this virus. You are considered at high risk for hepatitis B if:  You were born in a country where hepatitis B occurs often. Talk with your health care provider about which countries are considered high risk.  Your parents were born in a high-risk country and you have not received a shot to protect against hepatitis B (hepatitis B vaccine).  You have HIV or AIDS.  You use needles to inject street drugs.  You live with, or have sex with, someone who has hepatitis B.  You are a man who has sex with other men (MSM).  You get hemodialysis  treatment.  You take certain medicines for conditions such as cancer, organ transplantation, and autoimmune conditions.  Hepatitis C blood testing is recommended for all people born from 1945 through 1965 and any individual with known risks for hepatitis C.  Practice safe sex. Use condoms and avoid high-risk sexual practices to reduce the spread of sexually transmitted infections (STIs). STIs include gonorrhea, chlamydia, syphilis, trichomonas, herpes, HPV, and human immunodeficiency virus (HIV). Herpes, HIV, and HPV are viral illnesses that have no cure. They can result in disability, cancer, and death.  If you are at risk of being infected with HIV, it is recommended that you take a prescription medicine daily to prevent HIV infection. This is called preexposure prophylaxis (PrEP). You are considered at risk if:  You are a man who has sex with other men (MSM) and have other risk factors.  You are a heterosexual man, are sexually active, and are at increased risk for HIV infection.    You take drugs by injection.  You are sexually active with a partner who has HIV.  Talk with your health care provider about whether you are at high risk of being infected with HIV. If you choose to begin PrEP, you should first be tested for HIV. You should then be tested every 3 months for as long as you are taking PrEP.  A one-time screening for abdominal aortic aneurysm (AAA) and surgical repair of large AAAs by ultrasound are recommended for men ages 32 to 67 years who are current or former smokers.  Healthy men should no longer receive prostate-specific antigen (PSA) blood tests as part of routine cancer screening. Talk with your health care provider about prostate cancer screening.  Testicular cancer screening is not recommended for adult males who have no symptoms. Screening includes self-exam, a health care provider exam, and other screening tests. Consult with your health care provider about any symptoms  you have or any concerns you have about testicular cancer.  Use sunscreen. Apply sunscreen liberally and repeatedly throughout the day. You should seek shade when your shadow is shorter than you. Protect yourself by wearing long sleeves, pants, a wide-brimmed hat, and sunglasses year round, whenever you are outdoors.  Once a month, do a whole-body skin exam, using a mirror to look at the skin on your back. Tell your health care provider about new moles, moles that have irregular borders, moles that are larger than a pencil eraser, or moles that have changed in shape or color.  Stay current with required vaccines (immunizations).  Influenza vaccine. All adults should be immunized every year.  Tetanus, diphtheria, and acellular pertussis (Td, Tdap) vaccine. An adult who has not previously received Tdap or who does not know his vaccine status should receive 1 dose of Tdap. This initial dose should be followed by tetanus and diphtheria toxoids (Td) booster doses every 10 years. Adults with an unknown or incomplete history of completing a 3-dose immunization series with Td-containing vaccines should begin or complete a primary immunization series including a Tdap dose. Adults should receive a Td booster every 10 years.  Varicella vaccine. An adult without evidence of immunity to varicella should receive 2 doses or a second dose if he has previously received 1 dose.  Human papillomavirus (HPV) vaccine. Males aged 68-21 years who have not received the vaccine previously should receive the 3-dose series. Males aged 22-26 years may be immunized. Immunization is recommended through the age of 6 years for any male who has sex with males and did not get any or all doses earlier. Immunization is recommended for any person with an immunocompromised condition through the age of 49 years if he did not get any or all doses earlier. During the 3-dose series, the second dose should be obtained 4-8 weeks after the first  dose. The third dose should be obtained 24 weeks after the first dose and 16 weeks after the second dose.  Zoster vaccine. One dose is recommended for adults aged 50 years or older unless certain conditions are present.  Measles, mumps, and rubella (MMR) vaccine. Adults born before [birth year] generally are considered immune to measles and mumps. Adults born in [birth year] or later should have 1 or more doses of MMR vaccine unless there is a contraindication to the vaccine or there is laboratory evidence of immunity to each of the three diseases. A routine second dose of MMR vaccine should be obtained at least 28 days after the first dose for students attending postsecondary  schools, health care workers, or international travelers. People who received inactivated measles vaccine or an unknown type of measles vaccine during 1963-1967 should receive 2 doses of MMR vaccine. People who received inactivated mumps vaccine or an unknown type of mumps vaccine before 1979 and are at high risk for mumps infection should consider immunization with 2 doses of MMR vaccine. Unvaccinated health care workers born before 1957 who lack laboratory evidence of measles, mumps, or rubella immunity or laboratory confirmation of disease should consider measles and mumps immunization with 2 doses of MMR vaccine or rubella immunization with 1 dose of MMR vaccine.  Pneumococcal 13-valent conjugate (PCV13) vaccine. When indicated, a person who is uncertain of his immunization history and has no record of immunization should receive the PCV13 vaccine. An adult aged 19 years or older who has certain medical conditions and has not been previously immunized should receive 1 dose of PCV13 vaccine. This PCV13 should be followed with a dose of pneumococcal polysaccharide (PPSV23) vaccine. The PPSV23 vaccine dose should be obtained at least 8 weeks after the dose of PCV13 vaccine. An adult aged 19 years or older who has certain medical conditions and  previously received 1 or more doses of PPSV23 vaccine should receive 1 dose of PCV13. The PCV13 vaccine dose should be obtained 1 or more years after the last PPSV23 vaccine dose.  Pneumococcal polysaccharide (PPSV23) vaccine. When PCV13 is also indicated, PCV13 should be obtained first. All adults aged 65 years and older should be immunized. An adult younger than age 65 years who has certain medical conditions should be immunized. Any person who resides in a nursing home or long-term care facility should be immunized. An adult smoker should be immunized. People with an immunocompromised condition and certain other conditions should receive both PCV13 and PPSV23 vaccines. People with human immunodeficiency virus (HIV) infection should be immunized as soon as possible after diagnosis. Immunization during chemotherapy or radiation therapy should be avoided. Routine use of PPSV23 vaccine is not recommended for American Indians, [place] Natives, or people younger than 65 years unless there are medical conditions that require PPSV23 vaccine. When indicated, people who have unknown immunization and have no record of immunization should receive PPSV23 vaccine. One-time revaccination 5 years after the first dose of PPSV23 is recommended for people aged 19-64 years who have chronic kidney failure, nephrotic syndrome, asplenia, or immunocompromised conditions. People who received 1-2 doses of PPSV23 before age 65 years should receive another dose of PPSV23 vaccine at age 65 years or later if at least 5 years have passed since the previous dose. Doses of PPSV23 are not needed for people immunized with PPSV23 at or after age 65 years.  Meningococcal vaccine. Adults with asplenia or persistent complement component deficiencies should receive 2 doses of quadrivalent meningococcal conjugate (MenACWY-D) vaccine. The doses should be obtained at least 2 months apart. Microbiologists working with certain meningococcal bacteria,  military recruits, people at risk during an outbreak, and people who travel to or live in countries with a high rate of meningitis should be immunized. A first-year college student up through age 21 years who is living in a residence hall should receive a dose if he did not receive a dose on or after his 16th birthday. Adults who have certain high-risk conditions should receive one or more doses of vaccine.  Hepatitis A vaccine. Adults who wish to be protected from this disease, have certain high-risk conditions, work with hepatitis A-infected animals, work in hepatitis A research labs, or   travel to or work in countries with a high rate of hepatitis A should be immunized. Adults who were previously unvaccinated and who anticipate close contact with an international adoptee during the first 60 days after arrival in the [place] from a country with a high rate of hepatitis A should be immunized.  Hepatitis B vaccine. Adults should be immunized if they wish to be protected from this disease, have certain high-risk conditions, may be exposed to blood or other infectious body fluids, are household contacts or sex partners of hepatitis B positive people, are clients or workers in certain care facilities, or travel to or work in countries with a high rate of hepatitis B.  Haemophilus influenzae type b (Hib) vaccine. A previously unvaccinated person with asplenia or sickle cell disease or having a scheduled splenectomy should receive 1 dose of Hib vaccine. Regardless of previous immunization, a recipient of a hematopoietic stem cell transplant should receive a 3-dose series 6-12 months after his successful transplant. Hib vaccine is not recommended for adults with HIV infection. Preventive Service / Frequency Ages 52 to 17  Blood pressure check.** / Every 1 to 2 years.  Lipid and cholesterol check.** / Every 5 years beginning at age 69.  Hepatitis C blood test.** / For any individual with known risks for  hepatitis C.  Skin self-exam. / Monthly.  Influenza vaccine. / Every year.  Tetanus, diphtheria, and acellular pertussis (Tdap, Td) vaccine.** / Consult your health care provider. 1 dose of Td every 10 years.  Varicella vaccine.** / Consult your health care provider.  HPV vaccine. / 3 doses over 6 months, if 72 or younger.  Measles, mumps, rubella (MMR) vaccine.** / You need at least 1 dose of MMR if you were born in 1957 or later. You may also need a second dose.  Pneumococcal 13-valent conjugate (PCV13) vaccine.** / Consult your health care provider.  Pneumococcal polysaccharide (PPSV23) vaccine.** / 1 to 2 doses if you smoke cigarettes or if you have certain conditions.  Meningococcal vaccine.** / 1 dose if you are age 35 to 60 years and a Market researcher living in a residence hall, or have one of several medical conditions. You may also need additional booster doses.  Hepatitis A vaccine.** / Consult your health care provider.  Hepatitis B vaccine.** / Consult your health care provider.  Haemophilus influenzae type b (Hib) vaccine.** / Consult your health care provider. Ages 35 to 8  Blood pressure check.** / Every 1 to 2 years.  Lipid and cholesterol check.** / Every 5 years beginning at age 57.  Lung cancer screening. / Every year if you are aged 44-80 years and have a 30-pack-year history of smoking and currently smoke or have quit within the past 15 years. Yearly screening is stopped once you have quit smoking for at least 15 years or develop a health problem that would prevent you from having lung cancer treatment.  Fecal occult blood test (FOBT) of stool. / Every year beginning at age 55 and continuing until age 73. You may not have to do this test if you get a colonoscopy every 10 years.  Flexible sigmoidoscopy** or colonoscopy.** / Every 5 years for a flexible sigmoidoscopy or every 10 years for a colonoscopy beginning at age 28 and continuing until age  1.  Hepatitis C blood test.** / For all people born from [birth year] through 1965 and any individual with known risks for hepatitis C.  Skin self-exam. / Monthly.  Influenza vaccine. / Every  year.  Tetanus, diphtheria, and acellular pertussis (Tdap/Td) vaccine.** / Consult your health care provider. 1 dose of Td every 10 years.  Varicella vaccine.** / Consult your health care provider.  Zoster vaccine.** / 1 dose for adults aged 53 years or older.  Measles, mumps, rubella (MMR) vaccine.** / You need at least 1 dose of MMR if you were born in 1957 or later. You may also need a second dose.  Pneumococcal 13-valent conjugate (PCV13) vaccine.** / Consult your health care provider.  Pneumococcal polysaccharide (PPSV23) vaccine.** / 1 to 2 doses if you smoke cigarettes or if you have certain conditions.  Meningococcal vaccine.** / Consult your health care provider.  Hepatitis A vaccine.** / Consult your health care provider.  Hepatitis B vaccine.** / Consult your health care provider.  Haemophilus influenzae type b (Hib) vaccine.** / Consult your health care provider. Ages 77 and over  Blood pressure check.** / Every 1 to 2 years.  Lipid and cholesterol check.**/ Every 5 years beginning at age 85.  Lung cancer screening. / Every year if you are aged 55-80 years and have a 30-pack-year history of smoking and currently smoke or have quit within the past 15 years. Yearly screening is stopped once you have quit smoking for at least 15 years or develop a health problem that would prevent you from having lung cancer treatment.  Fecal occult blood test (FOBT) of stool. / Every year beginning at age 33 and continuing until age 11. You may not have to do this test if you get a colonoscopy every 10 years.  Flexible sigmoidoscopy** or colonoscopy.** / Every 5 years for a flexible sigmoidoscopy or every 10 years for a colonoscopy beginning at age 28 and continuing until age 73.  Hepatitis C blood  test.** / For all people born from [birth year] through 1965 and any individual with known risks for hepatitis C.  Abdominal aortic aneurysm (AAA) screening.** / A one-time screening for ages 50 to 27 years who are current or former smokers.  Skin self-exam. / Monthly.  Influenza vaccine. / Every year.  Tetanus, diphtheria, and acellular pertussis (Tdap/Td) vaccine.** / 1 dose of Td every 10 years.  Varicella vaccine.** / Consult your health care provider.  Zoster vaccine.** / 1 dose for adults aged 34 years or older.  Pneumococcal 13-valent conjugate (PCV13) vaccine.** / Consult your health care provider.  Pneumococcal polysaccharide (PPSV23) vaccine.** / 1 dose for all adults aged 63 years and older.  Meningococcal vaccine.** / Consult your health care provider.  Hepatitis A vaccine.** / Consult your health care provider.  Hepatitis B vaccine.** / Consult your health care provider.  Haemophilus influenzae type b (Hib) vaccine.** / Consult your health care provider. **Family history and personal history of risk and conditions may change your health care provider's recommendations. Document Released: 05/25/2001 Document Revised: 04/03/2013 Document Reviewed: 08/24/2010 [facility] Patient Information 2015 [place], [place]. This information is not intended to replace advice given to you by your health care provider. Make sure you discuss any questions you have with your health care provider.

## 2014-07-30 NOTE — Progress Notes (Signed)
Pre visit review using our clinic review tool, if applicable. No additional management support is needed unless otherwise documented below in the visit note. 

## 2014-08-11 ENCOUNTER — Encounter: Payer: Self-pay | Admitting: Family Medicine

## 2014-08-11 DIAGNOSIS — K635 Polyp of colon: Secondary | ICD-10-CM

## 2014-08-11 DIAGNOSIS — M25552 Pain in left hip: Secondary | ICD-10-CM

## 2014-08-11 DIAGNOSIS — Z1211 Encounter for screening for malignant neoplasm of colon: Secondary | ICD-10-CM

## 2014-08-11 DIAGNOSIS — M25551 Pain in right hip: Secondary | ICD-10-CM | POA: Insufficient documentation

## 2014-08-11 HISTORY — DX: Pain in left hip: M25.552

## 2014-08-11 HISTORY — DX: Polyp of colon: K63.5

## 2014-08-11 HISTORY — DX: Encounter for screening for malignant neoplasm of colon: Z12.11

## 2014-08-11 HISTORY — DX: Pain in right hip: M25.551

## 2014-08-11 NOTE — Assessment & Plan Note (Signed)
Sees chiropractor, Dr Clovis Riley in [place]. Encouraged moist heat and gentle stretching as tolerated. May try NSAIDs and prescription meds as directed and report if symptoms worsen or seek immediate care

## 2014-08-11 NOTE — Assessment & Plan Note (Signed)
Avoid offending foods, start probiotics. Do not eat large meals in late evening and consider raising head of bed.  

## 2014-08-11 NOTE — Assessment & Plan Note (Signed)
Encouraged good sleep hygiene such as dark, quiet room. No blue/green glowing lights such as computer screens in bedroom. No alcohol or stimulants in evening. Cut down on caffeine as able. Regular exercise is helpful but not just prior to bed time.  

## 2014-08-11 NOTE — Assessment & Plan Note (Signed)
Well controlled, no changes to meds. Encouraged heart healthy diet such as the DASH diet and exercise as tolerated.  °

## 2014-08-11 NOTE — Assessment & Plan Note (Signed)
Encouraged heart healthy diet, increase exercise, avoid trans fats, consider a krill oil cap daily. May continue red yeast rice

## 2014-08-11 NOTE — Progress Notes (Signed)
Race Gabriel [name]  [id number] 04-16-1950 08/11/2014      Progress Note-Follow Up  Subjective  Chief Complaint  Chief Complaint  Patient presents with  . Annual Exam    HPI  Patient is a 64 y.o. male in today for routine medical care. Patient is in today for annual exam. Sternal doing well but is acknowledging his biggest trouble lately has been his left hip. He has been seeing a new chiropractor in [place], Dr. Clovis Riley and that has been helpful. His pain in the posterior hip which radiates to his upper leg at times. No incontinence. No acute injury. He does believe changing jobs has been helpful. No recent illness. Denies CP/palp/SOB/HA/congestion/fevers/GI or GU c/o. Taking meds as prescribed  Past Medical History  Diagnosis Date  . Hypertension   . Esophageal reflux 05/21/2012  . Overweight 11/14/2012  . Hyperlipidemia   . Arthritis   . Achalasia   . Ulcer     esophageal ulcer hx  . Insomnia 01/09/2014  . Eczema 01/09/2014    Past Surgical History  Procedure Laterality Date  . Heller myotomy  1999    help with swallowing  . Tonsillectomy and adenoidectomy  1963  . Colonoscopy    . Esophagogastroduodenoscopy    . Upper gastrointestinal endoscopy      Family History  Problem Relation Age of Onset  . Alcohol abuse Mother   . Ovarian cancer Mother   . Diabetes Maternal Grandmother   . Alcohol abuse Father   . Lung cancer Father   . Colon cancer Neg Hx   . Esophageal cancer Neg Hx   . Rectal cancer Neg Hx   . Stomach cancer Neg Hx     History   Social History  . Marital Status: Married    Spouse Name: N/A  . Number of Children: 2  . Years of Education: N/A   Occupational History  . sales/distribution    Social History Main Topics  . Smoking status: Former Smoker    Types: Cigarettes    Quit date: 04/13/1991  . Smokeless tobacco: Never Used     Comment: 1 ppd for 25 years quit 74'  . Alcohol Use: 4.2 oz/week    7 Glasses of wine per week     Comment:  Wine nightly  . Drug Use: No  . Sexual Activity: Not on file   Other Topics Concern  . Not on file   Social History Narrative   Married 2 daughters   Scientific laboratory technician for [employer]          Current Outpatient Prescriptions on File Prior to Visit  Medication Sig Dispense Refill  . enalapril (VASOTEC) 10 MG tablet Take 1 tablet (10 mg total) by mouth daily. 90 tablet 2  . etodolac (LODINE) 500 MG tablet TAKE 1 TABLET BY MOUTH TWICE A DAY 60 tablet 5  . NON FORMULARY Take by mouth daily. LifeGuard Antioxidant.    . pantoprazole (PROTONIX) 40 MG tablet TAKE 1 TABLET BY MOUTH 2 TIMES DAILY BEFORE A MEAL-30 MINUTES BEFORE BREAKFAST AND SUPPER 60 tablet 3  . Red Yeast Rice Extract 600 MG TABS Take by mouth daily.    . Thiamine HCl (VITAMIN B-1 PO) Take 1 capsule by mouth daily.    . mupirocin ointment (BACTROBAN) 2 % Apply to b/l nares qhs x 7 days then as needed (Patient not taking: Reported on 07/30/2014) 22 g 1  . pimecrolimus (ELIDEL) 1 % cream Apply 1 application topically  daily. (Patient not taking: Reported on 07/30/2014) 60 g 5   No current facility-administered medications on file prior to visit.    No Known Allergies  Review of Systems  Review of Systems  Constitutional: Positive for malaise/fatigue. Negative for fever and chills.  HENT: Negative for congestion, hearing loss and nosebleeds.   Eyes: Negative for discharge.  Respiratory: Negative for cough, sputum production, shortness of breath and wheezing.   Cardiovascular: Negative for chest pain, palpitations and leg swelling.  Gastrointestinal: Positive for heartburn. Negative for nausea, vomiting, abdominal pain, diarrhea, constipation and blood in stool.  Genitourinary: Negative for dysuria, urgency, frequency and hematuria.  Musculoskeletal: Positive for joint pain. Negative for myalgias, back pain and falls.       Left hip pain has been seeing chiropractor, Dr Clovis Riley in [place]  Skin: Negative for  rash.  Neurological: Negative for dizziness, tremors, sensory change, focal weakness, loss of consciousness, weakness and headaches.  Endo/Heme/Allergies: Negative for polydipsia. Does not bruise/bleed easily.  Psychiatric/Behavioral: Negative for depression and suicidal ideas. The patient is not nervous/anxious and does not have insomnia.     Objective  BP 122/84 mmHg  Pulse 79  Temp(Src) 98.1 F (36.7 C) (Oral)  Ht 5\' 5"  (1.651 m)  Wt 186 lb 6 oz (84.539 kg)  BMI 31.01 kg/m2  SpO2 98%  Physical Exam  Physical Exam  Constitutional: He is oriented to person, place, and time and well-developed, well-nourished, and in no distress. No distress.  HENT:  Head: Normocephalic and atraumatic.  Eyes: Conjunctivae are normal.  Neck: Neck supple. No thyromegaly present.  Cardiovascular: Normal rate, regular rhythm and normal heart sounds.   No murmur heard. Pulmonary/Chest: Effort normal and breath sounds normal. No respiratory distress.  Abdominal: He exhibits no distension and no mass. There is no tenderness.  Musculoskeletal: He exhibits no edema.  Neurological: He is alert and oriented to person, place, and time.  Skin: Skin is warm.  Psychiatric: Memory, affect and judgment normal.    Lab Results  Component Value Date   TSH 0.70 07/30/2014   Lab Results  Component Value Date   WBC 5.8 07/30/2014   HGB 16.0 07/30/2014   HCT 47.2 07/30/2014   MCV 92.9 07/30/2014   PLT 267.0 07/30/2014   Lab Results  Component Value Date   CREATININE 0.87 07/30/2014   BUN 10 07/30/2014   NA 138 07/30/2014   K 4.5 07/30/2014   CL 102 07/30/2014   CO2 31 07/30/2014   Lab Results  Component Value Date   ALT 21 07/30/2014   AST 15 07/30/2014   ALKPHOS 56 07/30/2014   BILITOT 1.1 07/30/2014   Lab Results  Component Value Date   CHOL 197 07/30/2014   Lab Results  Component Value Date   HDL 57.60 07/30/2014   Lab Results  Component Value Date   LDLCALC 123* 07/30/2014   Lab  Results  Component Value Date   TRIG 82.0 07/30/2014   Lab Results  Component Value Date   CHOLHDL 3 07/30/2014     Assessment & Plan  HTN (hypertension) Well controlled, no changes to meds. Encouraged heart healthy diet such as the DASH diet and exercise as tolerated.    Annual physical exam Patient encouraged to maintain heart healthy diet, regular exercise, adequate sleep. Consider daily probiotics. Take medications as prescribed. Labs ordered and reviewed with visit   Hyperglycemia hgba1c acceptable, minimize simple carbs. Increase exercise as tolerated. 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  Has historically seen Dr Delion/GI and Dr Farrell/surgeon at [facility]   Hyperlipidemia Encouraged heart healthy diet, increase exercise, avoid trans fats, consider a krill oil cap daily. May continue red yeast rice   Insomnia Encouraged good sleep hygiene such as dark, quiet room. No blue/green glowing lights such as computer screens in bedroom. No alcohol or stimulants in evening. Cut down on caffeine as able. Regular exercise is helpful but not just prior to bed time.    Esophageal reflux Avoid offending foods, start probiotics. Do not eat large meals in late evening and consider raising head of bed.    Left hip pain Sees chiropractor, Dr Clovis Riley in [place]. Encouraged moist heat and gentle stretching as tolerated. May try NSAIDs and prescription meds as directed and report if symptoms worsen or seek immediate care   Colon cancer screening Referred back to Dr Carlean Purl

## 2014-08-11 NOTE — Assessment & Plan Note (Addendum)
Post Heller myotomy - bxs benign Dilated to 18 mm 01/30/2013 - PPI bid started Has next endoscopy on Aug 30, 2013 with more dilatation.  Has historically seen Dr Delion/GI and Dr Farrell/surgeon at [facility] : [facility]

## 2014-08-11 NOTE — Assessment & Plan Note (Signed)
hgba1c acceptable, minimize simple carbs. Increase exercise as tolerated. Continue current meds 

## 2014-08-11 NOTE — Assessment & Plan Note (Signed)
Patient encouraged to maintain heart healthy diet, regular exercise, adequate sleep. Consider daily probiotics. Take medications as prescribed. Labs ordered and reviewed with visit

## 2014-08-11 NOTE — Assessment & Plan Note (Signed)
Referred back to Dr Carlean Purl

## 2014-08-17 ENCOUNTER — Other Ambulatory Visit: Payer: Self-pay | Admitting: Internal Medicine

## 2014-08-24 ENCOUNTER — Other Ambulatory Visit: Payer: Self-pay | Admitting: Internal Medicine

## 2014-10-27 ENCOUNTER — Other Ambulatory Visit: Payer: Self-pay | Admitting: Family Medicine

## 2014-12-03 ENCOUNTER — Other Ambulatory Visit: Payer: Self-pay | Admitting: Family Medicine

## 2014-12-30 ENCOUNTER — Other Ambulatory Visit: Payer: Self-pay | Admitting: Family Medicine

## 2015-02-03 ENCOUNTER — Other Ambulatory Visit: Payer: Self-pay | Admitting: Family Medicine

## 2015-02-19 ENCOUNTER — Telehealth: Payer: Self-pay | Admitting: Family Medicine

## 2015-02-19 NOTE — Telephone Encounter (Signed)
Appointment scheduled with E. Saguier for 02/20/15 at 9:15 am.

## 2015-02-19 NOTE — Telephone Encounter (Signed)
Patient Name: Gabriel [name]  DOB: Apr 26, 1950    Initial Comment caller states his BP is 154/98   Nurse Assessment  Nurse: Thad Ranger, RN, Langley Gauss Date/Time (Eastern Time): 02/19/2015 2:38:02 PM  Confirm and document reason for call. If symptomatic, describe symptoms. 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 Takes Enalapril 10mg  po qd. 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  Has the patient traveled out of the country within the last 30 days? ---Not Applicable  Does the patient have any new or worsening symptoms? ---Yes  Will a triage be completed? ---Yes  Related visit to physician within the last 2 weeks? ---Yes  Does the PT have any chronic conditions? (i.e. diabetes, asthma, etc.) ---Yes  List chronic conditions. ---HTN     Guidelines    Guideline Title Affirmed Question Affirmed Notes  High Blood Pressure BP ? 160/100    Final Disposition User   See PCP When Office is Open (within 3 days) Carmon, RN, Langley Gauss    Comments  Pt reports he already has an appt made for 02/20/15 at 60 with Dr Mackie Pai.   Referrals  REFERRED TO PCP OFFICE   Disagree/Comply: Comply

## 2015-02-20 ENCOUNTER — Encounter: Payer: Self-pay | Admitting: Medical

## 2015-02-20 ENCOUNTER — Ambulatory Visit (INDEPENDENT_AMBULATORY_CARE_PROVIDER_SITE_OTHER): Payer: Managed Care, Other (non HMO) | Admitting: Medical

## 2015-02-20 VITALS — BP 140/90 | HR 88 | Temp 97.8°F | Ht 66.5 in | Wt 189.0 lb

## 2015-02-20 DIAGNOSIS — I10 Essential (primary) hypertension: Secondary | ICD-10-CM

## 2015-02-20 LAB — COMPREHENSIVE METABOLIC PANEL
ALBUMIN: 4.3 g/dL (ref 3.5–5.2)
ALT: 23 U/L (ref 0–53)
AST: 16 U/L (ref 0–37)
Alkaline Phosphatase: 51 U/L (ref 39–117)
BUN: 13 mg/dL (ref 6–23)
CALCIUM: 9.8 mg/dL (ref 8.4–10.5)
CHLORIDE: 102 meq/L (ref 96–112)
CO2: 28 meq/L (ref 19–32)
Creatinine, Ser: 0.85 mg/dL (ref 0.40–1.50)
GFR: 96.71 mL/min (ref >60.00)
GLUCOSE: 101 mg/dL — AB (ref 70–99)
POTASSIUM: 4.2 meq/L (ref 3.5–5.1)
SODIUM: 137 meq/L (ref 135–145)
Total Bilirubin: 0.7 mg/dL (ref 0.2–1.2)
Total Protein: 6.9 g/dL (ref 6.0–8.3)

## 2015-02-20 MED ORDER — ENALAPRIL MALEATE 10 MG PO TABS: 10.0000 mg | ORAL_TABLET | Freq: Every day | ORAL | Status: DC

## 2015-02-20 MED ORDER — HYDROCHLOROTHIAZIDE 12.5 MG PO TABS: 12.5000 mg | ORAL_TABLET | Freq: Every day | ORAL | Status: DC

## 2015-02-20 NOTE — Progress Notes (Signed)
Pre visit review using our clinic review tool, if applicable. No additional management support is needed unless otherwise documented below in the visit note. 

## 2015-02-20 NOTE — Patient Instructions (Addendum)
Your bp has been moderate high(above 140/90). Continue your enalapril and will add hctz.  Check bp daily. Consider getting new machine.  ON future DOT bp checks avoid any caffeine day of bp check.  Please get cmp today.  While on hctz good idea to eat banana every other day. Or other foods high in potassium.  Follow up in 2 wks or as needed.

## 2015-02-20 NOTE — Progress Notes (Signed)
Subjective:    Patient ID: Gabriel [name], male    DOB: 11-03-1950, 64 y.o.   MRN: [id number]  HPI  Pt in states yesterday when he did DOT physical his bp was 154/98. Pt also had 4 reading yesterday that were 154/87, 140/83, 160/83, 167/90 and 146/97. Pt did not qualify for full dot license. He was given 3 month to get his bp med under control.  Pt drinks 2 cups coffee. He is not a smoker.  Pt had no cardiac or neurologic signs or symptoms.    Review of Systems  Constitutional: Negative for fever, chills, diaphoresis, activity change and fatigue.  Respiratory: Negative for cough, chest tightness and shortness of breath.   Cardiovascular: Negative for chest pain, palpitations and leg swelling.  Gastrointestinal: Negative for nausea, vomiting and abdominal pain.  Musculoskeletal: Negative for neck pain and neck stiffness.  Neurological: Negative for dizziness, tremors, seizures, syncope, facial asymmetry, speech difficulty, weakness, light-headedness, numbness and headaches.  Psychiatric/Behavioral: Negative for behavioral problems, confusion and agitation. The patient is not nervous/anxious.     Past Medical History  Diagnosis Date  . Hypertension   . Esophageal reflux 05/21/2012  . Overweight 11/14/2012  . Hyperlipidemia   . Arthritis   . Achalasia   . Ulcer     esophageal ulcer hx  . Insomnia 01/09/2014  . Eczema 01/09/2014  . Left hip pain 08/11/2014  . Colon cancer screening 08/11/2014    Sees [facility], Dr Carlean Purl     Social History   Social History  . Marital Status: Married    Spouse Name: N/A  . Number of Children: 2  . Years of Education: N/A   Occupational History  . sales/distribution    Social History Main Topics  . Smoking status: Former Smoker    Types: Cigarettes    Quit date: 04/13/1991  . Smokeless tobacco: Never Used     Comment: 1 ppd for 25 years quit 69'  . Alcohol Use: 4.2 oz/week    7 Glasses of wine per week     Comment: Wine nightly  . Drug Use:  No  . Sexual Activity: Not on file   Other Topics Concern  . 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 Heller myotomy  1999    help with swallowing  . Tonsillectomy and adenoidectomy  1963  . Colonoscopy    . Esophagogastroduodenoscopy    . Upper gastrointestinal endoscopy      Family History  Problem Relation Age of Onset  . Alcohol abuse Mother   . Ovarian cancer Mother   . Diabetes Maternal Grandmother   . Alcohol abuse Father   . Lung cancer Father   . Colon cancer Neg Hx   . Esophageal cancer Neg Hx   . Rectal cancer Neg Hx   . Stomach cancer Neg Hx     No Known Allergies  Current Outpatient Prescriptions on File Prior to Visit  Medication Sig Dispense Refill  . enalapril (VASOTEC) 10 MG tablet TAKE 1 TABLET (10 MG TOTAL) BY MOUTH DAILY. 90 tablet 2  . etodolac (LODINE) 500 MG tablet TAKE 1 TABLET BY MOUTH TWICE A DAY 60 tablet 5  . etodolac (LODINE) 500 MG tablet TAKE 1 TABLET BY MOUTH TWICE A DAY 60 tablet 2  . mupirocin ointment (BACTROBAN)  2 % Apply to b/l nares qhs x 7 days then as needed 22 g 1  . NON FORMULARY Take by mouth daily. LifeGuard Antioxidant.    . pantoprazole (PROTONIX) 40 MG tablet TAKE 1 TABLET BY MOUTH 2 TIMES DAILY BEFORE A MEAL-30 MINUTES BEFORE BREAKFAST AND SUPPER 60 tablet 3  . pantoprazole (PROTONIX) 40 MG tablet TAKE 1 TABLET BY MOUTH 2 TIMES DAILY BEFORE A MEAL-30 MINUTES BEFORE BREAKFAST AND SUPPER 60 tablet 3  . pimecrolimus (ELIDEL) 1 % cream Apply 1 application topically daily. 60 g 5  . Red Yeast Rice Extract 600 MG TABS Take by mouth daily.    . Thiamine HCl (VITAMIN B-1 PO) Take 1 capsule by mouth daily.     No current facility-administered medications on file prior to visit.    BP 140/90 mmHg  Pulse 88  Temp(Src) 97.8 F (36.6 C) (Oral)  Ht 5' 6.5" (1.689 m)  Wt 189 lb (85.73 kg)  BMI  30.05 kg/m2  SpO2 98%       Objective:   Physical Exam  General Mental Status- Alert. General Appearance- Not in acute distress.   Skin General: Color- Normal Color. Moisture- Normal Moisture.  Neck Carotid Arteries- Normal color. Moisture- Normal Moisture. No carotid bruits. No JVD.  Chest and Lung Exam Auscultation: Breath Sounds:-Normal.  Cardiovascular Auscultation:Rythm- Regular. Murmurs & Other Heart Sounds:Auscultation of the heart reveals- No Murmurs.   Neurologic Cranial Nerve exam:- CN III-XII intact(No nystagmus), symmetric smile. Strength:- 5/5 equal and symmetric strength both upper and lower extremities.      Assessment & Plan:  Your bp has been moderate high(Above 140/90). Continue your enalapril and will add hctz.  Check bp daily. Consider getting new machine.  ON future DOT bp checks avoid any caffeine day of bp check.  Please get cmp today.  While on hctz good idea to eat banana every other day. Or other foods high in potassium.  Follow up in 2 wks or as needed.

## 2015-03-13 ENCOUNTER — Ambulatory Visit (INDEPENDENT_AMBULATORY_CARE_PROVIDER_SITE_OTHER): Payer: Managed Care, Other (non HMO) | Admitting: Family Medicine

## 2015-03-13 VITALS — BP 132/83 | HR 97

## 2015-03-13 DIAGNOSIS — I10 Essential (primary) hypertension: Secondary | ICD-10-CM

## 2015-03-13 DIAGNOSIS — Z0130 Encounter for examination of blood pressure without abnormal findings: Secondary | ICD-10-CM

## 2015-03-13 NOTE — Progress Notes (Signed)
Pre visit review using our clinic review tool, if applicable. No additional management support is needed unless otherwise documented below in the visit note.  Patient presented today for a blood pressure check per office visit note 02/20/15. Readings were as follow: B/P 142/85 P 96 & B/P 132/83 P 97.  Per Dr. Charlett Blake: Continue current medication regimen until next follow-up appointment. Patient voiced understanding and did not have any questions or concerns prior to leaving the office. Next scheduled appointment is 03/31/15.

## 2015-03-13 NOTE — Progress Notes (Signed)
Note reviewed agree with plan

## 2015-03-16 NOTE — Progress Notes (Signed)
RN blood check note reviewed. Agree with documention and plan. 

## 2015-03-19 ENCOUNTER — Other Ambulatory Visit: Payer: Self-pay | Admitting: Medical

## 2015-03-21 ENCOUNTER — Telehealth: Payer: Self-pay | Admitting: Family Medicine

## 2015-03-21 NOTE — Telephone Encounter (Signed)
Left message for patient to call back about Flu Shot

## 2015-03-31 ENCOUNTER — Ambulatory Visit (INDEPENDENT_AMBULATORY_CARE_PROVIDER_SITE_OTHER): Payer: Managed Care, Other (non HMO) | Admitting: Family Medicine

## 2015-03-31 ENCOUNTER — Encounter: Payer: Self-pay | Admitting: Family Medicine

## 2015-03-31 VITALS — BP 128/82 | HR 94 | Temp 98.0°F | Ht 66.5 in | Wt 187.0 lb

## 2015-03-31 DIAGNOSIS — E781 Pure hyperglyceridemia: Secondary | ICD-10-CM

## 2015-03-31 DIAGNOSIS — H60399 Other infective otitis externa, unspecified ear: Secondary | ICD-10-CM | POA: Insufficient documentation

## 2015-03-31 DIAGNOSIS — H60393 Other infective otitis externa, bilateral: Secondary | ICD-10-CM

## 2015-03-31 DIAGNOSIS — Z23 Encounter for immunization: Secondary | ICD-10-CM | POA: Diagnosis not present

## 2015-03-31 DIAGNOSIS — K219 Gastro-esophageal reflux disease without esophagitis: Secondary | ICD-10-CM | POA: Diagnosis not present

## 2015-03-31 DIAGNOSIS — R739 Hyperglycemia, unspecified: Secondary | ICD-10-CM

## 2015-03-31 DIAGNOSIS — K222 Esophageal obstruction: Secondary | ICD-10-CM

## 2015-03-31 DIAGNOSIS — I10 Essential (primary) hypertension: Secondary | ICD-10-CM

## 2015-03-31 DIAGNOSIS — M545 Low back pain: Secondary | ICD-10-CM

## 2015-03-31 HISTORY — DX: Other infective otitis externa, unspecified ear: H60.399

## 2015-03-31 MED ORDER — HYOSCYAMINE SULFATE 0.125 MG SL SUBL: 0.1250 mg | SUBLINGUAL_TABLET | SUBLINGUAL | Status: DC | PRN

## 2015-03-31 NOTE — Assessment & Plan Note (Signed)
Follows with [facility] still struggling with mild dysphagia. Try hyoscyamine and will call [facility] for further treatment.

## 2015-03-31 NOTE — Assessment & Plan Note (Signed)
Well controlled, no changes to meds. Encouraged heart healthy diet such as the DASH diet and exercise as tolerated.  °

## 2015-03-31 NOTE — Assessment & Plan Note (Signed)
Hydrogen peroxide qhs as needed. Some wax also noted call for disimpaction if wax worsens.

## 2015-03-31 NOTE — Assessment & Plan Note (Signed)
Avoid offending foods, start probiotics. Do not eat large meals in late evening and consider raising head of bed.  

## 2015-03-31 NOTE — Patient Instructions (Addendum)
Hydrogen peroxide 3-5 drops after shower daily as needed  Hypertension Hypertension, commonly called high blood pressure, is when the force of blood pumping through your arteries is too strong. Your arteries are the blood vessels that carry blood from your heart throughout your body. A blood pressure reading consists of a higher number over a lower number, such as 110/72. The higher number (systolic) is the pressure inside your arteries when your heart pumps. The lower number (diastolic) is the pressure inside your arteries when your heart relaxes. Ideally you want your blood pressure below 120/80. Hypertension forces your heart to work harder to pump blood. Your arteries may become narrow or stiff. Having untreated or uncontrolled hypertension can cause heart attack, stroke, kidney disease, and other problems. RISK FACTORS Some risk factors for high blood pressure are controllable. Others are not.  Risk factors you cannot control include:   Race. You may be at higher risk if you are African American.  Age. Risk increases with age.  Gender. Men are at higher risk than women before age 40 years. After age 62, women are at higher risk than men. Risk factors you can control include:  Not getting enough exercise or physical activity.  Being overweight.  Getting too much fat, sugar, calories, or salt in your diet.  Drinking too much alcohol. SIGNS AND SYMPTOMS Hypertension does not usually cause signs or symptoms. Extremely high blood pressure (hypertensive crisis) may cause headache, anxiety, shortness of breath, and nosebleed. DIAGNOSIS To check if you have hypertension, your health care provider will measure your blood pressure while you are seated, with your arm held at the level of your heart. It should be measured at least twice using the same arm. Certain conditions can cause a difference in blood pressure between your right and left arms. A blood pressure reading that is higher than  normal on one occasion does not mean that you need treatment. If it is not clear whether you have high blood pressure, you may be asked to return on a different day to have your blood pressure checked again. Or, you may be asked to monitor your blood pressure at home for 1 or more weeks. TREATMENT Treating high blood pressure includes making lifestyle changes and possibly taking medicine. Living a healthy lifestyle can help lower high blood pressure. You may need to change some of your habits. Lifestyle changes may include:  Following the DASH diet. This diet is high in fruits, vegetables, and whole grains. It is low in salt, red meat, and added sugars.  Keep your sodium intake below 2,300 mg per day.  Getting at least 30-45 minutes of aerobic exercise at least 4 times per week.  Losing weight if necessary.  Not smoking.  Limiting alcoholic beverages.  Learning ways to reduce stress. Your health care provider may prescribe medicine if lifestyle changes are not enough to get your blood pressure under control, and if one of the following is true:  You are 54-94 years of age and your systolic blood pressure is above 140.  You are 42 years of age or older, and your systolic blood pressure is above 150.  Your diastolic blood pressure is above 90.  You have diabetes, and your systolic blood pressure is over [id number] or your diastolic blood pressure is over 90.  You have kidney disease and your blood pressure is above 140/90.  You have heart disease and your blood pressure is above 140/90. Your personal target blood pressure may vary depending on your  medical conditions, your age, and other factors. HOME CARE INSTRUCTIONS  Have your blood pressure rechecked as directed by your health care provider.   Take medicines only as directed by your health care provider. Follow the directions carefully. Blood pressure medicines must be taken as prescribed. The medicine does not work as well when you  skip doses. Skipping doses also puts you at risk for problems.  Do not smoke.   Monitor your blood pressure at home as directed by your health care provider. SEEK MEDICAL CARE IF:   You think you are having a reaction to medicines taken.  You have recurrent headaches or feel dizzy.  You have swelling in your ankles.  You have trouble with your vision. SEEK IMMEDIATE MEDICAL CARE IF:  You develop a severe headache or confusion.  You have unusual weakness, numbness, or feel faint.  You have severe chest or abdominal pain.  You vomit repeatedly.  You have trouble breathing. MAKE SURE YOU:   Understand these instructions.  Will watch your condition.  Will get help right away if you are not doing well or get worse.   This information is not intended to replace advice given to you by your health care provider. Make sure you discuss any questions you have with your health care provider.   Document Released: 03/29/2005 Document Revised: 08/13/2014 Document Reviewed: 01/19/2013 Elsevier Interactive Patient Education [employer].

## 2015-03-31 NOTE — Progress Notes (Signed)
Subjective:    Patient ID: Gabriel [name], male    DOB: 11/11/1950, 64 y.o.   MRN: [id number]  Chief Complaint  Patient presents with  . Follow-up    HPI Patient is in today for follow-up. He is generally doing well although he does note difficulty with itching in his ears bilaterally off and on. No pain or discharge. No other recent illness. Denies head congestion. No acute complaints otherwise. Denies CP/palp/SOB/HA/congestion/fevers/GI or GU c/o. Taking meds as prescribed  Past Medical History  Diagnosis Date  . Hypertension   . Esophageal reflux 05/21/2012  . Overweight 11/14/2012  . Hyperlipidemia   . Arthritis   . Achalasia   . Ulcer     esophageal ulcer hx  . Insomnia 01/09/2014  . Eczema 01/09/2014  . Left hip pain 08/11/2014  . Colon cancer screening 08/11/2014    Sees [facility], Dr Carlean Purl   . Otitis, externa, infective 03/31/2015    Past Surgical History  Procedure Laterality Date  . Heller myotomy  1999    help with swallowing  . Tonsillectomy and adenoidectomy  1963  . Colonoscopy    . Esophagogastroduodenoscopy    . Upper gastrointestinal endoscopy      Family History  Problem Relation Age of Onset  . Alcohol abuse Mother   . Ovarian cancer Mother   . Diabetes Maternal Grandmother   . Alcohol abuse Father   . Lung cancer Father   . Colon cancer Neg Hx   . Esophageal cancer Neg Hx   . Rectal cancer Neg Hx   . Stomach cancer Neg Hx     Social History   Social History  . Marital Status: Married    Spouse Name: N/A  . Number of Children: 2  . Years of Education: N/A   Occupational History  . sales/distribution    Social History Main Topics  . Smoking status: Former Smoker    Types: Cigarettes    Quit date: 04/13/1991  . Smokeless tobacco: Never Used     Comment: 1 ppd for 25 years quit 68'  . Alcohol Use: 4.2 oz/week    7 Glasses of wine per week     Comment: Wine nightly  . Drug Use: No  . Sexual Activity: Not on file   Other Topics Concern  .  Not on file   Social History Narrative   Married 2 daughters   Scientific laboratory technician for [employer]          Outpatient Prescriptions Prior to Visit  Medication Sig Dispense Refill  . enalapril (VASOTEC) 10 MG tablet Take 1 tablet (10 mg total) by mouth daily. 90 tablet 1  . etodolac (LODINE) 500 MG tablet TAKE 1 TABLET BY MOUTH TWICE A DAY 60 tablet 5  . hydrochlorothiazide (HYDRODIURIL) 12.5 MG tablet TAKE 1 TABLET (12.5 MG TOTAL) BY MOUTH DAILY. 30 tablet 0  . mupirocin ointment (BACTROBAN) 2 % Apply to b/l nares qhs x 7 days then as needed 22 g 1  . NON FORMULARY Take by mouth daily. LifeGuard Antioxidant.    . pantoprazole (PROTONIX) 40 MG tablet TAKE 1 TABLET BY MOUTH 2 TIMES DAILY BEFORE A MEAL-30 MINUTES BEFORE BREAKFAST AND SUPPER 60 tablet 3  . pimecrolimus (ELIDEL) 1 % cream Apply 1 application topically daily. 60 g 5  . Red Yeast Rice Extract 600 MG TABS Take by mouth daily.    . Thiamine HCl (VITAMIN B-1 PO) Take 1 capsule by mouth daily.  No facility-administered medications prior to visit.    No Known Allergies  Review of Systems  Constitutional: Negative for fever and malaise/fatigue.  HENT: Negative for congestion.   Eyes: Negative for discharge.  Respiratory: Negative for shortness of breath.   Cardiovascular: Negative for chest pain, palpitations and leg swelling.  Gastrointestinal: Negative for nausea and abdominal pain.  Genitourinary: Negative for dysuria.  Musculoskeletal: Negative for falls.  Skin: Negative for rash.  Neurological: Negative for loss of consciousness and headaches.  Endo/Heme/Allergies: Negative for environmental allergies.  Psychiatric/Behavioral: Negative for depression. The patient is not nervous/anxious.        Objective:    Physical Exam  Constitutional: He is oriented to person, place, and time. He appears well-developed and well-nourished. No distress.  HENT:  Head: Normocephalic and atraumatic.  Nose: Nose  normal.  Cerumen b/l ears noted, not occluding.   Eyes: Right eye exhibits no discharge. Left eye exhibits no discharge.  Neck: Normal range of motion. Neck supple.  Cardiovascular: Normal rate and regular rhythm.   No murmur heard. Pulmonary/Chest: Effort normal and breath sounds normal.  Abdominal: Soft. Bowel sounds are normal. There is no tenderness.  Musculoskeletal: He exhibits no edema.  Neurological: He is alert and oriented to person, place, and time.  Skin: Skin is warm and dry.  Psychiatric: He has a normal mood and affect.  Nursing note and vitals reviewed.   BP 128/82 mmHg  Pulse 94  Temp(Src) 98 F (36.7 C) (Oral)  Ht 5' 6.5" (1.689 m)  Wt 187 lb (84.823 kg)  BMI 29.73 kg/m2  SpO2 97% Wt Readings from Last 3 Encounters:  03/31/15 187 lb (84.823 kg)  02/20/15 189 lb (85.73 kg)  07/30/14 186 lb 6 oz (84.539 kg)     Lab Results  Component Value Date   WBC 5.8 07/30/2014   HGB 16.0 07/30/2014   HCT 47.2 07/30/2014   PLT 267.0 07/30/2014   GLUCOSE 101* 02/20/2015   CHOL 197 07/30/2014   TRIG 82.0 07/30/2014   HDL 57.60 07/30/2014   LDLCALC 123* 07/30/2014   ALT 23 02/20/2015   AST 16 02/20/2015   NA 137 02/20/2015   K 4.2 02/20/2015   CL 102 02/20/2015   CREATININE 0.85 02/20/2015   BUN 13 02/20/2015   CO2 28 02/20/2015   TSH 0.70 07/30/2014   PSA 3.39 07/30/2014   HGBA1C 5.7 07/30/2014    Lab Results  Component Value Date   TSH 0.70 07/30/2014   Lab Results  Component Value Date   WBC 5.8 07/30/2014   HGB 16.0 07/30/2014   HCT 47.2 07/30/2014   MCV 92.9 07/30/2014   PLT 267.0 07/30/2014   Lab Results  Component Value Date   NA 137 02/20/2015   K 4.2 02/20/2015   CO2 28 02/20/2015   GLUCOSE 101* 02/20/2015   BUN 13 02/20/2015   CREATININE 0.85 02/20/2015   BILITOT 0.7 02/20/2015   ALKPHOS 51 02/20/2015   AST 16 02/20/2015   ALT 23 02/20/2015   PROT 6.9 02/20/2015   ALBUMIN 4.3 02/20/2015   CALCIUM 9.8 02/20/2015   GFR 96.71  02/20/2015   Lab Results  Component Value Date   CHOL 197 07/30/2014   Lab Results  Component Value Date   HDL 57.60 07/30/2014   Lab Results  Component Value Date   LDLCALC 123* 07/30/2014   Lab Results  Component Value Date   TRIG 82.0 07/30/2014   Lab Results  Component Value Date   CHOLHDL 3  07/30/2014   Lab Results  Component Value Date   HGBA1C 5.7 07/30/2014       Assessment & Plan:   Problem List Items Addressed This Visit    Esophageal reflux    Avoid offending foods, start probiotics. Do not eat large meals in late evening and consider raising head of bed.       Relevant Medications   hyoscyamine (LEVSIN SL) 0.125 MG SL tablet   Other Relevant Orders   TSH   CBC   Comprehensive metabolic panel   Lipid panel   Esophageal stricture    Follows with [facility] still struggling with mild dysphagia. Try hyoscyamine and will call [facility] for further treatment.      HTN (hypertension)    Well controlled, no changes to meds. Encouraged heart healthy diet such as the DASH diet and exercise as tolerated.       Relevant Medications   hyoscyamine (LEVSIN SL) 0.125 MG SL tablet   Other Relevant Orders   TSH   CBC   Comprehensive metabolic panel   Lipid panel   Hyperglycemia    hgba1c acceptable, minimize simple carbs. Increase exercise as tolerated.       Relevant Medications   hyoscyamine (LEVSIN SL) 0.125 MG SL tablet   Other Relevant Orders   TSH   CBC   Comprehensive metabolic panel   Lipid panel   Otitis, externa, infective    Hydrogen peroxide qhs as needed. Some wax also noted call for disimpaction if wax worsens.       Other Visit Diagnoses    Encounter for immunization    -  Primary    Hyperlipidemia, group B        Relevant Medications    hyoscyamine (LEVSIN SL) 0.125 MG SL tablet    Other Relevant Orders    TSH    CBC    Comprehensive metabolic panel    Lipid panel    Low back pain, unspecified back pain laterality, with sciatica  presence unspecified        Relevant Orders    PSA       I am having Mr. Padmanabhan start on hyoscyamine. I am also having him maintain his Red Yeast Rice Extract, NON FORMULARY, mupirocin ointment, Thiamine HCl (VITAMIN B-1 PO), pimecrolimus, etodolac, pantoprazole, enalapril, and hydrochlorothiazide.  Meds ordered this encounter  Medications  . hyoscyamine (LEVSIN SL) 0.125 MG SL tablet    Sig: Place 1 tablet (0.125 mg total) under the tongue every 4 (four) hours as needed.    Dispense:  30 tablet    Refill:  1     Penni Homans, MD

## 2015-03-31 NOTE — Assessment & Plan Note (Signed)
hgba1c acceptable, minimize simple carbs. Increase exercise as tolerated.  

## 2015-03-31 NOTE — Progress Notes (Signed)
Pre visit review using our clinic review tool, if applicable. No additional management support is needed unless otherwise documented below in the visit note. 

## 2015-04-17 ENCOUNTER — Other Ambulatory Visit: Payer: Self-pay | Admitting: Medical

## 2015-04-17 ENCOUNTER — Other Ambulatory Visit: Payer: Self-pay | Admitting: Internal Medicine

## 2015-05-09 ENCOUNTER — Other Ambulatory Visit: Payer: Self-pay | Admitting: Medical

## 2015-05-16 ENCOUNTER — Other Ambulatory Visit: Payer: Self-pay | Admitting: Internal Medicine

## 2015-06-07 ENCOUNTER — Other Ambulatory Visit: Payer: Self-pay | Admitting: Medical

## 2015-07-05 ENCOUNTER — Other Ambulatory Visit: Payer: Self-pay | Admitting: Medical

## 2015-07-05 ENCOUNTER — Other Ambulatory Visit: Payer: Self-pay | Admitting: Family Medicine

## 2015-07-24 ENCOUNTER — Encounter: Payer: Self-pay | Admitting: Family Medicine

## 2015-07-24 ENCOUNTER — Other Ambulatory Visit: Payer: Self-pay | Admitting: Family Medicine

## 2015-07-24 ENCOUNTER — Other Ambulatory Visit: Payer: Self-pay | Admitting: Internal Medicine

## 2015-07-25 MED ORDER — PANTOPRAZOLE SODIUM 40 MG PO TBEC: DELAYED_RELEASE_TABLET | ORAL | Status: DC

## 2015-08-03 ENCOUNTER — Other Ambulatory Visit: Payer: Self-pay | Admitting: Medical

## 2015-08-10 ENCOUNTER — Encounter: Payer: Self-pay | Admitting: Family Medicine

## 2015-08-15 ENCOUNTER — Telehealth: Payer: Self-pay | Admitting: Internal Medicine

## 2015-08-15 NOTE — Telephone Encounter (Signed)
Spoke with the drug store and the pantoprazole went thru fine.  I called [facility] and told him it went thru fine and that he needs an appointment as it has been years since he has been seen.   He said he has a physical up coming and he will see if his PCP will refill that.

## 2015-08-21 ENCOUNTER — Telehealth: Payer: Self-pay | Admitting: Internal Medicine

## 2015-08-21 NOTE — Telephone Encounter (Signed)
I spoke with the  [facility] and they tried running it thru several different ways, says the insurance will not cover that drug.  I then called Gabriel [name] and he is going to call his insurance company again and find out what they cover and call us back.  His PCP visit is upcoming in June.

## 2015-09-01 ENCOUNTER — Encounter: Payer: Self-pay | Admitting: Family Medicine

## 2015-09-03 ENCOUNTER — Other Ambulatory Visit: Payer: Self-pay | Admitting: Family Medicine

## 2015-09-03 MED ORDER — OMEPRAZOLE 40 MG PO CPDR: 40.0000 mg | DELAYED_RELEASE_CAPSULE | Freq: Every day | ORAL | Status: DC

## 2015-09-03 NOTE — Telephone Encounter (Signed)
Caller name: Marjory Lies @ [facility] in [place] Can be reached: [phone]   Reason for call: Insurance is requiring omeprazole be filled for 90 day supply. Please call with authorization to fill for 90 days or send new RX.

## 2015-09-04 ENCOUNTER — Other Ambulatory Visit: Payer: Self-pay | Admitting: Family Medicine

## 2015-09-09 ENCOUNTER — Other Ambulatory Visit: Payer: Self-pay | Admitting: Family Medicine

## 2015-09-09 MED ORDER — OMEPRAZOLE 40 MG PO CPDR: 40.0000 mg | DELAYED_RELEASE_CAPSULE | Freq: Every day | ORAL | Status: DC

## 2015-09-09 NOTE — Telephone Encounter (Signed)
PCP sent in omeprazole for him, insurance company wouldn't provide him with a list of covered drugs.

## 2015-10-01 ENCOUNTER — Other Ambulatory Visit: Payer: Managed Care, Other (non HMO)

## 2015-10-02 ENCOUNTER — Other Ambulatory Visit (INDEPENDENT_AMBULATORY_CARE_PROVIDER_SITE_OTHER): Payer: Managed Care, Other (non HMO)

## 2015-10-02 DIAGNOSIS — K219 Gastro-esophageal reflux disease without esophagitis: Secondary | ICD-10-CM

## 2015-10-02 DIAGNOSIS — I10 Essential (primary) hypertension: Secondary | ICD-10-CM | POA: Diagnosis not present

## 2015-10-02 DIAGNOSIS — E781 Pure hyperglyceridemia: Secondary | ICD-10-CM

## 2015-10-02 DIAGNOSIS — M545 Low back pain: Secondary | ICD-10-CM

## 2015-10-02 DIAGNOSIS — R739 Hyperglycemia, unspecified: Secondary | ICD-10-CM

## 2015-10-02 LAB — CBC
HCT: 46.5 % (ref 39.0–52.0)
HEMOGLOBIN: 15.7 g/dL (ref 13.0–17.0)
MCHC: 33.7 g/dL (ref 30.0–36.0)
MCV: 92.2 fl (ref 78.0–100.0)
Platelets: 296 10*3/uL (ref 150.0–400.0)
RBC: 5.04 Mil/uL (ref 4.22–5.81)
RDW: 12.6 % (ref 11.5–15.5)
WBC: 5.3 10*3/uL (ref 4.0–10.5)

## 2015-10-02 LAB — COMPREHENSIVE METABOLIC PANEL
ALK PHOS: 48 U/L (ref 39–117)
ALT: 28 U/L (ref 0–53)
AST: 18 U/L (ref 0–37)
Albumin: 4.5 g/dL (ref 3.5–5.2)
BUN: 11 mg/dL (ref 6–23)
CO2: 28 mEq/L (ref 19–32)
CREATININE: 0.86 mg/dL (ref 0.40–1.50)
Calcium: 9.9 mg/dL (ref 8.4–10.5)
Chloride: 102 mEq/L (ref 96–112)
GFR: 95.23 mL/min (ref >60.00)
Glucose, Bld: 98 mg/dL (ref 70–99)
Potassium: 4.4 mEq/L (ref 3.5–5.1)
Sodium: 132 mEq/L — ABNORMAL LOW (ref 135–145)
TOTAL PROTEIN: 6.8 g/dL (ref 6.0–8.3)
Total Bilirubin: 1.1 mg/dL (ref 0.2–1.2)

## 2015-10-02 LAB — LIPID PANEL
CHOLESTEROL: 197 mg/dL (ref 0–200)
HDL: 54.8 mg/dL (ref >39.00)
LDL Cholesterol: 125 mg/dL — ABNORMAL HIGH (ref 0–99)
NonHDL: 142.63
TRIGLYCERIDES: 90 mg/dL (ref 0.0–149.0)
Total CHOL/HDL Ratio: 4
VLDL: 18 mg/dL (ref 0.0–40.0)

## 2015-10-02 LAB — PSA: PSA: 3.27 ng/mL (ref 0.10–4.00)

## 2015-10-02 LAB — TSH: TSH: 0.88 u[IU]/mL (ref 0.35–4.50)

## 2015-10-03 ENCOUNTER — Telehealth: Payer: Self-pay | Admitting: Family Medicine

## 2015-10-03 NOTE — Telephone Encounter (Signed)
Caller name: Don Relation to pt: self  Call back number: [phone] Pharmacy:  Reason for call: Pt returning call and stated if it was regarding for his lab results that he already saw his results on mychart and also stated that will see Dr Charlett Blake next wk, pt would like to know if that is ok. Please advise.

## 2015-10-03 NOTE — Telephone Encounter (Signed)
Patient informed ok

## 2015-10-07 ENCOUNTER — Encounter: Payer: Self-pay | Admitting: Family Medicine

## 2015-10-07 ENCOUNTER — Ambulatory Visit (INDEPENDENT_AMBULATORY_CARE_PROVIDER_SITE_OTHER): Payer: Managed Care, Other (non HMO) | Admitting: Family Medicine

## 2015-10-07 VITALS — BP 132/70 | HR 82 | Temp 98.5°F | Ht 67.0 in | Wt 185.1 lb

## 2015-10-07 DIAGNOSIS — K2100 Gastro-esophageal reflux disease with esophagitis, without bleeding: Secondary | ICD-10-CM

## 2015-10-07 DIAGNOSIS — Z0000 Encounter for general adult medical examination without abnormal findings: Secondary | ICD-10-CM

## 2015-10-07 DIAGNOSIS — E785 Hyperlipidemia, unspecified: Secondary | ICD-10-CM

## 2015-10-07 DIAGNOSIS — K222 Esophageal obstruction: Secondary | ICD-10-CM | POA: Diagnosis not present

## 2015-10-07 DIAGNOSIS — E871 Hypo-osmolality and hyponatremia: Secondary | ICD-10-CM

## 2015-10-07 DIAGNOSIS — E663 Overweight: Secondary | ICD-10-CM

## 2015-10-07 DIAGNOSIS — L989 Disorder of the skin and subcutaneous tissue, unspecified: Secondary | ICD-10-CM | POA: Diagnosis not present

## 2015-10-07 DIAGNOSIS — R1310 Dysphagia, unspecified: Secondary | ICD-10-CM

## 2015-10-07 DIAGNOSIS — I10 Essential (primary) hypertension: Secondary | ICD-10-CM

## 2015-10-07 DIAGNOSIS — K220 Achalasia of cardia: Secondary | ICD-10-CM

## 2015-10-07 DIAGNOSIS — R739 Hyperglycemia, unspecified: Secondary | ICD-10-CM

## 2015-10-07 DIAGNOSIS — M25552 Pain in left hip: Secondary | ICD-10-CM

## 2015-10-07 HISTORY — DX: Disorder of the skin and subcutaneous tissue, unspecified: L98.9

## 2015-10-07 MED ORDER — RANITIDINE HCL 300 MG PO TABS: 300.0000 mg | ORAL_TABLET | Freq: Every day | ORAL | Status: DC

## 2015-10-07 MED ORDER — OMEPRAZOLE 40 MG PO CPDR: 40.0000 mg | DELAYED_RELEASE_CAPSULE | Freq: Every day | ORAL | Status: DC

## 2015-10-07 NOTE — Patient Instructions (Signed)
Call and confirm insurance covers Zostavax shot to protect against shingles if so can call and schedule a nurse visit for shot  Preventive Care for Adults, Male A healthy lifestyle and preventive care can promote health and wellness. Preventive health guidelines for men include the following key practices:  A routine yearly physical is a good way to check with your health care provider about your health and preventative screening. It is a chance to share any concerns and updates on your health and to receive a thorough exam.  Visit your dentist for a routine exam and preventative care every 6 months. Brush your teeth twice a day and floss once a day. Good oral hygiene prevents tooth decay and gum disease.  The frequency of eye exams is based on your age, health, family medical history, use of contact lenses, and other factors. Follow your health care provider's recommendations for frequency of eye exams.  Eat a healthy diet. Foods such as vegetables, fruits, whole grains, low-fat dairy products, and lean protein foods contain the nutrients you need without too many calories. Decrease your intake of foods high in solid fats, added sugars, and salt. Eat the right amount of calories for you.Get information about a proper diet from your health care provider, if necessary.  Regular physical exercise is one of the most important things you can do for your health. Most adults should get at least 150 minutes of moderate-intensity exercise (any activity that increases your heart rate and causes you to sweat) each week. In addition, most adults need muscle-strengthening exercises on 2 or more days a week.  Maintain a healthy weight. The body mass index (BMI) is a screening tool to identify possible weight problems. It provides an estimate of body fat based on height and weight. Your health care provider can find your BMI and can help you achieve or maintain a healthy weight.For adults 20 years and older:  A  BMI below 18.5 is considered underweight.  A BMI of 18.5 to 24.9 is normal.  A BMI of 25 to 29.9 is considered overweight.  A BMI of 30 and above is considered obese.  Maintain normal blood lipids and cholesterol levels by exercising and minimizing your intake of saturated fat. Eat a balanced diet with plenty of fruit and vegetables. Blood tests for lipids and cholesterol should begin at age 30 and be repeated every 5 years. If your lipid or cholesterol levels are high, you are over 50, or you are at high risk for heart disease, you may need your cholesterol levels checked more frequently.Ongoing high lipid and cholesterol levels should be treated with medicines if diet and exercise are not working.  If you smoke, find out from your health care provider how to quit. If you do not use tobacco, do not start.  Lung cancer screening is recommended for adults aged 48-80 years who are at high risk for developing lung cancer because of a history of smoking. A yearly low-dose CT scan of the lungs is recommended for people who have at least a 30-pack-year history of smoking and are a current smoker or have quit within the past 15 years. A pack year of smoking is smoking an average of 1 pack of cigarettes a day for 1 year (for example: 1 pack a day for 30 years or 2 packs a day for 15 years). Yearly screening should continue until the smoker has stopped smoking for at least 15 years. Yearly screening should be stopped for people who develop  a health problem that would prevent them from having lung cancer treatment.  If you choose to drink alcohol, do not have more than 2 drinks per day. One drink is considered to be 12 ounces (355 mL) of beer, 5 ounces (148 mL) of wine, or 1.5 ounces (44 mL) of liquor.  Avoid use of street drugs. Do not share needles with anyone. Ask for help if you need support or instructions about stopping the use of drugs.  High blood pressure causes heart disease and increases the risk  of stroke. Your blood pressure should be checked at least every 1-2 years. Ongoing high blood pressure should be treated with medicines, if weight loss and exercise are not effective.  If you are 71-49 years old, ask your health care provider if you should take aspirin to prevent heart disease.  Diabetes screening is done by taking a blood sample to check your blood glucose level after you have not eaten for a certain period of time (fasting). If you are not overweight and you do not have risk factors for diabetes, you should be screened once every 3 years starting at age 59. If you are overweight or obese and you are 65-66 years of age, you should be screened for diabetes every year as part of your cardiovascular risk assessment.  Colorectal cancer can be detected and often prevented. Most routine colorectal cancer screening begins at the age of 40 and continues through age 19. However, your health care provider may recommend screening at an earlier age if you have risk factors for colon cancer. On a yearly basis, your health care provider may provide home test kits to check for hidden blood in the stool. Use of a small camera at the end of a tube to directly examine the colon (sigmoidoscopy or colonoscopy) can detect the earliest forms of colorectal cancer. Talk to your health care provider about this at age 15, when routine screening begins. Direct exam of the colon should be repeated every 5-10 years through age 64, unless early forms of precancerous polyps or small growths are found.  People who are at an increased risk for hepatitis B should be screened for this virus. You are considered at high risk for hepatitis B if:  You were born in a country where hepatitis B occurs often. Talk with your health care provider about which countries are considered high risk.  Your parents were born in a high-risk country and you have not received a shot to protect against hepatitis B (hepatitis B  vaccine).  You have HIV or AIDS.  You use needles to inject street drugs.  You live with, or have sex with, someone who has hepatitis B.  You are a man who has sex with other men (MSM).  You get hemodialysis treatment.  You take certain medicines for conditions such as cancer, organ transplantation, and autoimmune conditions.  Hepatitis C blood testing is recommended for all people born from [birth year] through 1965 and any individual with known risks for hepatitis C.  Practice safe sex. Use condoms and avoid high-risk sexual practices to reduce the spread of sexually transmitted infections (STIs). STIs include gonorrhea, chlamydia, syphilis, trichomonas, herpes, HPV, and human immunodeficiency virus (HIV). Herpes, HIV, and HPV are viral illnesses that have no cure. They can result in disability, cancer, and death.  If you are a man who has sex with other men, you should be screened at least once per year for:  HIV.  Urethral, rectal, and pharyngeal infection  of gonorrhea, chlamydia, or both.  If you are at risk of being infected with HIV, it is recommended that you take a prescription medicine daily to prevent HIV infection. This is called preexposure prophylaxis (PrEP). You are considered at risk if:  You are a man who has sex with other men (MSM) and have other risk factors.  You are a heterosexual man, are sexually active, and are at increased risk for HIV infection.  You take drugs by injection.  You are sexually active with a partner who has HIV.  Talk with your health care provider about whether you are at high risk of being infected with HIV. If you choose to begin PrEP, you should first be tested for HIV. You should then be tested every 3 months for as long as you are taking PrEP.  A one-time screening for abdominal aortic aneurysm (AAA) and surgical repair of large AAAs by ultrasound are recommended for men ages 48 to 70 years who are current or former smokers.  Healthy men  should no longer receive prostate-specific antigen (PSA) blood tests as part of routine cancer screening. Talk with your health care provider about prostate cancer screening.  Testicular cancer screening is not recommended for adult males who have no symptoms. Screening includes self-exam, a health care provider exam, and other screening tests. Consult with your health care provider about any symptoms you have or any concerns you have about testicular cancer.  Use sunscreen. Apply sunscreen liberally and repeatedly throughout the day. You should seek shade when your shadow is shorter than you. Protect yourself by wearing long sleeves, pants, a wide-brimmed hat, and sunglasses year round, whenever you are outdoors.  Once a month, do a whole-body skin exam, using a mirror to look at the skin on your back. Tell your health care provider about new moles, moles that have irregular borders, moles that are larger than a pencil eraser, or moles that have changed in shape or color.  Stay current with required vaccines (immunizations).  Influenza vaccine. All adults should be immunized every year.  Tetanus, diphtheria, and acellular pertussis (Td, Tdap) vaccine. An adult who has not previously received Tdap or who does not know his vaccine status should receive 1 dose of Tdap. This initial dose should be followed by tetanus and diphtheria toxoids (Td) booster doses every 10 years. Adults with an unknown or incomplete history of completing a 3-dose immunization series with Td-containing vaccines should begin or complete a primary immunization series including a Tdap dose. Adults should receive a Td booster every 10 years.  Varicella vaccine. An adult without evidence of immunity to varicella should receive 2 doses or a second dose if he has previously received 1 dose.  Human papillomavirus (HPV) vaccine. Males aged 11-21 years who have not received the vaccine previously should receive the 3-dose series. Males  aged 22-26 years may be immunized. Immunization is recommended through the age of 22 years for any male who has sex with males and did not get any or all doses earlier. Immunization is recommended for any person with an immunocompromised condition through the age of 41 years if he did not get any or all doses earlier. During the 3-dose series, the second dose should be obtained 4-8 weeks after the first dose. The third dose should be obtained 24 weeks after the first dose and 16 weeks after the second dose.  Zoster vaccine. One dose is recommended for adults aged 2 years or older unless certain conditions are present.  Measles, mumps, and rubella (MMR) vaccine. Adults born before [birth year] generally are considered immune to measles and mumps. Adults born in [birth year] or later should have 1 or more doses of MMR vaccine unless there is a contraindication to the vaccine or there is laboratory evidence of immunity to each of the three diseases. A routine second dose of MMR vaccine should be obtained at least 28 days after the first dose for students attending postsecondary schools, health care workers, or international travelers. People who received inactivated measles vaccine or an unknown type of measles vaccine during 1963-1967 should receive 2 doses of MMR vaccine. People who received inactivated mumps vaccine or an unknown type of mumps vaccine before 1979 and are at high risk for mumps infection should consider immunization with 2 doses of MMR vaccine. Unvaccinated health care workers born before [birth year] who lack laboratory evidence of measles, mumps, or rubella immunity or laboratory confirmation of disease should consider measles and mumps immunization with 2 doses of MMR vaccine or rubella immunization with 1 dose of MMR vaccine.  Pneumococcal 13-valent conjugate (PCV13) vaccine. When indicated, a person who is uncertain of his immunization history and has no record of immunization should receive the PCV13 vaccine.  All adults 45 years of age and older should receive this vaccine. An adult aged 71 years or older who has certain medical conditions and has not been previously immunized should receive 1 dose of PCV13 vaccine. This PCV13 should be followed with a dose of pneumococcal polysaccharide (PPSV23) vaccine. Adults who are at high risk for pneumococcal disease should obtain the PPSV23 vaccine at least 8 weeks after the dose of PCV13 vaccine. Adults older than 64 years of age who have normal immune system function should obtain the PPSV23 vaccine dose at least 1 year after the dose of PCV13 vaccine.  Pneumococcal polysaccharide (PPSV23) vaccine. When PCV13 is also indicated, PCV13 should be obtained first. All adults aged 63 years and older should be immunized. An adult younger than age 26 years who has certain medical conditions should be immunized. Any person who resides in a nursing home or long-term care facility should be immunized. An adult smoker should be immunized. People with an immunocompromised condition and certain other conditions should receive both PCV13 and PPSV23 vaccines. People with human immunodeficiency virus (HIV) infection should be immunized as soon as possible after diagnosis. Immunization during chemotherapy or radiation therapy should be avoided. Routine use of PPSV23 vaccine is not recommended for American Indians, [place] Natives, or people younger than 65 years unless there are medical conditions that require PPSV23 vaccine. When indicated, people who have unknown immunization and have no record of immunization should receive PPSV23 vaccine. One-time revaccination 5 years after the first dose of PPSV23 is recommended for people aged 19-64 years who have chronic kidney failure, nephrotic syndrome, asplenia, or immunocompromised conditions. People who received 1-2 doses of PPSV23 before age 37 years should receive another dose of PPSV23 vaccine at age 61 years or later if at least 5 years have  passed since the previous dose. Doses of PPSV23 are not needed for people immunized with PPSV23 at or after age 20 years.  Meningococcal vaccine. Adults with asplenia or persistent complement component deficiencies should receive 2 doses of quadrivalent meningococcal conjugate (MenACWY-D) vaccine. The doses should be obtained at least 2 months apart. Microbiologists working with certain meningococcal bacteria, [employer] recruits, people at risk during an outbreak, and people who travel to or live in countries with a high rate of meningitis  should be immunized. A first-year college student up through age 74 years who is living in a residence hall should receive a dose if he did not receive a dose on or after his 16th birthday. Adults who have certain high-risk conditions should receive one or more doses of vaccine.  Hepatitis A vaccine. Adults who wish to be protected from this disease, have chronic liver disease, work with hepatitis A-infected animals, work in hepatitis A research labs, or travel to or work in countries with a high rate of hepatitis A should be immunized. Adults who were previously unvaccinated and who anticipate close contact with an international adoptee during the first 60 days after arrival in the [place] from a country with a high rate of hepatitis A should be immunized.  Hepatitis B vaccine. Adults should be immunized if they wish to be protected from this disease, are under age 27 years and have diabetes, have chronic liver disease, have had more than one sex partner in the past 6 months, may be exposed to blood or other infectious body fluids, are household contacts or sex partners of hepatitis B positive people, are clients or workers in certain care facilities, or travel to or work in countries with a high rate of hepatitis B.  Haemophilus influenzae type b (Hib) vaccine. A previously unvaccinated person with asplenia or sickle cell disease or having a scheduled splenectomy  should receive 1 dose of Hib vaccine. Regardless of previous immunization, a recipient of a hematopoietic stem cell transplant should receive a 3-dose series 6-12 months after his successful transplant. Hib vaccine is not recommended for adults with HIV infection. Preventive Service / Frequency Ages 64 to 86  Blood pressure check.** / Every 3-5 years.  Lipid and cholesterol check.** / Every 5 years beginning at age 48.  Hepatitis C blood test.** / For any individual with known risks for hepatitis C.  Skin self-exam. / Monthly.  Influenza vaccine. / Every year.  Tetanus, diphtheria, and acellular pertussis (Tdap, Td) vaccine.** / Consult your health care provider. 1 dose of Td every 10 years.  Varicella vaccine.** / Consult your health care provider.  HPV vaccine. / 3 doses over 6 months, if 3 or younger.  Measles, mumps, rubella (MMR) vaccine.** / You need at least 1 dose of MMR if you were born in 1957 or later. You may also need a second dose.  Pneumococcal 13-valent conjugate (PCV13) vaccine.** / Consult your health care provider.  Pneumococcal polysaccharide (PPSV23) vaccine.** / 1 to 2 doses if you smoke cigarettes or if you have certain conditions.  Meningococcal vaccine.** / 1 dose if you are age 70 to 76 years and a [employer] researcher living in a residence hall, or have one of several medical conditions. You may also need additional booster doses.  Hepatitis A vaccine.** / Consult your health care provider.  Hepatitis B vaccine.** / Consult your health care provider.  Haemophilus influenzae type b (Hib) vaccine.** / Consult your health care provider. Ages 61 to 46  Blood pressure check.** / Every year.  Lipid and cholesterol check.** / Every 5 years beginning at age 59.  Lung cancer screening. / Every year if you are aged 65-80 years and have a 30-pack-year history of smoking and currently smoke or have quit within the past 15 years. Yearly screening is stopped  once you have quit smoking for at least 15 years or develop a health problem that would prevent you from having lung cancer treatment.  Fecal occult blood  test (FOBT) of stool. / Every year beginning at age 39 and continuing until age 64. You may not have to do this test if you get a colonoscopy every 10 years.  Flexible sigmoidoscopy** or colonoscopy.** / Every 5 years for a flexible sigmoidoscopy or every 10 years for a colonoscopy beginning at age 24 and continuing until age 31.  Hepatitis C blood test.** / For all people born from [birth year] through 1965 and any individual with known risks for hepatitis C.  Skin self-exam. / Monthly.  Influenza vaccine. / Every year.  Tetanus, diphtheria, and acellular pertussis (Tdap/Td) vaccine.** / Consult your health care provider. 1 dose of Td every 10 years.  Varicella vaccine.** / Consult your health care provider.  Zoster vaccine.** / 1 dose for adults aged 55 years or older.  Measles, mumps, rubella (MMR) vaccine.** / You need at least 1 dose of MMR if you were born in 1956 or later. You may also need a second dose.  Pneumococcal 13-valent conjugate (PCV13) vaccine.** / Consult your health care provider.  Pneumococcal polysaccharide (PPSV23) vaccine.** / 1 to 2 doses if you smoke cigarettes or if you have certain conditions.  Meningococcal vaccine.** / Consult your health care provider.  Hepatitis A vaccine.** / Consult your health care provider.  Hepatitis B vaccine.** / Consult your health care provider.  Haemophilus influenzae type b (Hib) vaccine.** / Consult your health care provider. Ages 67 and over  Blood pressure check.** / Every year.  Lipid and cholesterol check.**/ Every 5 years beginning at age 38.  Lung cancer screening. / Every year if you are aged 28-80 years and have a 30-pack-year history of smoking and currently smoke or have quit within the past 15 years. Yearly screening is stopped once you have quit smoking for at  least 15 years or develop a health problem that would prevent you from having lung cancer treatment.  Fecal occult blood test (FOBT) of stool. / Every year beginning at age 56 and continuing until age 36. You may not have to do this test if you get a colonoscopy every 10 years.  Flexible sigmoidoscopy** or colonoscopy.** / Every 5 years for a flexible sigmoidoscopy or every 10 years for a colonoscopy beginning at age 37 and continuing until age 54.  Hepatitis C blood test.** / For all people born from [birth year] through 1965 and any individual with known risks for hepatitis C.  Abdominal aortic aneurysm (AAA) screening.** / A one-time screening for ages 61 to 22 years who are current or former smokers.  Skin self-exam. / Monthly.  Influenza vaccine. / Every year.  Tetanus, diphtheria, and acellular pertussis (Tdap/Td) vaccine.** / 1 dose of Td every 10 years.  Varicella vaccine.** / Consult your health care provider.  Zoster vaccine.** / 1 dose for adults aged 1 years or older.  Pneumococcal 13-valent conjugate (PCV13) vaccine.** / 1 dose for all adults aged 54 years and older.  Pneumococcal polysaccharide (PPSV23) vaccine.** / 1 dose for all adults aged 34 years and older.  Meningococcal vaccine.** / Consult your health care provider.  Hepatitis A vaccine.** / Consult your health care provider.  Hepatitis B vaccine.** / Consult your health care provider.  Haemophilus influenzae type b (Hib) vaccine.** / Consult your health care provider. **Family history and personal history of risk and conditions may change your health care provider's recommendations.   This information is not intended to replace advice given to you by your health care provider. Make sure you discuss any questions you  have with your health care provider.   Document Released: 05/25/2001 Document Revised: 04/19/2014 Document Reviewed: 08/24/2010 Elsevier Interactive Patient Education [employer].

## 2015-10-07 NOTE — Assessment & Plan Note (Addendum)
Is considering hip surgery. Pain is 9.5 of 10 on a rough day. Is following with Dr Lyndal Pulley at [facility] and will let us know if he decides to pursue surgery

## 2015-10-07 NOTE — Progress Notes (Signed)
Pre visit review using our clinic review tool, if applicable. No additional management support is needed unless otherwise documented below in the visit note. 

## 2015-10-10 ENCOUNTER — Other Ambulatory Visit: Payer: Self-pay | Admitting: Family Medicine

## 2015-10-10 DIAGNOSIS — K2100 Gastro-esophageal reflux disease with esophagitis, without bleeding: Secondary | ICD-10-CM

## 2015-10-10 DIAGNOSIS — K220 Achalasia of cardia: Secondary | ICD-10-CM

## 2015-10-10 DIAGNOSIS — R1310 Dysphagia, unspecified: Secondary | ICD-10-CM

## 2015-10-10 DIAGNOSIS — K222 Esophageal obstruction: Secondary | ICD-10-CM

## 2015-10-10 MED ORDER — OMEPRAZOLE 40 MG PO CPDR: 40.0000 mg | DELAYED_RELEASE_CAPSULE | Freq: Every day | ORAL | Status: DC

## 2015-10-12 NOTE — Assessment & Plan Note (Signed)
minimize simple carbs. Increase exercise as tolerated.  

## 2015-10-12 NOTE — Assessment & Plan Note (Signed)
Patient encouraged to maintain heart healthy diet, regular exercise, adequate sleep. Consider daily probiotics. Take medications as prescribed. Given and reviewed copy of ACP documents from [place] Secretary of State and encouraged to complete and return 

## 2015-10-12 NOTE — Progress Notes (Signed)
Patient ID: Gabriel [name], male   DOB: 1950-10-19, 65 y.o.   MRN: [id number]   Subjective:    Patient ID: Gabriel [name], male    DOB: Jun 13, 1950, 65 y.o.   MRN: [id number]  Chief Complaint  Patient presents with  . Annual Exam    HPI Patient is in today for annual exam is continuing to struggle with left hip pain and left flank pain as well. Worse with certain movements. No falls but is considering proceeding with surgery due to intensity of pain. Has struggled with insurance and getting reflux due to difficulty getting his medications. Denies CP/palp/SOB/HA/congestion/fevers/GI or GU c/o. Taking meds as prescribed  Past Medical History  Diagnosis Date  . Hypertension   . Esophageal reflux 05/21/2012  . Overweight 11/14/2012  . Hyperlipidemia   . Arthritis   . Achalasia   . Ulcer     esophageal ulcer hx  . Insomnia 01/09/2014  . Eczema 01/09/2014  . Left hip pain 08/11/2014  . Colon cancer screening 08/11/2014    Sees [facility], Dr Carlean Purl   . Otitis, externa, infective 03/31/2015  . Skin lesion of left leg 10/07/2015    Past Surgical History  Procedure Laterality Date  . Heller myotomy  1999    help with swallowing  . Tonsillectomy and adenoidectomy  1963  . Colonoscopy    . Esophagogastroduodenoscopy    . Upper gastrointestinal endoscopy      Family History  Problem Relation Age of Onset  . Alcohol abuse Mother   . Ovarian cancer Mother   . Diabetes Maternal Grandmother   . Alcohol abuse Father   . Lung cancer Father   . Colon cancer Neg Hx   . Esophageal cancer Neg Hx   . Rectal cancer Neg Hx   . Stomach cancer Neg Hx   . Mental illness Sister   . Heart disease Brother     congenital heart  . Arthritis Sister     Social History   Social History  . Marital Status: Married    Spouse Name: N/A  . Number of Children: 2  . Years of Education: N/A   Occupational History  . sales/distribution    Social History Main Topics  . Smoking status: Former Smoker    Types:  Cigarettes    Quit date: 04/13/1991  . Smokeless tobacco: Never Used     Comment: 1 ppd for 25 years quit 53'  . Alcohol Use: 4.2 oz/week    7 Glasses of wine per week     Comment: Wine nightly  . Drug Use: No  . Sexual Activity: Not on file   Other Topics Concern  . Not on file   Social History Narrative   Married 2 daughters   Scientific laboratory technician for [employer]          Outpatient Prescriptions Prior to Visit  Medication Sig Dispense Refill  . enalapril (VASOTEC) 10 MG tablet Take 1 tablet (10 mg total) by mouth daily. 90 tablet 1  . enalapril (VASOTEC) 10 MG tablet TAKE 1 TABLET (10 MG TOTAL) BY MOUTH DAILY. 90 tablet 2  . etodolac (LODINE) 500 MG tablet TAKE 1 TABLET BY MOUTH TWICE A DAY 60 tablet 5  . etodolac (LODINE) 500 MG tablet TAKE 1 TABLET BY MOUTH TWICE A DAY 60 tablet 2  . hydrochlorothiazide (HYDRODIURIL) 12.5 MG tablet TAKE 1 TABLET (12.5 MG TOTAL) BY MOUTH DAILY. 30 tablet 0  . hyoscyamine (LEVSIN SL) 0.125 MG SL tablet  Place 1 tablet (0.125 mg total) under the tongue every 4 (four) hours as needed. 30 tablet 1  . mupirocin ointment (BACTROBAN) 2 % Apply to b/l nares qhs x 7 days then as needed 22 g 1  . NON FORMULARY Take by mouth daily. LifeGuard Antioxidant.    . pimecrolimus (ELIDEL) 1 % cream Apply 1 application topically daily. 60 g 5  . Red Yeast Rice Extract 600 MG TABS Take by mouth daily.    . Thiamine HCl (VITAMIN B-1 PO) Take 1 capsule by mouth daily.    Marland Kitchen omeprazole (PRILOSEC) 40 MG capsule Take 1 capsule (40 mg total) by mouth daily. 90 capsule 2   No facility-administered medications prior to visit.    No Known Allergies  Review of Systems  Constitutional: Negative for fever, chills and malaise/fatigue.  HENT: Negative for congestion and hearing loss.   Eyes: Negative for discharge.  Respiratory: Negative for cough, sputum production and shortness of breath.   Cardiovascular: Negative for chest pain, palpitations and leg  swelling.  Gastrointestinal: Positive for heartburn. Negative for nausea, vomiting, abdominal pain, diarrhea, constipation and blood in stool.  Genitourinary: Negative for dysuria, urgency, frequency and hematuria.  Musculoskeletal: Positive for joint pain. Negative for myalgias, back pain and falls.  Skin: Negative for rash.  Neurological: Negative for dizziness, sensory change, loss of consciousness, weakness and headaches.  Endo/Heme/Allergies: Negative for environmental allergies. Does not bruise/bleed easily.  Psychiatric/Behavioral: Negative for depression and suicidal ideas. The patient is not nervous/anxious and does not have insomnia.        Objective:    Physical Exam  Constitutional: He is oriented to person, place, and time. He appears well-developed and well-nourished. No distress.  HENT:  Head: Normocephalic and atraumatic.  Eyes: Conjunctivae are normal.  Neck: Neck supple. No thyromegaly present.  Cardiovascular: Normal rate, regular rhythm and normal heart sounds.   No murmur heard. Pulmonary/Chest: Effort normal and breath sounds normal. No respiratory distress. He has no wheezes.  Abdominal: Soft. Bowel sounds are normal. He exhibits no mass. There is no tenderness.  Musculoskeletal: He exhibits no edema.  Lymphadenopathy:    He has no cervical adenopathy.  Neurological: He is alert and oriented to person, place, and time.  Skin: Skin is warm and dry.  Psychiatric: He has a normal mood and affect. His behavior is normal.    BP 132/70 mmHg  Pulse 82  Temp(Src) 98.5 F (36.9 C) (Oral)  Ht _0  (1.702 m)  Wt 185 lb 2 oz (83.972 kg)  BMI 28.99 kg/m2  SpO2 97% Wt Readings from Last 3 Encounters:  10/07/15 185 lb 2 oz (83.972 kg)  03/31/15 187 lb (84.823 kg)  02/20/15 189 lb (85.73 kg)     Lab Results  Component Value Date   WBC 5.3 10/02/2015   HGB 15.7 10/02/2015   HCT 46.5 10/02/2015   PLT 296.0 10/02/2015   GLUCOSE 98 10/02/2015   CHOL 197  10/02/2015   TRIG 90.0 10/02/2015   HDL 54.80 10/02/2015   LDLCALC 125* 10/02/2015   ALT 28 10/02/2015   AST 18 10/02/2015   NA 132* 10/02/2015   K 4.4 10/02/2015   CL 102 10/02/2015   CREATININE 0.86 10/02/2015   BUN 11 10/02/2015   CO2 28 10/02/2015   TSH 0.88 10/02/2015   PSA 3.27 10/02/2015   HGBA1C 5.7 07/30/2014    Lab Results  Component Value Date   TSH 0.88 10/02/2015   Lab Results  Component Value Date  WBC 5.3 10/02/2015   HGB 15.7 10/02/2015   HCT 46.5 10/02/2015   MCV 92.2 10/02/2015   PLT 296.0 10/02/2015   Lab Results  Component Value Date   NA 132* 10/02/2015   K 4.4 10/02/2015   CO2 28 10/02/2015   GLUCOSE 98 10/02/2015   BUN 11 10/02/2015   CREATININE 0.86 10/02/2015   BILITOT 1.1 10/02/2015   ALKPHOS 48 10/02/2015   AST 18 10/02/2015   ALT 28 10/02/2015   PROT 6.8 10/02/2015   ALBUMIN 4.5 10/02/2015   CALCIUM 9.9 10/02/2015   GFR 95.23 10/02/2015   Lab Results  Component Value Date   CHOL 197 10/02/2015   Lab Results  Component Value Date   HDL 54.80 10/02/2015   Lab Results  Component Value Date   LDLCALC 125* 10/02/2015   Lab Results  Component Value Date   TRIG 90.0 10/02/2015   Lab Results  Component Value Date   CHOLHDL 4 10/02/2015   Lab Results  Component Value Date   HGBA1C 5.7 07/30/2014       Assessment & Plan:   Problem List Items Addressed This Visit    HTN (hypertension)    Well controlled, no changes to meds. Encouraged heart healthy diet such as the DASH diet and exercise as tolerated.       Annual physical exam    Patient encouraged to maintain heart healthy diet, regular exercise, adequate sleep. Consider daily probiotics. Take medications as prescribed. Given and reviewed copy of ACP documents from [employer] and encouraged to complete and return      Hyperlipidemia    Encouraged heart healthy diet, increase exercise, avoid trans fats, consider a krill oil cap daily      Esophageal  reflux - Primary    Avoid offending foods, take probiotics. Do not eat large meals in late evening and consider raising head of bed.       Relevant Medications   ranitidine (ZANTAC) 300 MG tablet   Overweight    Encouraged regular exercise, DASH diet, quality sleep and good hydration.      Esophageal stricture   Relevant Medications   ranitidine (ZANTAC) 300 MG tablet   Hyperglycemia    minimize simple carbs. Increase exercise as tolerated.      Left hip pain    Is considering hip surgery. Pain is 9.5 of 10 on a rough day. Is following with Dr Lyndal Pulley at [facility] and will let us know if he decides to pursue surgery      Skin lesion of left leg   Relevant Orders   Ambulatory referral to Dermatology    Other Visit Diagnoses    Dysphagia        Relevant Medications    ranitidine (ZANTAC) 300 MG tablet    Achalasia of esophagus        Relevant Medications    ranitidine (ZANTAC) 300 MG tablet    Hyponatremia        Relevant Orders    Comp Met (CMET)       I have discontinued Mr. Kuehl omeprazole. I am also having him start on ranitidine. Additionally, I am having him maintain his Red Yeast Rice Extract, NON FORMULARY, mupirocin ointment, Thiamine HCl (VITAMIN B-1 PO), pimecrolimus, etodolac, enalapril, hyoscyamine, enalapril, etodolac, and hydrochlorothiazide.  Meds ordered this encounter  Medications  . ranitidine (ZANTAC) 300 MG tablet    Sig: Take 1 tablet (300 mg total) by mouth at bedtime.  Dispense:  30 tablet    Refill:  5  . DISCONTD: omeprazole (PRILOSEC) 40 MG capsule    Sig: Take 1 capsule (40 mg total) by mouth daily.    Dispense:  30 capsule    Refill:  5     Penni Homans, MD

## 2015-10-12 NOTE — Assessment & Plan Note (Signed)
Encouraged heart healthy diet, increase exercise, avoid trans fats, consider a krill oil cap daily 

## 2015-10-12 NOTE — Assessment & Plan Note (Signed)
Encouraged regular exercise, DASH diet, quality sleep and good hydration.

## 2015-10-12 NOTE — Assessment & Plan Note (Signed)
Avoid offending foods, take probiotics. Do not eat large meals in late evening and consider raising head of bed.  

## 2015-10-12 NOTE — Assessment & Plan Note (Signed)
Well controlled, no changes to meds. Encouraged heart healthy diet such as the DASH diet and exercise as tolerated.  °

## 2015-10-13 ENCOUNTER — Encounter: Payer: Self-pay | Admitting: *Deleted

## 2015-10-24 ENCOUNTER — Other Ambulatory Visit: Payer: Self-pay | Admitting: Family Medicine

## 2015-11-17 ENCOUNTER — Other Ambulatory Visit: Payer: Self-pay | Admitting: Family Medicine

## 2015-11-22 ENCOUNTER — Other Ambulatory Visit: Payer: Self-pay | Admitting: Family Medicine

## 2015-11-22 DIAGNOSIS — I10 Essential (primary) hypertension: Secondary | ICD-10-CM

## 2015-11-22 DIAGNOSIS — E781 Pure hyperglyceridemia: Secondary | ICD-10-CM

## 2015-11-22 DIAGNOSIS — K219 Gastro-esophageal reflux disease without esophagitis: Secondary | ICD-10-CM

## 2015-11-22 DIAGNOSIS — R739 Hyperglycemia, unspecified: Secondary | ICD-10-CM

## 2015-11-24 ENCOUNTER — Encounter: Payer: Self-pay | Admitting: Family Medicine

## 2015-11-24 MED ORDER — PANTOPRAZOLE SODIUM 40 MG PO TBEC: 40.0000 mg | DELAYED_RELEASE_TABLET | Freq: Every day | ORAL | 1 refills | Status: DC

## 2015-11-24 NOTE — Addendum Note (Signed)
Addended by: Tasia Catchings on: 11/24/2015 05:32 PM   Modules accepted: Orders

## 2015-11-25 ENCOUNTER — Other Ambulatory Visit: Payer: Self-pay | Admitting: Family Medicine

## 2015-12-02 ENCOUNTER — Telehealth: Payer: Self-pay | Admitting: Family Medicine

## 2015-12-02 NOTE — Telephone Encounter (Signed)
Relation to [id number] Call back number:[phone]   Reason for call:  Patient states shingles vaccination is covered 100% by insurance as long as the dx is preventative. Requesting orders patient scheduled nurse visit for 12/03/15.

## 2015-12-03 ENCOUNTER — Ambulatory Visit (INDEPENDENT_AMBULATORY_CARE_PROVIDER_SITE_OTHER): Payer: Managed Care, Other (non HMO) | Admitting: *Deleted

## 2015-12-03 DIAGNOSIS — Z0000 Encounter for general adult medical examination without abnormal findings: Secondary | ICD-10-CM | POA: Diagnosis not present

## 2015-12-03 DIAGNOSIS — Z23 Encounter for immunization: Secondary | ICD-10-CM

## 2015-12-03 NOTE — Telephone Encounter (Signed)
Per 10/07/15 AVS: Call and confirm insurance covers Zostavax shot to protect against shingles if so can call and schedule a nurse visit for shot

## 2015-12-03 NOTE — Progress Notes (Signed)
Pre visit review using our clinic review tool, if applicable. No additional management support is needed unless otherwise documented below in the visit note.  Patient tolerated injection well.  Cassian Torelli J Anissia Wessells, RN 

## 2015-12-15 ENCOUNTER — Other Ambulatory Visit: Payer: Self-pay | Admitting: Family Medicine

## 2016-01-24 ENCOUNTER — Other Ambulatory Visit: Payer: Self-pay | Admitting: Family Medicine

## 2016-02-12 ENCOUNTER — Other Ambulatory Visit: Payer: Self-pay | Admitting: Family Medicine

## 2016-02-12 DIAGNOSIS — R739 Hyperglycemia, unspecified: Secondary | ICD-10-CM

## 2016-02-12 DIAGNOSIS — E781 Pure hyperglyceridemia: Secondary | ICD-10-CM

## 2016-02-12 DIAGNOSIS — K219 Gastro-esophageal reflux disease without esophagitis: Secondary | ICD-10-CM

## 2016-02-12 DIAGNOSIS — I10 Essential (primary) hypertension: Secondary | ICD-10-CM

## 2016-02-21 ENCOUNTER — Other Ambulatory Visit: Payer: Self-pay | Admitting: Family Medicine

## 2016-02-25 ENCOUNTER — Other Ambulatory Visit (INDEPENDENT_AMBULATORY_CARE_PROVIDER_SITE_OTHER): Payer: Self-pay | Admitting: Physician Assistant

## 2016-03-01 ENCOUNTER — Ambulatory Visit (INDEPENDENT_AMBULATORY_CARE_PROVIDER_SITE_OTHER): Payer: Managed Care, Other (non HMO)

## 2016-03-01 ENCOUNTER — Ambulatory Visit (INDEPENDENT_AMBULATORY_CARE_PROVIDER_SITE_OTHER): Payer: Managed Care, Other (non HMO) | Admitting: Orthopaedic Surgery

## 2016-03-01 DIAGNOSIS — M25551 Pain in right hip: Secondary | ICD-10-CM | POA: Diagnosis not present

## 2016-03-01 NOTE — Progress Notes (Signed)
Office Visit Note   Patient: Gabriel [name]           Date of Birth: 09-20-1950           MRN: [id number] Visit Date: 03/01/2016              Requested by: Mosie Lukes, MD [address] [place], [place] [address] PCP: Penni Homans, MD   Assessment & Plan: Visit Diagnoses:  1. Pain of right hip joint     Plan:At this point he actually has significant arthritis also in his right hip. He is scheduled for a left total hip arthroplasty on 03/16/2016. At that surgery we will place an intra-articular injection of a steroid under direct fluoroscopy in his right hip to help temporize things. He is in favor of this as well. He'll keep his regular follow-up appointment 2 weeks after his hip replacement for but no x-rays will be needed  Follow-Up Instructions: Return for 2 weeks post-op.   Orders:  Orders Placed This Encounter  Procedures  . XR Pelvis 1-2 Views   No orders of the defined types were placed in this encounter.     Procedures: No procedures performed   Clinical Data: No additional findings.   Subjective: No chief complaint on file.   The patient comes in today with a complaint of right hip pain. 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 However his right hip is been heard recently and is been hurt in the groin. I commented about his x-rays at his last visit in July the said his right hip actually looked normal on those x-rays. HPI  Review of Systems   Objective: Vital Signs: There were no vitals taken for this visit.  Physical Exam He is alert and oriented 3. Ortho Exam Examination of his left hip that were going to operate on shows continued significant stiffness with interaction rotation of left hip. The right hip moves much better is less stiff but definitely hurts in the groin with internal and external rotation. Specialty Comments:  No specialty comments  available.  Imaging: Xr Pelvis 1-2 Views  Result Date: 03/01/2016 An AP pelvis shows severe and profound arthritis of his left hip. His right hip actually has quite cemented arthritis as well with almost complete loss of joint space, severe sclerotic changes and her trigger osteophytes    PMFS History: Patient Active Problem List   Diagnosis Date Noted  . Skin lesion of left leg 10/07/2015  . Otitis, externa, infective 03/31/2015  . Left hip pain 08/11/2014  . Colon cancer screening 08/11/2014  . Insomnia 01/09/2014  . Hyperglycemia 01/09/2014  . Eczema 01/09/2014  . Enlarged prostate 01/30/2013  . Esophageal stricture 01/30/2013  . Esophageal ulcer 01/30/2013  . Overweight 11/14/2012  . Esophageal reflux 05/21/2012  . Hyperlipidemia 01/09/2012  . Annual physical exam 09/29/2011  . Epicondylitis elbow, medial 04/20/2011  . HTN (hypertension) 03/27/2011  . Achalasia 03/27/2011   Past Medical History:  Diagnosis Date  . Achalasia   . Arthritis   . Colon cancer screening 08/11/2014   Sees [facility], Dr Carlean Purl   . Eczema 01/09/2014  . Esophageal reflux 05/21/2012  . Hyperlipidemia   . Hypertension   . Insomnia 01/09/2014  . Left hip pain 08/11/2014  . Otitis, externa, infective 03/31/2015  . Overweight 11/14/2012  . Skin lesion of left leg 10/07/2015  . Ulcer ([place])  esophageal ulcer hx    Family History  Problem Relation Age of Onset  . Alcohol abuse Mother   . Ovarian cancer Mother   . Diabetes Maternal Grandmother   . Alcohol abuse Father   . Lung cancer Father   . Colon cancer Neg Hx   . Esophageal cancer Neg Hx   . Rectal cancer Neg Hx   . Stomach cancer Neg Hx   . Mental illness Sister   . Heart disease Brother     congenital heart  . Arthritis Sister   . Depression Sister   . Hypertension Sister   . Diverticulosis Sister   . Healthy Sister   . Healthy Sister   . Healthy Sister     Past Surgical History:  Procedure Laterality Date  . COLONOSCOPY    .  ESOPHAGOGASTRODUODENOSCOPY    . HELLER MYOTOMY  1999   help with swallowing  . TONSILLECTOMY AND ADENOIDECTOMY  1963  . UPPER GASTROINTESTINAL ENDOSCOPY     Social History   Occupational History  . 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 Smoking status: Former Smoker    Types: Cigarettes    Quit date: 04/13/1991  . Smokeless tobacco: Never Used     Comment: 1 ppd for 25 years quit 56'  . Alcohol use 4.2 oz/week    7 Glasses of wine per week     Comment: Wine nightly  . Drug use: No  . Sexual activity: Not on file

## 2016-03-03 ENCOUNTER — Other Ambulatory Visit (INDEPENDENT_AMBULATORY_CARE_PROVIDER_SITE_OTHER): Payer: Self-pay | Admitting: Physician Assistant

## 2016-03-08 ENCOUNTER — Encounter (HOSPITAL_COMMUNITY)
Admission: RE | Admit: 2016-03-08 | Discharge: 2016-03-08 | Disposition: A | Discharge status: Home / Self Care | Payer: Managed Care, Other (non HMO) | Source: Ambulatory Visit | Attending: Orthopaedic Surgery | Admitting: Orthopaedic Surgery

## 2016-03-08 ENCOUNTER — Encounter (HOSPITAL_COMMUNITY): Payer: Self-pay

## 2016-03-08 ENCOUNTER — Other Ambulatory Visit: Payer: Self-pay

## 2016-03-08 DIAGNOSIS — Z01818 Encounter for other preprocedural examination: Secondary | ICD-10-CM | POA: Insufficient documentation

## 2016-03-08 DIAGNOSIS — Z01812 Encounter for preprocedural laboratory examination: Secondary | ICD-10-CM | POA: Insufficient documentation

## 2016-03-08 DIAGNOSIS — M1612 Unilateral primary osteoarthritis, left hip: Secondary | ICD-10-CM | POA: Diagnosis not present

## 2016-03-08 LAB — CBC
HCT: 43.5 % (ref 39.0–52.0)
HEMOGLOBIN: 15 g/dL (ref 13.0–17.0)
MCH: 31.4 pg (ref 26.0–34.0)
MCHC: 34.5 g/dL (ref 30.0–36.0)
MCV: 91 fL (ref 78.0–100.0)
PLATELETS: 302 10*3/uL (ref 150–400)
RBC: 4.78 MIL/uL (ref 4.22–5.81)
RDW: 12.4 % (ref 11.5–15.5)
WBC: 6.1 10*3/uL (ref 4.0–10.5)

## 2016-03-08 LAB — BASIC METABOLIC PANEL
ANION GAP: 8 (ref 5–15)
BUN: 9 mg/dL (ref 6–20)
CHLORIDE: 99 mmol/L — AB (ref 101–111)
CO2: 27 mmol/L (ref 22–32)
Calcium: 9.4 mg/dL (ref 8.9–10.3)
Creatinine, Ser: 0.86 mg/dL (ref 0.61–1.24)
GFR calc Af Amer: >60 mL/min (ref >60)
Glucose, Bld: 98 mg/dL (ref 65–99)
POTASSIUM: 4.4 mmol/L (ref 3.5–5.1)
SODIUM: 134 mmol/L — AB (ref 135–145)

## 2016-03-08 LAB — SURGICAL PCR SCREEN
MRSA, PCR: NEGATIVE
Staphylococcus aureus: NEGATIVE

## 2016-03-08 NOTE — Progress Notes (Signed)
Patient has achalasia.  It was acting up back in 2013/14...causing chest pressure & discomfort.  Stress test ordered by Dr. Elizebeth Koller. Test came back normal Never had seen a cardio, has had no other symptoms or problems. PCP is Dr. Gwyneth Revels LOV 09/2015

## 2016-03-08 NOTE — Pre-Procedure Instructions (Signed)
Bezaleel Kempen  03/08/2016      [id number] - [place], [place] - [address] [place] [address] Phone: [phone] Fax: [phone]    Your procedure is scheduled on Tuesday, Dec. 5th   Report to [facility] Admitting at 11:00 AM             (posted surgery time 11:00am - 2:45pm) .  Call this number if you have problems the [place]  [phone]   Remember:  Do not eat food or drink liquids after midnight Monday.   Take these medicines the morning of surgery with A SIP OF WATER : Omeprazole             4-5 days prior to surgery, STOP taking any anti-inflammatories, aspirin, herbal supplements, and vitamins.   Do not wear jewelry - no rings or watches.  Do not wear lotions, powders, colognes or deoderant.              Men may shave face and neck.  Do not bring valuables to the hospital.  [facility] is not responsible for any belongings or valuables.  Contacts, dentures or bridgework may not be worn into surgery.  Leave your suitcase in the car.  After surgery it may be brought to your room.  For patients admitted to the hospital, discharge time will be determined by your treatment team.  Please read over the following fact sheets that you were given. Pain Booklet and MRSA Information

## 2016-03-15 MED ORDER — TRANEXAMIC ACID 1000 MG/10ML IV SOLN
1000.0000 mg | INTRAVENOUS | Status: AC
  Administered 2016-03-16: 1000 mg via INTRAVENOUS
  Filled 2016-03-15: qty 10

## 2016-03-15 MED ORDER — CEFAZOLIN SODIUM-DEXTROSE 2-4 GM/100ML-% IV SOLN
2.0000 g | INTRAVENOUS | Status: AC
  Administered 2016-03-16: 2 g via INTRAVENOUS
  Filled 2016-03-15: qty 100

## 2016-03-16 ENCOUNTER — Inpatient Hospital Stay (HOSPITAL_COMMUNITY): Payer: Managed Care, Other (non HMO) | Admitting: Anesthesiology

## 2016-03-16 ENCOUNTER — Inpatient Hospital Stay (HOSPITAL_COMMUNITY): Payer: Managed Care, Other (non HMO)

## 2016-03-16 ENCOUNTER — Encounter (HOSPITAL_COMMUNITY): Payer: Self-pay | Admitting: *Deleted

## 2016-03-16 ENCOUNTER — Inpatient Hospital Stay (HOSPITAL_COMMUNITY)
Admission: RE | Admit: 2016-03-16 | Discharge: 2016-03-17 | DRG: 470 | Disposition: A | Discharge status: Home Health | Payer: Managed Care, Other (non HMO) | Source: Ambulatory Visit | Attending: Orthopaedic Surgery | Admitting: Orthopaedic Surgery

## 2016-03-16 ENCOUNTER — Encounter (HOSPITAL_COMMUNITY)
Admission: RE | Disposition: A | Discharge status: Home Health | Payer: Self-pay | Source: Ambulatory Visit | Attending: Orthopaedic Surgery

## 2016-03-16 DIAGNOSIS — Z96642 Presence of left artificial hip joint: Secondary | ICD-10-CM

## 2016-03-16 DIAGNOSIS — Z8719 Personal history of other diseases of the digestive system: Secondary | ICD-10-CM

## 2016-03-16 DIAGNOSIS — K219 Gastro-esophageal reflux disease without esophagitis: Secondary | ICD-10-CM | POA: Diagnosis present

## 2016-03-16 DIAGNOSIS — M1612 Unilateral primary osteoarthritis, left hip: Secondary | ICD-10-CM

## 2016-03-16 DIAGNOSIS — E785 Hyperlipidemia, unspecified: Secondary | ICD-10-CM | POA: Diagnosis present

## 2016-03-16 DIAGNOSIS — E663 Overweight: Secondary | ICD-10-CM | POA: Diagnosis present

## 2016-03-16 DIAGNOSIS — L309 Dermatitis, unspecified: Secondary | ICD-10-CM | POA: Diagnosis present

## 2016-03-16 DIAGNOSIS — I10 Essential (primary) hypertension: Secondary | ICD-10-CM | POA: Diagnosis present

## 2016-03-16 DIAGNOSIS — M160 Bilateral primary osteoarthritis of hip: Secondary | ICD-10-CM | POA: Diagnosis present

## 2016-03-16 DIAGNOSIS — Z419 Encounter for procedure for purposes other than remedying health state, unspecified: Secondary | ICD-10-CM

## 2016-03-16 DIAGNOSIS — M25552 Pain in left hip: Secondary | ICD-10-CM | POA: Diagnosis present

## 2016-03-16 DIAGNOSIS — Z87891 Personal history of nicotine dependence: Secondary | ICD-10-CM

## 2016-03-16 DIAGNOSIS — Z6829 Body mass index (BMI) 29.0-29.9, adult: Secondary | ICD-10-CM

## 2016-03-16 HISTORY — PX: TOTAL HIP ARTHROPLASTY: SHX124

## 2016-03-16 SURGERY — ARTHROPLASTY, HIP, TOTAL, ANTERIOR APPROACH
Anesthesia: Spinal | Laterality: Left

## 2016-03-16 MED ORDER — PHENYLEPHRINE HCL 10 MG/ML IJ SOLN
INTRAVENOUS | Status: DC | PRN
  Administered 2016-03-16: 20 ug/min via INTRAVENOUS

## 2016-03-16 MED ORDER — ENALAPRIL MALEATE 5 MG PO TABS
10.0000 mg | ORAL_TABLET | Freq: Every day | ORAL | Status: DC
  Administered 2016-03-16 – 2016-03-17 (×2): 10 mg via ORAL
  Filled 2016-03-16 (×2): qty 2

## 2016-03-16 MED ORDER — METHOCARBAMOL 1000 MG/10ML IJ SOLN
500.0000 mg | Freq: Four times a day (QID) | INTRAVENOUS | Status: DC | PRN
  Filled 2016-03-16: qty 5

## 2016-03-16 MED ORDER — HYDROCHLOROTHIAZIDE 25 MG PO TABS
12.5000 mg | ORAL_TABLET | Freq: Every day | ORAL | Status: DC
  Administered 2016-03-16 – 2016-03-17 (×2): 12.5 mg via ORAL
  Filled 2016-03-16 (×2): qty 1

## 2016-03-16 MED ORDER — LACTATED RINGERS IV SOLN
INTRAVENOUS | Status: DC
  Administered 2016-03-16 (×2): via INTRAVENOUS

## 2016-03-16 MED ORDER — SODIUM CHLORIDE 0.9 % IV SOLN
INTRAVENOUS | Status: DC
  Administered 2016-03-16: 22:00:00 via INTRAVENOUS

## 2016-03-16 MED ORDER — METOCLOPRAMIDE HCL 5 MG PO TABS: 5.0000 mg | ORAL_TABLET | Freq: Three times a day (TID) | ORAL | Status: DC | PRN

## 2016-03-16 MED ORDER — ZOLPIDEM TARTRATE 5 MG PO TABS: 5.0000 mg | ORAL_TABLET | Freq: Every evening | ORAL | Status: DC | PRN

## 2016-03-16 MED ORDER — BUPIVACAINE HCL (PF) 0.25 % IJ SOLN
INTRAMUSCULAR | Status: AC
  Filled 2016-03-16: qty 10

## 2016-03-16 MED ORDER — FENTANYL CITRATE (PF) 100 MCG/2ML IJ SOLN
INTRAMUSCULAR | Status: AC
  Filled 2016-03-16: qty 2

## 2016-03-16 MED ORDER — ASPIRIN 81 MG PO CHEW
81.0000 mg | CHEWABLE_TABLET | Freq: Two times a day (BID) | ORAL | Status: DC
  Administered 2016-03-17: 81 mg via ORAL
  Filled 2016-03-16: qty 1

## 2016-03-16 MED ORDER — BUPIVACAINE IN DEXTROSE 0.75-8.25 % IT SOLN
INTRATHECAL | Status: DC | PRN
  Administered 2016-03-16: 15 mg via INTRATHECAL

## 2016-03-16 MED ORDER — DIPHENHYDRAMINE HCL 12.5 MG/5ML PO ELIX: 12.5000 mg | ORAL_SOLUTION | ORAL | Status: DC | PRN

## 2016-03-16 MED ORDER — HYDROMORPHONE HCL 1 MG/ML IJ SOLN
INTRAMUSCULAR | Status: AC
  Filled 2016-03-16: qty 0.5

## 2016-03-16 MED ORDER — CHLORHEXIDINE GLUCONATE 4 % EX LIQD: 60.0000 mL | Freq: Once | CUTANEOUS | Status: DC

## 2016-03-16 MED ORDER — ONDANSETRON HCL 4 MG/2ML IJ SOLN: 4.0000 mg | Freq: Four times a day (QID) | INTRAMUSCULAR | Status: DC | PRN

## 2016-03-16 MED ORDER — LIDOCAINE HCL (CARDIAC) 20 MG/ML IV SOLN
INTRAVENOUS | Status: DC | PRN
  Administered 2016-03-16: 30 mg via INTRAVENOUS

## 2016-03-16 MED ORDER — PROPOFOL 500 MG/50ML IV EMUL
INTRAVENOUS | Status: DC | PRN
  Administered 2016-03-16: 60 ug/kg/min via INTRAVENOUS

## 2016-03-16 MED ORDER — ALUM & MAG HYDROXIDE-SIMETH 200-200-20 MG/5ML PO SUSP: 30.0000 mL | ORAL | Status: DC | PRN

## 2016-03-16 MED ORDER — MIDAZOLAM HCL 2 MG/2ML IJ SOLN: 0.5000 mg | Freq: Once | INTRAMUSCULAR | Status: DC | PRN

## 2016-03-16 MED ORDER — HYDROMORPHONE HCL 2 MG/ML IJ SOLN
1.0000 mg | INTRAMUSCULAR | Status: DC | PRN
  Administered 2016-03-17: 1 mg via INTRAVENOUS
  Filled 2016-03-16: qty 1

## 2016-03-16 MED ORDER — MIDAZOLAM HCL 2 MG/2ML IJ SOLN
INTRAMUSCULAR | Status: AC
  Filled 2016-03-16: qty 2

## 2016-03-16 MED ORDER — METHOCARBAMOL 500 MG PO TABS: 500.0000 mg | ORAL_TABLET | Freq: Four times a day (QID) | ORAL | Status: DC | PRN

## 2016-03-16 MED ORDER — PROMETHAZINE HCL 25 MG/ML IJ SOLN: 6.2500 mg | INTRAMUSCULAR | Status: DC | PRN

## 2016-03-16 MED ORDER — BUPIVACAINE HCL (PF) 0.25 % IJ SOLN: INTRAMUSCULAR | Status: DC | PRN

## 2016-03-16 MED ORDER — OXYCODONE HCL 5 MG PO TABS
ORAL_TABLET | ORAL | Status: AC
  Filled 2016-03-16: qty 1

## 2016-03-16 MED ORDER — 0.9 % SODIUM CHLORIDE (POUR BTL) OPTIME
TOPICAL | Status: DC | PRN
  Administered 2016-03-16: 1000 mL

## 2016-03-16 MED ORDER — PROPOFOL 10 MG/ML IV BOLUS
INTRAVENOUS | Status: AC
  Filled 2016-03-16: qty 20

## 2016-03-16 MED ORDER — KETOROLAC TROMETHAMINE 15 MG/ML IJ SOLN
7.5000 mg | Freq: Four times a day (QID) | INTRAMUSCULAR | Status: AC
  Administered 2016-03-16 – 2016-03-17 (×4): 7.5 mg via INTRAVENOUS
  Filled 2016-03-16 (×4): qty 1

## 2016-03-16 MED ORDER — FENTANYL CITRATE (PF) 100 MCG/2ML IJ SOLN
INTRAMUSCULAR | Status: DC | PRN
  Administered 2016-03-16: 100 ug via INTRAVENOUS

## 2016-03-16 MED ORDER — CEFAZOLIN IN D5W 1 GM/50ML IV SOLN
1.0000 g | Freq: Four times a day (QID) | INTRAVENOUS | Status: AC
  Administered 2016-03-16 – 2016-03-17 (×2): 1 g via INTRAVENOUS
  Filled 2016-03-16 (×4): qty 50

## 2016-03-16 MED ORDER — SODIUM CHLORIDE 0.9 % IR SOLN
Status: DC | PRN
  Administered 2016-03-16: 3000 mL

## 2016-03-16 MED ORDER — METOCLOPRAMIDE HCL 5 MG/ML IJ SOLN: 5.0000 mg | Freq: Three times a day (TID) | INTRAMUSCULAR | Status: DC | PRN

## 2016-03-16 MED ORDER — ACETAMINOPHEN 650 MG RE SUPP: 650.0000 mg | Freq: Four times a day (QID) | RECTAL | Status: DC | PRN

## 2016-03-16 MED ORDER — PHENOL 1.4 % MT LIQD: 1.0000 | OROMUCOSAL | Status: DC | PRN

## 2016-03-16 MED ORDER — DOCUSATE SODIUM 100 MG PO CAPS
100.0000 mg | ORAL_CAPSULE | Freq: Two times a day (BID) | ORAL | Status: DC
  Administered 2016-03-16 – 2016-03-17 (×2): 100 mg via ORAL
  Filled 2016-03-16 (×2): qty 1

## 2016-03-16 MED ORDER — MIDAZOLAM HCL 5 MG/5ML IJ SOLN
INTRAMUSCULAR | Status: DC | PRN
  Administered 2016-03-16: 2 mg via INTRAVENOUS

## 2016-03-16 MED ORDER — ACETAMINOPHEN 325 MG PO TABS
650.0000 mg | ORAL_TABLET | Freq: Four times a day (QID) | ORAL | Status: DC | PRN
  Administered 2016-03-17: 650 mg via ORAL
  Filled 2016-03-16: qty 2

## 2016-03-16 MED ORDER — METHYLPREDNISOLONE ACETATE 40 MG/ML IJ SUSP
INTRAMUSCULAR | Status: DC | PRN
  Administered 2016-03-16: 14:00:00 via INTRA_ARTICULAR

## 2016-03-16 MED ORDER — MENTHOL 3 MG MT LOZG: 1.0000 | LOZENGE | OROMUCOSAL | Status: DC | PRN

## 2016-03-16 MED ORDER — OXYCODONE HCL 5 MG PO TABS
5.0000 mg | ORAL_TABLET | ORAL | Status: DC | PRN
  Administered 2016-03-16: 10 mg via ORAL
  Administered 2016-03-16: 5 mg via ORAL
  Administered 2016-03-16 – 2016-03-17 (×4): 10 mg via ORAL
  Filled 2016-03-16 (×5): qty 2

## 2016-03-16 MED ORDER — ONDANSETRON HCL 4 MG PO TABS: 4.0000 mg | ORAL_TABLET | Freq: Four times a day (QID) | ORAL | Status: DC | PRN

## 2016-03-16 MED ORDER — METHYLPREDNISOLONE ACETATE 40 MG/ML IJ SUSP
INTRAMUSCULAR | Status: AC
  Filled 2016-03-16: qty 1

## 2016-03-16 MED ORDER — MEPERIDINE HCL 25 MG/ML IJ SOLN: 6.2500 mg | INTRAMUSCULAR | Status: DC | PRN

## 2016-03-16 MED ORDER — PANTOPRAZOLE SODIUM 40 MG PO TBEC
80.0000 mg | DELAYED_RELEASE_TABLET | Freq: Every day | ORAL | Status: DC
  Administered 2016-03-17: 80 mg via ORAL
  Filled 2016-03-16: qty 2

## 2016-03-16 MED ORDER — HYDROMORPHONE HCL 1 MG/ML IJ SOLN
0.2500 mg | INTRAMUSCULAR | Status: DC | PRN
  Administered 2016-03-16 (×2): 0.5 mg via INTRAVENOUS

## 2016-03-16 SURGICAL SUPPLY — 50 items
BENZOIN TINCTURE PRP APPL 2/3 (GAUZE/BANDAGES/DRESSINGS) ×2 IMPLANT
BLADE SAW SGTL 18X1.27X75 (BLADE) ×2 IMPLANT
BLADE SURG ROTATE 9660 (MISCELLANEOUS) IMPLANT
CAPT HIP TOTAL 2 ×2 IMPLANT
CELLS DAT CNTRL 66122 CELL SVR (MISCELLANEOUS) ×1 IMPLANT
CLSR STERI-STRIP ANTIMIC 1/2X4 (GAUZE/BANDAGES/DRESSINGS) ×2 IMPLANT
COVER SURGICAL LIGHT HANDLE (MISCELLANEOUS) ×2 IMPLANT
DRAPE C-ARM 42X72 X-RAY (DRAPES) ×2 IMPLANT
DRAPE STERI IOBAN 125X83 (DRAPES) ×2 IMPLANT
DRAPE U-SHAPE 47X51 STRL (DRAPES) ×6 IMPLANT
DRSG AQUACEL AG ADV 3.5X10 (GAUZE/BANDAGES/DRESSINGS) ×2 IMPLANT
DURAPREP 26ML APPLICATOR (WOUND CARE) ×2 IMPLANT
ELECT BLADE 4.0 EZ CLEAN MEGAD (MISCELLANEOUS) ×2
ELECT BLADE 6.5 EXT (BLADE) IMPLANT
ELECT REM PT RETURN 9FT ADLT (ELECTROSURGICAL) ×2
ELECTRODE BLDE 4.0 EZ CLN MEGD (MISCELLANEOUS) ×1 IMPLANT
ELECTRODE REM PT RTRN 9FT ADLT (ELECTROSURGICAL) ×1 IMPLANT
FACESHIELD WRAPAROUND (MASK) ×4 IMPLANT
GLOVE BIOGEL PI IND STRL 8 (GLOVE) ×2 IMPLANT
GLOVE BIOGEL PI INDICATOR 8 (GLOVE) ×2
GLOVE ECLIPSE 8.0 STRL XLNG CF (GLOVE) ×2 IMPLANT
GLOVE ORTHO TXT STRL SZ7.5 (GLOVE) ×4 IMPLANT
GOWN STRL REUS W/ TWL LRG LVL3 (GOWN DISPOSABLE) ×2 IMPLANT
GOWN STRL REUS W/ TWL XL LVL3 (GOWN DISPOSABLE) ×2 IMPLANT
GOWN STRL REUS W/TWL LRG LVL3 (GOWN DISPOSABLE) ×2
GOWN STRL REUS W/TWL XL LVL3 (GOWN DISPOSABLE) ×2
HANDPIECE INTERPULSE COAX TIP (DISPOSABLE) ×1
KIT BASIN OR (CUSTOM PROCEDURE TRAY) ×2 IMPLANT
KIT ROOM TURNOVER OR (KITS) ×2 IMPLANT
MANIFOLD NEPTUNE II (INSTRUMENTS) ×2 IMPLANT
NS IRRIG 1000ML POUR BTL (IV SOLUTION) ×2 IMPLANT
PACK TOTAL JOINT (CUSTOM PROCEDURE TRAY) ×2 IMPLANT
PAD ARMBOARD 7.5X6 YLW CONV (MISCELLANEOUS) ×2 IMPLANT
RTRCTR WOUND ALEXIS 18CM MED (MISCELLANEOUS) ×2
SET HNDPC FAN SPRY TIP SCT (DISPOSABLE) ×1 IMPLANT
STAPLER VISISTAT 35W (STAPLE) IMPLANT
STRIP CLOSURE SKIN 1/2X4 (GAUZE/BANDAGES/DRESSINGS) ×4 IMPLANT
SUT ETHIBOND NAB CT1 #1 30IN (SUTURE) ×2 IMPLANT
SUT MNCRL AB 4-0 PS2 18 (SUTURE) IMPLANT
SUT VIC AB 0 CT1 27 (SUTURE) ×1
SUT VIC AB 0 CT1 27XBRD ANBCTR (SUTURE) ×1 IMPLANT
SUT VIC AB 1 CT1 27 (SUTURE) ×1
SUT VIC AB 1 CT1 27XBRD ANBCTR (SUTURE) ×1 IMPLANT
SUT VIC AB 2-0 CT1 27 (SUTURE) ×1
SUT VIC AB 2-0 CT1 TAPERPNT 27 (SUTURE) ×1 IMPLANT
TOWEL OR 17X24 6PK STRL BLUE (TOWEL DISPOSABLE) ×2 IMPLANT
TOWEL OR 17X26 10 PK STRL BLUE (TOWEL DISPOSABLE) ×2 IMPLANT
TRAY CATH 16FR W/PLASTIC CATH (SET/KITS/TRAYS/PACK) IMPLANT
TRAY FOLEY CATH 16FRSI W/METER (SET/KITS/TRAYS/PACK) ×2 IMPLANT
WATER STERILE IRR 1000ML POUR (IV SOLUTION) IMPLANT

## 2016-03-16 NOTE — Anesthesia Postprocedure Evaluation (Signed)
Anesthesia Post Note  Patient: Gabriel [name]  Procedure(s) Performed: Procedure(s) (LRB): LEFT TOTAL HIP ARTHROPLASTY ANTERIOR APPROACH (Left)  Patient location during evaluation: PACU Anesthesia Type: Spinal Level of consciousness: awake and alert, oriented and patient cooperative Pain management: pain level controlled Vital Signs Assessment: post-procedure vital signs reviewed and stable Respiratory status: spontaneous breathing, nonlabored ventilation and respiratory function stable Cardiovascular status: blood pressure returned to baseline and stable Postop Assessment: spinal receding, patient able to bend at knees and no signs of nausea or vomiting Anesthetic complications: no    Last Vitals:  Vitals:   03/16/16 1623 03/16/16 1630  BP: 120/79   Pulse: 70 64  Resp: 13 11  Temp:      Last Pain:  Vitals:   03/16/16 1708  TempSrc:   PainSc: Asleep    LLE Motor Response: No movement due to regional block (03/16/16 1708)   RLE Motor Response: No movement due to regional block (03/16/16 1708)   L Sensory Level: S1-Sole of foot, small toes (03/16/16 1708) R Sensory Level: S1-Sole of foot, small toes (03/16/16 1708)  Mavis Fichera,E. Adia Crammer

## 2016-03-16 NOTE — Anesthesia Preprocedure Evaluation (Addendum)
Anesthesia Evaluation  Patient identified by MRN, date of birth, ID band Patient awake    Reviewed: Allergy & Precautions, H&P , NPO status , Patient's Chart, lab work & pertinent test results  History of Anesthesia Complications Negative for: history of anesthetic complications  Airway Mallampati: II  TM Distance: >3 FB Neck ROM: Full    Dental  (+) Teeth Intact, Dental Advidsory Given, Chipped   Pulmonary former smoker,    breath sounds clear to auscultation       Cardiovascular hypertension, Pt. on medications (-) angina Rhythm:Regular Rate:Normal  '13 stress test normal   Neuro/Psych negative neurological ROS     GI/Hepatic Neg liver ROS, PUD, GERD  Medicated and Controlled,  Endo/Other  negative endocrine ROS  Renal/GU negative Renal ROS     Musculoskeletal  (+) Arthritis , Osteoarthritis,    Abdominal   Peds  Hematology negative hematology ROS (+)   Anesthesia Other Findings   Reproductive/Obstetrics                           Anesthesia Physical Anesthesia Plan  ASA: II  Anesthesia Plan: Spinal   Post-op Pain Management:    Induction:   Airway Management Planned: Natural Airway and Simple Face Mask  Additional Equipment:   Intra-op Plan:   Post-operative Plan:   Informed Consent: I have reviewed the patients History and Physical, chart, labs and discussed the procedure including the risks, benefits and alternatives for the proposed anesthesia with the patient or authorized representative who has indicated his/her understanding and acceptance.   Dental Advisory Given  Plan Discussed with: CRNA and Surgeon  Anesthesia Plan Comments: (Plan routine monitors, SAB)       Anesthesia Quick Evaluation

## 2016-03-16 NOTE — Anesthesia Procedure Notes (Signed)
Spinal  Patient location during procedure: OR End time: 03/16/2016 1:25 PM Staffing Anesthesiologist: Annye Asa Performed: anesthesiologist  Preanesthetic Checklist Completed: patient identified, site marked, surgical consent, pre-op evaluation, timeout performed, IV checked, risks and benefits discussed and monitors and equipment checked Spinal Block Patient position: sitting Prep: ChloraPrep and site prepped and draped Patient monitoring: blood pressure, continuous pulse ox, cardiac monitor and heart rate Approach: midline Location: L3-4 Injection technique: single-shot Needle Needle type: Quincke  Needle gauge: 25 G Needle length: 9 cm Additional Notes Pt identified in Operating room.  Monitors applied. Working IV access confirmed. Sterile prep, drape lumbar spine.  1% lido local L 3,4.  #25ga Quincke into clear CSF L 3,4.  15mg  0.75% Bupivacaine with dextrose injected with asp CSF beginning and end of injection.  Patient asymptomatic, VSS, no heme aspirated, tolerated well.  Jenita Seashore, MD

## 2016-03-16 NOTE — H&P (Signed)
TOTAL HIP ADMISSION H&P  Patient is admitted for left total hip arthroplasty.  Subjective:  Chief Complaint: left hip pain  HPI: Gabriel [name], 65 y.o. male, has a history of pain and functional disability in the left hip(s) due to arthritis and patient has failed non-surgical conservative treatments for greater than 12 weeks to include NSAID's and/or analgesics, corticosteriod injections, flexibility and strengthening excercises and activity modification.  Onset of symptoms was gradual starting 3 years ago with gradually worsening course since that time.The patient noted no past surgery on the left hip(s).  Patient currently rates pain in the left hip at 9 out of 10 with activity. Patient has night pain, worsening of pain with activity and weight bearing, pain that interfers with activities of daily living and pain with passive range of motion. Patient has evidence of subchondral cysts, subchondral sclerosis, periarticular osteophytes and joint space narrowing by imaging studies. This condition presents safety issues increasing the risk of falls.  There is no current active infection.  Patient Active Problem List   Diagnosis Date Noted  . Unilateral primary osteoarthritis, left hip 03/16/2016  . Skin lesion of left leg 10/07/2015  . Otitis, externa, infective 03/31/2015  . Left hip pain 08/11/2014  . Colon cancer screening 08/11/2014  . Insomnia 01/09/2014  . Hyperglycemia 01/09/2014  . Eczema 01/09/2014  . Enlarged prostate 01/30/2013  . Esophageal stricture 01/30/2013  . Esophageal ulcer 01/30/2013  . Overweight 11/14/2012  . Esophageal reflux 05/21/2012  . Hyperlipidemia 01/09/2012  . Annual physical exam 09/29/2011  . Epicondylitis elbow, medial 04/20/2011  . HTN (hypertension) 03/27/2011  . Achalasia 03/27/2011   Past Medical History:  Diagnosis Date  . Achalasia   . Arthritis   . Colon cancer screening 08/11/2014   Sees [facility], Dr Carlean Purl   . Eczema 01/09/2014  . Esophageal  reflux 05/21/2012  . Hyperlipidemia   . Hypertension   . Insomnia 01/09/2014  . Left hip pain 08/11/2014  . Otitis, externa, infective 03/31/2015  . Overweight 11/14/2012  . Skin lesion of left leg 10/07/2015  . Ulcer ([place])    esophageal ulcer hx    Past Surgical History:  Procedure Laterality Date  . COLONOSCOPY    . ESOPHAGOGASTRODUODENOSCOPY    . HELLER MYOTOMY  1999   help with swallowing  . TONSILLECTOMY    . TONSILLECTOMY AND ADENOIDECTOMY  1963  . UPPER GASTROINTESTINAL ENDOSCOPY      Prescriptions Prior to Admission  Medication Sig Dispense Refill Last Dose  . enalapril (VASOTEC) 10 MG tablet Take 1 tablet (10 mg total) by mouth daily. 90 tablet 1 03/15/2016 at Unknown time  . etodolac (LODINE) 500 MG tablet TAKE 1 TABLET BY MOUTH TWICE A DAY 60 tablet 5 Past Week at Unknown time  . hydrochlorothiazide (HYDRODIURIL) 12.5 MG tablet TAKE 1 TABLET (12.5 MG TOTAL) BY MOUTH DAILY. 30 tablet 6 03/15/2016 at Unknown time  . hyoscyamine (LEVSIN SL) 0.125 MG SL tablet PLACE 1 TABLET (0.125 MG TOTAL) UNDER THE TONGUE EVERY 4 (FOUR) HOURS AS NEEDED. 30 tablet 1 Past Week at Unknown time  . Multiple Vitamins-Minerals (ANTIOXIDANT VITAMINS) TABS Take 1 tablet by mouth daily. Life Guard   Past Week at Unknown time  . omeprazole (PRILOSEC) 40 MG capsule Take 1 capsule (40 mg total) by mouth daily. 90 capsule 2 03/16/2016 at 0730  . pimecrolimus (ELIDEL) 1 % cream Apply 1 application topically daily. (Patient taking differently: Apply 1 application topically daily as needed. ) 60 g 5 Past Week at  Unknown time  . Red Yeast Rice Extract 600 MG TABS Take 1 tablet by mouth daily.    Past Week at Unknown time  . mupirocin ointment (BACTROBAN) 2 % Apply to b/l nares qhs x 7 days then as needed 22 g 1 More than a month at Unknown time   Allergies  Allergen Reactions  . No Known Allergies     Social History  Substance Use Topics  . Smoking status: Former Smoker    Types: Cigarettes    Quit date:  04/13/1991  . Smokeless tobacco: Never Used     Comment: 1 ppd for 25 years quit 24'  . Alcohol use 4.2 oz/week    7 Glasses of wine per week     Comment: Wine nightly    Family History  Problem Relation Age of Onset  . Alcohol abuse Mother   . Ovarian cancer Mother   . Diabetes Maternal Grandmother   . Alcohol abuse Father   . Lung cancer Father   . Mental illness Sister   . Depression Sister   . Heart disease Brother     congenital heart  . Arthritis Sister   . Hypertension Sister   . Diverticulosis Sister   . Healthy Sister   . Healthy Sister   . Healthy Sister   . Colon cancer Neg Hx   . Esophageal cancer Neg Hx   . Rectal cancer Neg Hx   . Stomach cancer Neg Hx      Review of Systems  Musculoskeletal: Positive for joint pain.  All other systems reviewed and are negative.   Objective:  Physical Exam  Constitutional: He is oriented to person, place, and time. He appears well-developed and well-nourished.  HENT:  Head: Normocephalic and atraumatic.  Eyes: EOM are normal. Pupils are equal, round, and reactive to light.  Neck: Normal range of motion. Neck supple.  Cardiovascular: Normal rate and regular rhythm.   Respiratory: Effort normal and breath sounds normal.  GI: Soft. Bowel sounds are normal.  Musculoskeletal:       Right hip: He exhibits decreased range of motion, decreased strength and bony tenderness.       Left hip: He exhibits decreased range of motion, decreased strength, tenderness and bony tenderness.  Neurological: He is alert and oriented to person, place, and time.  Skin: Skin is warm and dry.  Psychiatric: He has a normal mood and affect.    Vital signs in last 24 hours: Temp:  [98.8 F (37.1 C)] 98.8 F (37.1 C) (12/05 1105) Pulse Rate:  [98] 98 (12/05 1107) Resp:  [18] 18 (12/05 1106) BP: (157)/(91) 157/91 (12/05 1107) SpO2:  [99 %] 99 % (12/05 1106) Weight:  [182 lb (82.6 kg)] 182 lb (82.6 kg) (12/05 1105)  Labs:   Estimated body  mass index is 29.38 kg/m as calculated from the following:   Height as of this encounter: 5\' 6"  (1.676 m).   Weight as of this encounter: 182 lb (82.6 kg).   Imaging Review Plain radiographs demonstrate severe degenerative joint disease of the left hip(s). The bone quality appears to be excellent for age and reported activity level.  Assessment/Plan:  End stage arthritis, left hip(s), right hip pain  The patient history, physical examination, clinical judgement of the provider and imaging studies are consistent with end stage degenerative joint disease of the left hip(s) and total hip arthroplasty is deemed medically necessary. The treatment options including medical management, injection therapy, arthroscopy and arthroplasty were discussed at  length. The risks and benefits of total hip arthroplasty were presented and reviewed. The risks due to aseptic loosening, infection, stiffness, dislocation/subluxation,  thromboembolic complications and other imponderables were discussed.  The patient acknowledged the explanation, agreed to proceed with the plan and consent was signed. Patient is being admitted for inpatient treatment for surgery, pain control, PT, OT, prophylactic antibiotics, VTE prophylaxis, progressive ambulation and ADL's and discharge planning.The patient is planning to be discharged home with home health services.  We also plan to place a steroid injection in his right hip under direct fluoroscopy due to hip pain and consent is obtained for this as well.

## 2016-03-16 NOTE — Anesthesia Procedure Notes (Signed)
Procedure Name: MAC Date/Time: 03/16/2016 1:14 PM Performed by: Neldon Newport Pre-anesthesia Checklist: Timeout performed, Patient being monitored, Suction available, Emergency Drugs available and Patient identified Patient Re-evaluated:Patient Re-evaluated prior to inductionOxygen Delivery Method: Simple face mask Placement Confirmation: breath sounds checked- equal and bilateral and positive ETCO2

## 2016-03-16 NOTE — Transfer of Care (Signed)
Immediate Anesthesia Transfer of Care Note  Patient: Gabriel [name]  Procedure(s) Performed: Procedure(s): LEFT TOTAL HIP ARTHROPLASTY ANTERIOR APPROACH (Left)  Patient Location: PACU  Anesthesia Type:MAC and Spinal  Level of Consciousness: awake, alert  and oriented  Airway & Oxygen Therapy: Patient Spontanous Breathing  Post-op Assessment: Report given to RN and Post -op Vital signs reviewed and stable  Post vital signs: Reviewed and stable  Last Vitals:  Vitals:   03/16/16 1106 03/16/16 1107  BP:  (!) 157/91  Pulse:  98  Resp: 18   Temp:     HR 83, RR 14, Sats 100% BP 103/64 Last Pain:  Vitals:   03/16/16 1105  TempSrc: Oral      Patients Stated Pain Goal: 6 ([id number])  Complications: No apparent anesthesia complications

## 2016-03-16 NOTE — Brief Op Note (Signed)
03/16/2016  2:48 PM  PATIENT:  Gabriel [name]  65 y.o. male  PRE-OPERATIVE DIAGNOSIS:  severe left hip end-stage arthritis  POST-OPERATIVE DIAGNOSIS:  severe left hip end-stage arthritis  PROCEDURE:  Procedure(s): LEFT TOTAL HIP ARTHROPLASTY ANTERIOR APPROACH (Left)  SURGEON:  Surgeon(s) and Role: Panel 1:    * Mcarthur Rossetti, MD - Primary  Panel 2:    * Mcarthur Rossetti, MD - Primary  PHYSICIAN ASSISTANT: Benita Stabile, PA-C  ANESTHESIA:   spinal  EBL:  Total I/O In: [id number] [I.V.:1150; IV Piggyback:110] Out: 400 [Urine:200; Blood:200]   COUNTS:  YES  DICTATION: .Other Dictation: Dictation Number [id number]  PLAN OF CARE: Admit to inpatient   PATIENT DISPOSITION:  PACU - hemodynamically stable.   Delay start of Pharmacological VTE agent (>24hrs) due to surgical blood loss or risk of bleeding: no

## 2016-03-17 ENCOUNTER — Encounter (HOSPITAL_COMMUNITY): Payer: Self-pay | Admitting: Orthopaedic Surgery

## 2016-03-17 LAB — CBC
HCT: 37.7 % — ABNORMAL LOW (ref 39.0–52.0)
HEMOGLOBIN: 12.4 g/dL — AB (ref 13.0–17.0)
MCH: 30.5 pg (ref 26.0–34.0)
MCHC: 32.9 g/dL (ref 30.0–36.0)
MCV: 92.9 fL (ref 78.0–100.0)
Platelets: 246 10*3/uL (ref 150–400)
RBC: 4.06 MIL/uL — ABNORMAL LOW (ref 4.22–5.81)
RDW: 12.6 % (ref 11.5–15.5)
WBC: 10.1 10*3/uL (ref 4.0–10.5)

## 2016-03-17 LAB — BASIC METABOLIC PANEL
Anion gap: 9 (ref 5–15)
BUN: 10 mg/dL (ref 6–20)
CALCIUM: 8.2 mg/dL — AB (ref 8.9–10.3)
CHLORIDE: 96 mmol/L — AB (ref 101–111)
CO2: 26 mmol/L (ref 22–32)
CREATININE: 0.82 mg/dL (ref 0.61–1.24)
GFR calc Af Amer: >60 mL/min (ref >60)
GFR calc non Af Amer: >60 mL/min (ref >60)
Glucose, Bld: 136 mg/dL — ABNORMAL HIGH (ref 65–99)
Potassium: 3.8 mmol/L (ref 3.5–5.1)
SODIUM: 131 mmol/L — AB (ref 135–145)

## 2016-03-17 MED ORDER — OXYCODONE-ACETAMINOPHEN 5-325 MG PO TABS: 1.0000 | ORAL_TABLET | ORAL | 0 refills | Status: DC | PRN

## 2016-03-17 MED ORDER — ASPIRIN 81 MG PO CHEW: 81.0000 mg | CHEWABLE_TABLET | Freq: Two times a day (BID) | ORAL | 0 refills | Status: DC

## 2016-03-17 MED ORDER — INFLUENZA VAC SPLIT QUAD 0.5 ML IM SUSY
0.5000 mL | PREFILLED_SYRINGE | INTRAMUSCULAR | Status: AC
  Administered 2016-03-17: 0.5 mL via INTRAMUSCULAR
  Filled 2016-03-17 (×2): qty 0.5

## 2016-03-17 MED ORDER — METHOCARBAMOL 500 MG PO TABS: 500.0000 mg | ORAL_TABLET | Freq: Four times a day (QID) | ORAL | 0 refills | Status: DC | PRN

## 2016-03-17 NOTE — Progress Notes (Signed)
Physical Therapy Treatment Patient Details Name: Damir Feezor MRN: [id number] DOB: 1951-02-02 Today's Date: 03/17/2016    History of Present Illness Admitted for LTHA, Direct Ant, WBAT    PT Comments    Overall progressing quite well; stair training complete; questions answered; OK for dc home from PT standpoint   Follow Up Recommendations  Home health PT;Supervision/Assistance - 24 hour     Equipment Recommendations  Rolling walker with 5" wheels;3in1 (PT) (shower chair)    Recommendations for Other Services       Precautions / Restrictions Precautions Precautions: None Restrictions LLE Weight Bearing: Weight bearing as tolerated    Mobility  Bed Mobility Overal bed mobility: Needs Assistance Bed Mobility: Rolling;Sidelying to Sit;Sit to Sidelying Rolling: Supervision Sidelying to sit: Supervision     Sit to sidelying: Supervision General bed mobility comments: Cues for technqiue  Transfers Overall transfer level: Needs assistance Equipment used: Rolling walker (2 wheeled) Transfers: Sit to/from Stand Sit to Stand: Supervision         General transfer comment: Cues for hand placement and safety  Ambulation/Gait Ambulation/Gait assistance: Supervision Ambulation Distance (Feet): 200 Feet Assistive device: Rolling walker (2 wheeled) Gait Pattern/deviations: Step-through pattern     General Gait Details: Cues to self-monitor for activity tolerance   Stairs Stairs: Yes Stairs assistance: Supervision Stair Management: No rails;Forwards;With walker;Step to pattern Number of Stairs: 1 General stair comments: cues for sequence  Wheelchair Mobility    Modified Rankin (Stroke Patients Only)       Balance                                    Cognition Arousal/Alertness: Awake/alert Behavior During Therapy: WFL for tasks assessed/performed Overall Cognitive Status: Within Functional Limits for tasks assessed                       Exercises Total Joint Exercises Ankle Circles/Pumps: AROM;Both;10 reps Quad Sets: AROM;Both;10 reps Gluteal Sets: AROM;Both;10 reps Towel Squeeze: AROM;Both;10 reps Heel Slides: AROM;Left;10 reps Hip ABduction/ADduction: AROM;Left;10 reps Bridges: AROM;Both;15 reps    General Comments        Pertinent Vitals/Pain Pain Assessment: 0-10 Pain Score: 5  Pain Location: L hip Pain Descriptors / Indicators: Aching;Tightness Pain Intervention(s): Monitored during session    Home Living                      Prior Function            PT Goals (current goals can now be found in the care plan section) Acute Rehab PT Goals Patient Stated Goal: back to golf PT Goal Formulation: With patient Time For Goal Achievement: 03/24/16 Potential to Achieve Goals: Good Progress towards PT goals: Progressing toward goals    Frequency    7X/week      PT Plan Current plan remains appropriate    Co-evaluation             End of Session Equipment Utilized During Treatment: Gait belt Activity Tolerance: Patient tolerated treatment well Patient left: in chair;with call bell/phone within reach;with family/visitor present     Time: [id number] PT Time Calculation (min) (ACUTE ONLY): 23 min  Charges:  $Gait Training: 8-22 mins $Therapeutic Exercise: 8-22 mins                    G Codes:      Colletta Maryland  03/17/2016, 4:21 PM  Roney Marion, PT  Acute Rehabilitation Services Pager [phone] Office [phone]

## 2016-03-17 NOTE — Progress Notes (Signed)
Subjective: 1 Day Post-Op Procedure(s) (LRB): LEFT TOTAL HIP ARTHROPLASTY ANTERIOR APPROACH (Left) Patient reports pain as moderate.    Objective: Vital signs in last 24 hours: Temp:  [97.5 F (36.4 C)-100.4 F (38 C)] 99.8 F (37.7 C) (12/06 0500) Pulse Rate:  [64-102] 97 (12/06 0500) Resp:  [10-18] 16 (12/06 0500) BP: (103-164)/(60-91) 120/61 (12/06 0500) SpO2:  [95 %-100 %] 96 % (12/06 0500) Weight:  [182 lb (82.6 kg)] 182 lb (82.6 kg) (12/05 1105)  Intake/Output from previous day: 12/05 0701 - 12/06 0700 In: 1900 [I.V.:1740; IV Piggyback:160] Out: 1320 [Urine:1120; Blood:200] Intake/Output this shift: No intake/output data recorded.   Recent Labs  03/17/16 0447  HGB 12.4*    Recent Labs  03/17/16 0447  WBC 10.1  RBC 4.06*  HCT 37.7*  PLT 246    Recent Labs  03/17/16 0447  NA 131*  K 3.8  CL 96*  CO2 26  BUN 10  CREATININE 0.82  GLUCOSE 136*  CALCIUM 8.2*   No results for input(s): LABPT, INR in the last 72 hours.  Sensation intact distally Intact pulses distally Dorsiflexion/Plantar flexion intact Incision: dressing C/D/I  Assessment/Plan: 1 Day Post-Op Procedure(s) (LRB): LEFT TOTAL HIP ARTHROPLASTY ANTERIOR APPROACH (Left) Up with therapy  Mcarthur Rossetti 03/17/2016, 7:30 AM

## 2016-03-17 NOTE — Evaluation (Signed)
Physical Therapy Evaluation Patient Details Name: Gabriel [name] MRN: [id number] DOB: May 07, 1950 Today's Date: 03/17/2016   History of Present Illness  Admitted for LTHA, Direct Ant, WBAT  Clinical Impression  Pt is s/p THA resulting in the deficits listed below (see PT Problem List). Overall moving quite well; initially painful with moving, but that improved as [name] moved more; I anticipate he will be able to dc home today;  Pt will benefit from skilled PT to increase their independence and safety with mobility to allow discharge to the venue listed below.      Follow Up Recommendations Home health PT;Supervision/Assistance - 24 hour    Equipment Recommendations  Rolling walker with 5" wheels;3in1 (PT) (shower chair)    Recommendations for Other Services       Precautions / Restrictions Precautions Precautions: None Restrictions Weight Bearing Restrictions: Yes LLE Weight Bearing: Weight bearing as tolerated      Mobility  Bed Mobility Overal bed mobility: Needs Assistance Bed Mobility: Supine to Sit     Supine to sit: Min guard     General bed mobility comments: Cues for technqiue  Transfers Overall transfer level: Needs assistance Equipment used: Rolling walker (2 wheeled) Transfers: Sit to/from Stand Sit to Stand: Min guard         General transfer comment: Cues for hand placement and safety  Ambulation/Gait Ambulation/Gait assistance: Min guard;Supervision Ambulation Distance (Feet): 300 Feet Assistive device: Rolling walker (2 wheeled) Gait Pattern/deviations: Step-through pattern     General Gait Details: [place]ard initially then progressing to supervision; cues for gait sequence initially, but noting able to smooth our gait pattern with incr distance  Stairs            Wheelchair Mobility    Modified Rankin (Stroke Patients Only)       Balance                                             Pertinent  Vitals/Pain Pain Assessment: Faces Pain Score: 1  Faces Pain Scale: Hurts even more Pain Location: L hip Pain Descriptors / Indicators: Aching;Grimacing Pain Intervention(s): Monitored during session;Premedicated before session    Home Living Family/patient expects to be discharged to:: Private residence Living Arrangements: Spouse/significant other Available Help at Discharge: Family;Available 24 hours/day Type of Home: House Home Access: Stairs to enter Entrance Stairs-Rails: None Entrance Stairs-Number of Steps: 1 Home Layout: One level Home Equipment: None      Prior Function Level of Independence: Independent               Hand Dominance        Extremity/Trunk Assessment   Upper Extremity Assessment: Overall WFL for tasks assessed           Lower Extremity Assessment: LLE deficits/detail   LLE Deficits / Details: Noting grimace with AAROM of hip; reports feeling tight; noting grossly decr AROM and strength postop     Communication   Communication: No difficulties  Cognition Arousal/Alertness: Awake/alert Behavior During Therapy: WFL for tasks assessed/performed Overall Cognitive Status: Within Functional Limits for tasks assessed                      General Comments      Exercises Total Joint Exercises Ankle Circles/Pumps: AROM;Both;10 reps Quad Sets: AROM;Both;10 reps Gluteal Sets: AROM;Both;10 reps Heel Slides: AAROM;Left;5 reps Hip ABduction/ADduction: AAROM;Left (  2 reps)   Assessment/Plan    PT Assessment Patient needs continued PT services  PT Problem List Decreased strength;Decreased range of motion;Decreased activity tolerance;Decreased mobility;Decreased knowledge of use of DME;Pain          PT Treatment Interventions DME instruction;Gait training;Stair training;Functional mobility training;Therapeutic activities;Therapeutic exercise;Patient/family education    PT Goals (Current goals can be found in the Care Plan  section)  Acute Rehab PT Goals Patient Stated Goal: back to golf PT Goal Formulation: With patient Time For Goal Achievement: 03/24/16 Potential to Achieve Goals: Good    Frequency 7X/week   Barriers to discharge        Co-evaluation               End of Session Equipment Utilized During Treatment: Gait belt Activity Tolerance: Patient tolerated treatment well Patient left: in chair;with call bell/phone within reach;with family/visitor present Nurse Communication: Mobility status         Time: [id number] PT Time Calculation (min) (ACUTE ONLY): 37 min   Charges:   PT Evaluation $PT Eval Low Complexity: 1 Procedure PT Treatments $Gait Training: 8-22 mins   PT G CodesColletta Maryland 03/17/2016, 11:08 AM  Roney Marion, [place] Pager [phone] Office [phone]

## 2016-03-17 NOTE — Care Management Note (Signed)
Case Management Note  Patient Details  Name: Gabriel [name] MRN: [id number] Date of Birth: 07-07-1950  Subjective/Objective:    65 yr old male s/p left total knee arthroplasty                Action/Plan: Case manager spoke with patient and his wife concerning [place] and DME needs. Case manager discussed with patient that his [place] will have to be arranged by [facility] because of his [employer] coverage. CM called referral to [facility], patient's intake # is [id number]. CM faxed order,op note and demographics  To [phone]. Patient's start of care will be 12/7 if patient is discharged today. Some one from the [facility] will contact the patient to arrange start date and time for therapy.   Expected Discharge Date:    03/17/16              Expected Discharge Plan:  [place]  In-House Referral:     Discharge planning Services  CM Consult  Post Acute Care Choice:  Home Health Choice offered to:  Patient  DME Arranged:  3-N-1 DME Agency:  [place]:  PT [facility] Agency:  Other - See comment (to be arranged by [facility])  Status of Service:  In process, will continue to follow  If discussed at Long Length of Stay Meetings, dates discussed:    Additional Comments:  Ninfa Meeker, RN 03/17/2016, 12:11 PM

## 2016-03-17 NOTE — Discharge Summary (Signed)
Patient ID: Gabriel [name] MRN: [id number] DOB/AGE: [birth year] 64 y.o.  Admit date: 03/16/2016 Discharge date: 03/17/2016  Admission Diagnoses:  Principal Problem:   Unilateral primary osteoarthritis, left hip Active Problems:   Status post left hip replacement   Discharge Diagnoses:  Same  Past Medical History:  Diagnosis Date  . Achalasia   . Arthritis   . Colon cancer screening 08/11/2014   Sees [facility], Dr Carlean Purl   . Eczema 01/09/2014  . Esophageal reflux 05/21/2012  . Hyperlipidemia   . Hypertension   . Insomnia 01/09/2014  . Left hip pain 08/11/2014  . Otitis, externa, infective 03/31/2015  . Overweight 11/14/2012  . Skin lesion of left leg 10/07/2015  . Ulcer ([place])    esophageal ulcer hx    Surgeries: Procedure(s): LEFT TOTAL HIP ARTHROPLASTY ANTERIOR APPROACH on 03/16/2016   Consultants:   Discharged Condition: Improved  Hospital Course: Gabriel [name] is an 65 y.o. male who was admitted 03/16/2016 for operative treatment ofUnilateral primary osteoarthritis, left hip. Patient has severe unremitting pain that affects sleep, daily activities, and work/hobbies. After pre-op clearance the patient was taken to the operating room on 03/16/2016 and underwent  Procedure(s): LEFT TOTAL HIP ARTHROPLASTY ANTERIOR APPROACH.    Patient was given perioperative antibiotics: Anti-infectives    Start     Dose/Rate Route Frequency Ordered Stop   03/16/16 1930  ceFAZolin (ANCEF) IVPB 1 g/50 mL premix     1 g 100 mL/hr over 30 Minutes Intravenous Every 6 hours 03/16/16 1848 03/17/16 0520   03/16/16 1130  ceFAZolin (ANCEF) IVPB 2g/100 mL premix     2 g 200 mL/hr over 30 Minutes Intravenous To [facility] 03/15/16 1117 03/16/16 1328       Patient was given sequential compression devices, early ambulation, and chemoprophylaxis to prevent DVT.  Patient benefited maximally from hospital stay and there were no complications.    Recent vital signs: Patient Vitals for the past 24  hrs:  BP Temp Temp src Pulse Resp SpO2  03/17/16 0500 120/61 99.8 F (37.7 C) Oral 97 16 96 %  03/17/16 0020 113/60 (!) 100.4 F (38 C) Oral 96 16 96 %  03/16/16 2010 (!) 164/90 98.8 F (37.1 C) Oral (!) 102 16 99 %     Recent laboratory studies:  Recent Labs  03/17/16 0447  WBC 10.1  HGB 12.4*  HCT 37.7*  PLT 246  NA 131*  K 3.8  CL 96*  CO2 26  BUN 10  CREATININE 0.82  GLUCOSE 136*  CALCIUM 8.2*     Discharge Medications:     Medication List    STOP taking these medications   etodolac 500 MG tablet Commonly known as:  LODINE     TAKE these medications   ANTIOXIDANT VITAMINS Tabs Take 1 tablet by mouth daily. Life Guard   aspirin 81 MG chewable tablet Chew 1 tablet (81 mg total) by mouth 2 (two) times daily.   enalapril 10 MG tablet Commonly known as:  VASOTEC Take 1 tablet (10 mg total) by mouth daily.   hydrochlorothiazide 12.5 MG tablet Commonly known as:  HYDRODIURIL TAKE 1 TABLET (12.5 MG TOTAL) BY MOUTH DAILY.   hyoscyamine 0.125 MG SL tablet Commonly known as:  LEVSIN SL PLACE 1 TABLET (0.125 MG TOTAL) UNDER THE TONGUE EVERY 4 (FOUR) HOURS AS NEEDED.   methocarbamol 500 MG tablet Commonly known as:  ROBAXIN Take 1 tablet (500 mg total) by mouth every 6 (six) hours as needed for muscle spasms.  mupirocin ointment 2 % Commonly known as:  BACTROBAN Apply to b/l nares qhs x 7 days then as needed   omeprazole 40 MG capsule Commonly known as:  PRILOSEC Take 1 capsule (40 mg total) by mouth daily.   oxyCODONE-acetaminophen 5-325 MG tablet Commonly known as:  ROXICET Take 1-2 tablets by mouth every 4 (four) hours as needed.   pimecrolimus 1 % cream Commonly known as:  ELIDEL Apply 1 application topically daily. What changed:  when to take this  reasons to take this   Red Yeast Rice Extract 600 MG Tabs Take 1 tablet by mouth daily.            Durable Medical Equipment        Start     Ordered   03/16/16 1849  DME 3 n 1   Once     03/16/16 1848   03/16/16 1849  DME Walker rolling  Once    Question:  Patient needs a walker to treat with the following condition  Answer:  Status post left hip replacement   03/16/16 1848      Diagnostic Studies: Dg Pelvis Portable  Result Date: 03/16/2016 CLINICAL DATA:  Status post left hip replacement EXAM: PORTABLE PELVIS 1-2 VIEWS COMPARISON:  03/01/2016 FINDINGS: Left hip replacement is now seen in satisfactory position. No acute bony or soft tissue abnormality is noted. IMPRESSION: Status post left hip replacement. Electronically Signed   By: Inez Catalina M.D.   On: 03/16/2016 15:35   Dg C-arm 1-60 Min  Result Date: 03/16/2016 CLINICAL DATA:  Left hip arthroplasty EXAM: OPERATIVE left HIP (WITH PELVIS IF PERFORMED) 2 VIEWS TECHNIQUE: Fluoroscopic spot image(s) were submitted for interpretation post-operatively. COMPARISON:  None. FINDINGS: Two-view show total hip arthroplasty on the left. Components appear well positioned. No radiographically detectable complication. IMPRESSION: Good appearance following left hip arthroplasty Electronically Signed   By: Nelson Chimes M.D.   On: 03/16/2016 14:54   Dg Hip Operative Unilat With Pelvis Left  Result Date: 03/16/2016 CLINICAL DATA:  Left hip arthroplasty EXAM: OPERATIVE left HIP (WITH PELVIS IF PERFORMED) 2 VIEWS TECHNIQUE: Fluoroscopic spot image(s) were submitted for interpretation post-operatively. COMPARISON:  None. FINDINGS: Two-view show total hip arthroplasty on the left. Components appear well positioned. No radiographically detectable complication. IMPRESSION: Good appearance following left hip arthroplasty Electronically Signed   By: Nelson Chimes M.D.   On: 03/16/2016 14:54   Xr Pelvis 1-2 Views  Result Date: 03/01/2016 An AP pelvis shows severe and profound arthritis of his left hip. His right hip actually has quite cemented arthritis as well with almost complete loss of joint space, severe sclerotic changes and her  trigger osteophytes   Disposition:  To home  Discharge Instructions    Call MD / Call 911    Complete by:  As directed    If you experience chest pain or shortness of breath, CALL 911 and be transported to the hospital emergency room.  If you develope a fever above 101 F, pus (white drainage) or increased drainage or redness at the wound, or calf pain, call your surgeon's office.   Constipation Prevention    Complete by:  As directed    Drink plenty of fluids.  Prune juice may be helpful.  You may use a stool softener, such as Colace (over the counter) 100 mg twice a day.  Use MiraLax (over the counter) for constipation as needed.   Diet - low sodium heart healthy    Complete by:  As  directed    Discharge patient    Complete by:  As directed    Increase activity slowly as tolerated    Complete by:  As directed       Follow-up Information    Mcarthur Rossetti, MD. Schedule an appointment as soon as possible for a visit in 2 week(s).   Specialty:  Orthopedic Surgery Contact information: [address] [place] [address] [phone]            Signed: Mcarthur Rossetti 03/17/2016, 7:17 PM

## 2016-03-17 NOTE — Progress Notes (Signed)
Patient ID: Gabriel [name], male   DOB: 1950/05/14, 65 y.o.   MRN: [id number] Doing well.  Can discharge to home.

## 2016-03-17 NOTE — Discharge Instructions (Signed)

## 2016-03-18 ENCOUNTER — Telehealth (INDEPENDENT_AMBULATORY_CARE_PROVIDER_SITE_OTHER): Payer: Self-pay | Admitting: Orthopaedic Surgery

## 2016-03-18 NOTE — Op Note (Signed)
NAME:  Gabriel [name], Gabriel [name]                  ACCOUNT NO.:  MEDICAL RECORD NO.:  [id number]  LOCATION:                                 FACILITY:  PHYSICIAN:  Lind Guest. [name], M.D.DATE OF BIRTH:  DATE OF PROCEDURE:  03/16/2016 DATE OF DISCHARGE:                              OPERATIVE REPORT   PREOPERATIVE DIAGNOSES: 1. Severe primary osteoarthritis and degenerative joint disease, left     hip. 2. Right hip pain.  POSTOPERATIVE DIAGNOSES: 1. Severe primary osteoarthritis and degenerative joint disease, left     hip. 2. Right hip pain.  PROCEDURE: 1. Left total hip arthroplasty through direct anterior approach. 2. Right hip intra-articular steroid injection under fluoroscopy.  IMPLANTS:  DePuy Sector Gription acetabular component size 58, size 36+ 4 polyethylene liner, size 13 Corail femoral component with varus offset, size 36+ 1.5 ceramic hip ball.  SURGEON:  Lind Guest. [name], M.D.  ASSISTANT:  Erskine Emery, PA-C.  ANESTHESIA:  A mixture of 3 mL of 0.25% plain Marcaine mixed with 1 mL of 40 of Depo-Medrol for the right hip injection.  ANTIBIOTICS:  2 g of IV Ancef.  BLOOD LOSS:  200 mL.  COMPLICATIONS:  None.  INDICATIONS:  Mr. Oldenburg is a very pleasant 65 year old gentleman, patient of mine with debilitating arthritis involving his left hip.  He also has moderate arthritis on his right hip.  His pain is daily and is detrimentally affecting his activities of daily living, his mobility, and his quality of life.  At this point, he does wish to proceed with a total hip arthroplasty in his left hip, and while we were in the operating room, he would like an intra-articular injection in his right hip to the pain he has been having.  The risks of surgery were explained to him in detail including the risk of acute blood loss anemia, nerve and vessel injury, fracture, infection, dislocation, DVT.  He understands our goals are decreased pain, improved mobility,  and overall improved quality of life.  PROCEDURE DESCRIPTION:  After informed consent was obtained, appropriate left and right hips were marked with the left hip for surgery and the right hip for injection, he is brought to the operating room, where spinal anesthesia was obtained while he was on the stretcher.  A Foley catheter was placed and both feet had traction boots applied to them. Next, she was placed supine on the Hana fracture table with a perineal post in place and both legs in inline skeletal traction devices but no traction applied.  His right hip was first assessed and under direct fluoroscopy, we assessed his right hip.  We cleaned it with alcohol prep swabs and then under direct fluoroscopy was able to use an 18-gauge needle and syringe and placed a mixture of 3 mL of Marcaine plain 0.25% mixed with 1 mL of 40 of Depo-Medrol.  We placed a Band-Aid over this. We then assessed his left hip under direct fluoroscopy and we were able to assess his leg length and offset.  We then prepped his left hip with DuraPrep and sterile drapes.  Time-out was called and he was identified as correct patient and correct left hip for  hip replacement.  We then made an incision just inferior and posterior to the anterior-superior iliac spine and carried this obliquely down the leg.  We dissected down the tensor fascia lata muscle.  The tensor fascia was then divided longitudinally so we could proceed with direct anterior approach to the hip.  We identified and cauterized the circumflex vessels and identified the hip capsule.  I opened up the hip capsule finding a moderate joint effusion and significant periarticular osteophytes.  We placed Cobra retractors within the joint capsule around the medial and lateral femoral neck and we were able to make our femoral neck cut with an oscillating saw just proximal to the lesser trochanter and completed this with an osteotome.  We placed a corkscrew guide  in the femoral head and removed the femoral head in its entirety and found it to be devoid of cartilage.  We then placed a bent Hohmann over the medial acetabular rim and cleaned the remnants of acetabular labrum and other debris.  I released the transverse acetabular ligament.  I then began reaming under direct visualization from a size 42 reamer and significant increments all the way up to a size 58.  With all reamers under direct visualization and the last reamer under direct fluoroscopy, so we could obtain our depth of reaming, our inclination, and anteversion.  Once we were pleased with this, we placed the real DePuy Sector Gription acetabular component size 58 and a 36+ 4 neutral polyethylene liner for that size acetabular component.  Attention was then turned to the femur. With the leg externally rotated to 120 degrees, extended and adducted, we were able to place a Mueller retractor medially and a Hohmann retractor behind the greater trochanter.  I released the lateral joint capsule and used a box cutting osteotome to enter the femoral canal and a rongeur to lateralize.  We then began broaching from a size 8 broach using Corail broaching system up to a size 13.  Based off his anatomy and preoperative x-rays, we trialed a varus offset femoral neck and a 36+ 1.5 hip ball.  We rolled the leg back over and up with traction and internal rotation reducing the pelvis.  We were pleased with leg length, offset, and stability on assessing his range of motion.  We then dislocated the hip and removed the trial components.  We were able to easily place the real Corail femoral component with varus offset size 13 and the real 36+ 1.5 ceramic hip ball, reduced this once again in the pelvis and we were pleased with stability, leg length, and offset.  We then irrigated the soft tissue with normal saline solution using pulsatile lavage, closed the joint capsule with interrupted #1 Ethibond suture,  followed by running #1 Vicryl in the tensor fascia, 0 Vicryl in the deep tissue, 2-0 Vicryl in the subcutaneous tissue, 4-0 Monocryl subcuticular stitch and Steri-Strips on the skin.  An Aquacel dressing was then applied.  He was taken off the Hana table, taken to the recovery room in stable condition.  All final counts were correct. There were no complications noted.  Of note, Erskine Emery, PA-C assisted in entire case from beginning and his assistance was crucial for facilitating all aspects of this case.     Lind Guest. [name], M.D.     CYB/MEDQ  D:  03/16/2016  T:  03/17/2016  Job:  [id number]

## 2016-03-18 NOTE — Telephone Encounter (Signed)
Dorian with [facility] called needing verbal orders for (PT)  treatment 1 wk 1 and 3 wk 2. The number to contact him is [phone]

## 2016-03-18 NOTE — Telephone Encounter (Signed)
Verbal order given to Dorian

## 2016-03-19 ENCOUNTER — Other Ambulatory Visit: Payer: Self-pay | Admitting: *Deleted

## 2016-03-19 NOTE — Patient Outreach (Addendum)
[place] [facility]) Care Management  03/19/2016  Gabriel [name] 07-11-1950 [id number]   Subjective: Telephone call to patient's home / mobile number, no answer, left HIPAA compliant voicemail message, and requested call back.   Received voicemail message from patient, states he is returning call, and will call back on 03/22/16.     Objective: Per chart review: Patient hospitalized 12/ 5/17 - 03/17/16 for primary osteoarthritis, left hip.    Status post Left total hip arthroplasty through direct anterior approach. Right hip intra-articular steroid injection under fluoroscopy on 03/16/16.   Patient also has a history of hypertension.   Assessment: Received [employer] Transition of Care referral on 03/18/16.  Referral source: Wilhemina Cash.   Transition of care follow up, pending patient contact.    Plan: RNCM will send case status update to [facility].  RNCM will call patient for 2nd telephone outreach attempt, within 10 business days, if no return call.    Gabriel [name], BSN, [place] Management [facility] Telephonic CM Phone: [phone] Fax: [phone]

## 2016-03-22 ENCOUNTER — Ambulatory Visit: Payer: Self-pay | Admitting: *Deleted

## 2016-03-23 ENCOUNTER — Other Ambulatory Visit: Payer: Self-pay | Admitting: *Deleted

## 2016-03-23 NOTE — Patient Outreach (Addendum)
[place] [facility]) Care Management  03/23/2016  Gabriel [name] 1950/04/18 [id number]   Subjective: Telephone call to patient's home / mobile number, no answer, left HIPAA compliant voicemail message, and requested call back.      Objective: Per chart review: Patient hospitalized 12/ 5/17 - 03/17/16 for primary osteoarthritis, left hip.    Status post Left total hip arthroplasty through direct anterior approach. Right hip intra-articular steroid injection under fluoroscopy on 03/16/16.   Patient also has a history of hypertension.   Assessment: Received [employer] Transition of Care referral on 03/18/16.  Referral source: Wilhemina Cash.   Transition of care follow up, pending patient contact.    Plan: RNCM will call patient for 3rd telephone outreach attempt, within 10 business days, if no return call.    Gabriel [name] H. Annia Friendly, BSN, [place] Management [facility] Telephonic CM Phone: [phone] Fax: [phone]

## 2016-03-24 ENCOUNTER — Ambulatory Visit: Payer: Self-pay | Admitting: *Deleted

## 2016-03-29 ENCOUNTER — Ambulatory Visit: Payer: Self-pay | Admitting: *Deleted

## 2016-03-30 ENCOUNTER — Other Ambulatory Visit: Payer: Self-pay | Admitting: *Deleted

## 2016-03-30 ENCOUNTER — Encounter: Payer: Self-pay | Admitting: *Deleted

## 2016-03-30 ENCOUNTER — Ambulatory Visit (INDEPENDENT_AMBULATORY_CARE_PROVIDER_SITE_OTHER): Payer: Self-pay | Admitting: Orthopaedic Surgery

## 2016-03-30 DIAGNOSIS — Z96642 Presence of left artificial hip joint: Secondary | ICD-10-CM

## 2016-03-30 NOTE — Progress Notes (Signed)
The patient is now 2 weeks status post a left total hip arthroplasty through direct anterior approach. He is doing well. He is ambulating with just a cane. He's been on a baby aspirin twice a day. He says he has no issues. He is already driving. He reports some left knee pain.  On exam his incisions well-healed. There is noted significant seroma at all. I removed some of the Steri-Strips are replaced the others. He tolerated a putting his hip the range of motion left side. He does have some ligamentous knee pain associated with the surgery itself.  At this point a continued increase his activities as he tolerates. He can stop his baby aspirin by will have him take 2 Aleve twice daily with food as needed for inflammation and pain. He also has some Percocet still left over. We'll see him back in a month to see how is doing overall but no x-rays are needed.

## 2016-03-30 NOTE — Patient Outreach (Signed)
[name]) Care Management  03/30/2016  Gabriel [name] Jan 22, 1951 [id number]   Subjective: Received voicemail message from patient, states he is returning call, and requested call back.  Telephone call to patient's home / mobile number, no answer, left HIPAA compliant voicemail message, and requested call back. Telephone call from patient and HIPAA verified.   Discussed [facility] Care Management [employer] Transition of care follow up, patient voices understanding, and is in agreement to complete follow up.  Patient states he is doing great, does not have pain, and is sitting outside on the patio enjoying the nice weather.   Sates he feels so much better since the surgery and is now realizing how much pain he was in prior to surgery.   Patient is receiving home health physical therapy and last visit will be on 04/02/16.   Had follow up appointment with surgeon today, had a good visit with MD, and no issues identified. States he will have a follow up appointment with primary MD on 04/08/16.   States he is currently out of work and family medical leave act [employer]) has been approved. Patient states he does not have any transition of care, care coordination, disease management, disease monitoring, transportation, community resource, or pharmacy needs at this time.  States he is very appreciative of the follow up call and is in agreement to receive [place] Management information.    Objective: Per chart review: Patient hospitalized 12/ 5/17 - 03/17/16 for primary osteoarthritis, left hip. Status post Left total hip arthroplasty through direct anterior approach. Right hip intra-articular steroid injection under fluoroscopy on 03/16/16. Patient also has a history of hypertension.   Assessment: Received [employer] Transition of Care referral on 03/18/16. Referral source: Wilhemina Cash. Transition of care follow up completed, no care management needs, and will proceed with case closure.     Plan: RNCM will send patient successful outreach letter, [facility] pamphlet, and magnet.  RNCM will send case closure due to follow up completed / no care management needs request to [facility].      Gabriel [name], BSN, [place] Management [facility] Telephonic CM Phone: [phone] Fax: [phone]

## 2016-04-08 ENCOUNTER — Ambulatory Visit (INDEPENDENT_AMBULATORY_CARE_PROVIDER_SITE_OTHER): Payer: Managed Care, Other (non HMO) | Admitting: Family Medicine

## 2016-04-08 ENCOUNTER — Encounter: Payer: Self-pay | Admitting: Family Medicine

## 2016-04-08 DIAGNOSIS — K219 Gastro-esophageal reflux disease without esophagitis: Secondary | ICD-10-CM | POA: Diagnosis not present

## 2016-04-08 DIAGNOSIS — R739 Hyperglycemia, unspecified: Secondary | ICD-10-CM

## 2016-04-08 DIAGNOSIS — M25552 Pain in left hip: Secondary | ICD-10-CM

## 2016-04-08 DIAGNOSIS — I10 Essential (primary) hypertension: Secondary | ICD-10-CM

## 2016-04-08 DIAGNOSIS — E782 Mixed hyperlipidemia: Secondary | ICD-10-CM

## 2016-04-08 NOTE — Assessment & Plan Note (Signed)
Encouraged heart healthy diet, increase exercise, avoid trans fats, consider a krill oil cap daily 

## 2016-04-08 NOTE — Patient Instructions (Signed)
Carbohydrate Counting for Diabetes Mellitus, Adult Carbohydrate counting is a method for keeping track of how many carbohydrates you eat. Eating carbohydrates naturally increases the amount of sugar (glucose) in the blood. Counting how many carbohydrates you eat helps keep your blood glucose within normal limits, which helps you manage your diabetes (diabetes mellitus). It is important to know how many carbohydrates you can safely have in each meal. This is different for every person. A diet and nutrition specialist (registered dietitian) can help you make a meal plan and calculate how many carbohydrates you should have at each meal and snack. Carbohydrates are found in the following foods:  Grains, such as breads and cereals.  Dried beans and soy products.  Starchy vegetables, such as potatoes, peas, and corn.  Fruit and fruit juices.  Milk and yogurt.  Sweets and snack foods, such as cake, cookies, candy, chips, and soft drinks. How do I count carbohydrates? There are two ways to count carbohydrates in food. You can use either of the methods or a combination of both. Reading "Nutrition Facts" on packaged food  The "Nutrition Facts" list is included on the labels of almost all packaged foods and beverages in the [place] It includes:  The serving size.  Information about nutrients in each serving, including the grams (g) of carbohydrate per serving. To use the "Nutrition Facts":  Decide how many servings you will have.  Multiply the number of servings by the number of carbohydrates per serving.  The resulting number is the total amount of carbohydrates that you will be having. Learning standard serving sizes of other foods  When you eat foods containing carbohydrates that are not packaged or do not include "Nutrition Facts" on the label, you need to measure the servings in order to count the amount of carbohydrates:  Measure the foods that you will eat with a food scale or measuring  cup, if needed.  Decide how many standard-size servings you will eat.  Multiply the number of servings by 15. Most carbohydrate-rich foods have about 15 g of carbohydrates per serving.  For example, if you eat 8 oz (170 g) of strawberries, you will have eaten 2 servings and 30 g of carbohydrates (2 servings x 15 g = 30 g).  For foods that have more than one food mixed, such as soups and casseroles, you must count the carbohydrates in each food that is included. The following list contains standard serving sizes of common carbohydrate-rich foods. Each of these servings has about 15 g of carbohydrates:   hamburger bun or  English muffin.   oz (15 mL) syrup.   oz (14 g) jelly.  1 slice of bread.  1 six-inch tortilla.  3 oz (85 g) cooked rice or pasta.  4 oz (113 g) cooked dried beans.  4 oz (113 g) starchy vegetable, such as peas, corn, or potatoes.  4 oz (113 g) hot cereal.  4 oz (113 g) mashed potatoes or  of a large baked potato.  4 oz (113 g) canned or frozen fruit.  4 oz (120 mL) fruit juice.  4-6 crackers.  6 chicken nuggets.  6 oz (170 g) unsweetened dry cereal.  6 oz (170 g) plain fat-free yogurt or yogurt sweetened with artificial sweeteners.  8 oz (240 mL) milk.  8 oz (170 g) fresh fruit or one small piece of fruit.  24 oz (680 g) popped popcorn. Example of carbohydrate counting Sample meal  3 oz (85 g) chicken breast.  6 oz (  170 g) brown rice.  4 oz (113 g) corn.  8 oz (240 mL) milk.  8 oz (170 g) strawberries with sugar-free whipped topping. Carbohydrate calculation 1. Identify the foods that contain carbohydrates:  Rice.  Corn.  Milk.  Strawberries. 2. Calculate how many servings you have of each food:  2 servings rice.  1 serving corn.  1 serving milk.  1 serving strawberries. 3. Multiply each number of servings by 15 g:  2 servings rice x 15 g = 30 g.  1 serving corn x 15 g = 15 g.  1 serving milk x 15 g = 15  g.  1 serving strawberries x 15 g = 15 g. 4. Add together all of the amounts to find the total grams of carbohydrates eaten:  30 g + 15 g + 15 g + 15 g = 75 g of carbohydrates total. This information is not intended to replace advice given to you by your health care provider. Make sure you discuss any questions you have with your health care provider. Document Released: 03/29/2005 Document Revised: 10/17/2015 Document Reviewed: 09/10/2015 Elsevier Interactive Patient Education  2017 [employer]  

## 2016-04-08 NOTE — Assessment & Plan Note (Signed)
Well controlled, no changes to meds. Encouraged heart healthy diet such as the DASH diet and exercise as tolerated.  °

## 2016-04-08 NOTE — Progress Notes (Signed)
Pre visit review using our clinic review tool, if applicable. No additional management support is needed unless otherwise documented below in the visit note. 

## 2016-04-08 NOTE — Assessment & Plan Note (Signed)
hgba1c acceptable, minimize simple carbs. Increase exercise as tolerated. Continue current meds 

## 2016-04-08 NOTE — Assessment & Plan Note (Signed)
Avoid offending foods, start probiotics. Do not eat large meals in late evening and consider raising head of bed.  

## 2016-04-09 LAB — COMPREHENSIVE METABOLIC PANEL
ALBUMIN: 4.5 g/dL (ref 3.5–5.2)
ALT: 19 U/L (ref 0–53)
AST: 14 U/L (ref 0–37)
Alkaline Phosphatase: 70 U/L (ref 39–117)
BILIRUBIN TOTAL: 0.5 mg/dL (ref 0.2–1.2)
BUN: 12 mg/dL (ref 6–23)
CALCIUM: 9.7 mg/dL (ref 8.4–10.5)
CO2: 30 meq/L (ref 19–32)
CREATININE: 0.77 mg/dL (ref 0.40–1.50)
Chloride: 95 mEq/L — ABNORMAL LOW (ref 96–112)
GFR: 108.01 mL/min (ref >60.00)
Glucose, Bld: 104 mg/dL — ABNORMAL HIGH (ref 70–99)
Potassium: 4.5 mEq/L (ref 3.5–5.1)
Sodium: 134 mEq/L — ABNORMAL LOW (ref 135–145)
Total Protein: 6.9 g/dL (ref 6.0–8.3)

## 2016-04-09 LAB — CBC
HCT: 39.6 % (ref 39.0–52.0)
Hemoglobin: 13.5 g/dL (ref 13.0–17.0)
MCHC: 34.2 g/dL (ref 30.0–36.0)
MCV: 93.3 fl (ref 78.0–100.0)
PLATELETS: 393 10*3/uL (ref 150.0–400.0)
RBC: 4.24 Mil/uL (ref 4.22–5.81)
RDW: 13.1 % (ref 11.5–15.5)
WBC: 8.6 10*3/uL (ref 4.0–10.5)

## 2016-04-09 LAB — HEMOGLOBIN A1C: HEMOGLOBIN A1C: 5.6 % (ref 4.6–6.5)

## 2016-04-09 LAB — LIPID PANEL
CHOLESTEROL: 204 mg/dL — AB (ref 0–200)
HDL: 59 mg/dL (ref >39.00)
LDL CALC: 120 mg/dL — AB (ref 0–99)
NONHDL: 144.95
Total CHOL/HDL Ratio: 3
Triglycerides: 125 mg/dL (ref 0.0–149.0)
VLDL: 25 mg/dL (ref 0.0–40.0)

## 2016-04-09 LAB — TSH: TSH: 0.84 u[IU]/mL (ref 0.35–4.50)

## 2016-04-14 NOTE — Progress Notes (Signed)
Patient ID: Gabriel [name], male   DOB: July 23, 1950, 66 y.o.   MRN: [id number]   Subjective:    Patient ID: Gabriel [name], male    DOB: 01-01-1951, 66 y.o.   MRN: [id number]  Chief Complaint  Patient presents with  . Follow-up    HPI Patient is in today for follow up. He feels well today. No recent febrile illness. He underwent a total hip replacement 23 days ago and is healing well. No ongoing debility is noted. Denies CP/palp/SOB/HA/congestion/fevers/GI or GU c/o. Taking meds as prescribed  Past Medical History:  Diagnosis Date  . Achalasia   . Arthritis   . Colon cancer screening 08/11/2014   Sees [facility], Dr Carlean Purl   . Eczema 01/09/2014  . Esophageal reflux 05/21/2012  . Hyperlipidemia   . Hypertension   . Insomnia 01/09/2014  . Left hip pain 08/11/2014  . Otitis, externa, infective 03/31/2015  . Overweight 11/14/2012  . Skin lesion of left leg 10/07/2015  . Ulcer ([place])    esophageal ulcer hx    Past Surgical History:  Procedure Laterality Date  . COLONOSCOPY    . ESOPHAGOGASTRODUODENOSCOPY    . HELLER MYOTOMY  1999   help with swallowing  . TONSILLECTOMY    . TONSILLECTOMY AND ADENOIDECTOMY  1963  . 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   Procedure: LEFT TOTAL HIP ARTHROPLASTY ANTERIOR APPROACH;  Surgeon: Mcarthur Rossetti, MD;  Location: [facility];  Service: Orthopedics;  Laterality: Left;  . UPPER GASTROINTESTINAL ENDOSCOPY      Family History  Problem Relation Age of Onset  . Alcohol abuse Mother   . Ovarian cancer Mother   . Diabetes Maternal Grandmother   . Alcohol abuse Father   . Lung cancer Father   . Mental illness Sister   . Depression Sister   . Heart disease Brother     congenital heart  . Arthritis Sister   . Hypertension Sister   . Diverticulosis Sister   . Healthy Sister   . Healthy Sister   . Healthy Sister   . Colon cancer Neg Hx   . Esophageal cancer Neg Hx   . Rectal cancer Neg Hx   . Stomach cancer Neg Hx     Social History   Social  History  . Marital status: Married    Spouse name: N/A  . Number of children: 2  . Years of education: N/A   Occupational History  . 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 Smoking status: Former Smoker    Types: Cigarettes    Quit date: 04/13/1991  . Smokeless tobacco: Never Used     Comment: 1 ppd for 25 years quit 57'  . Alcohol use 4.2 oz/week    7 Glasses of wine per week     Comment: Wine nightly  . Drug use: No  . Sexual activity: Not on file   Other Topics Concern  . Not on file   Social History Narrative   Married 2 daughters   Scientific laboratory technician for [employer]          Outpatient Medications Prior to Visit  Medication Sig Dispense Refill  . aspirin 81 MG chewable tablet Chew 1 tablet (81 mg total) by mouth 2 (two) times daily. 30 tablet 0  . enalapril (VASOTEC) 10 MG tablet Take 1 tablet (10 mg total) by mouth daily. 90 tablet 1  . hydrochlorothiazide (HYDRODIURIL) 12.5 MG tablet TAKE 1 TABLET (12.5 MG TOTAL)  BY MOUTH DAILY. 30 tablet 6  . hyoscyamine (LEVSIN SL) 0.125 MG SL tablet PLACE 1 TABLET (0.125 MG TOTAL) UNDER THE TONGUE EVERY 4 (FOUR) HOURS AS NEEDED. 30 tablet 1  . methocarbamol (ROBAXIN) 500 MG tablet Take 1 tablet (500 mg total) by mouth every 6 (six) hours as needed for muscle spasms. 60 tablet 0  . Multiple Vitamins-Minerals (ANTIOXIDANT VITAMINS) TABS Take 1 tablet by mouth daily. Life Guard    . mupirocin ointment (BACTROBAN) 2 % Apply to b/l nares qhs x 7 days then as needed 22 g 1  . naproxen sodium (ANAPROX) 220 MG tablet Take 220 mg by mouth 2 (two) times daily with a meal.    . omeprazole (PRILOSEC) 40 MG capsule Take 1 capsule (40 mg total) by mouth daily. 90 capsule 2  . pimecrolimus (ELIDEL) 1 % cream Apply 1 application topically daily. (Patient taking differently: Apply 1 application topically daily as needed. ) 60 g 5  . Red Yeast Rice Extract 600 MG TABS Take 1 tablet by mouth daily.      Marland Kitchen oxyCODONE-acetaminophen (ROXICET) 5-325 MG tablet Take 1-2 tablets by mouth every 4 (four) hours as needed. 60 tablet 0   No facility-administered medications prior to visit.     Allergies  Allergen Reactions  . No Known Allergies     Review of Systems  Constitutional: Negative for fever and malaise/fatigue.  HENT: Negative for congestion.   Eyes: Negative for blurred vision.  Respiratory: Negative for shortness of breath.   Cardiovascular: Negative for chest pain, palpitations and leg swelling.  Gastrointestinal: Negative for abdominal pain, blood in stool and nausea.  Genitourinary: Negative for dysuria and frequency.  Musculoskeletal: Negative for falls.  Skin: Negative for rash.  Neurological: Negative for dizziness, loss of consciousness and headaches.  Endo/Heme/Allergies: Negative for environmental allergies.  Psychiatric/Behavioral: Negative for depression. The patient is not nervous/anxious.        Objective:    Physical Exam  Constitutional: He is oriented to person, place, and time. He appears well-developed and well-nourished. No distress.  HENT:  Head: Normocephalic and atraumatic.  Nose: Nose normal.  Eyes: Right eye exhibits no discharge. Left eye exhibits no discharge.  Neck: Normal range of motion. Neck supple.  Cardiovascular: Normal rate and regular rhythm.   No murmur heard. Pulmonary/Chest: Effort normal and breath sounds normal.  Abdominal: Soft. Bowel sounds are normal. There is no tenderness.  Musculoskeletal: He exhibits no edema.  Neurological: He is alert and oriented to person, place, and time.  Skin: Skin is warm and dry.  Psychiatric: He has a normal mood and affect.  Nursing note and vitals reviewed.   BP 120/72 (BP Location: Left Arm, Patient Position: Sitting, Cuff Size: Normal)   Pulse 95   Temp 98.5 F (36.9 C) (Oral)   Ht 5\' 6"  (1.676 m)   Wt 186 lb 2 oz (84.4 kg)   SpO2 97%   BMI 30.04 kg/m  Wt Readings from Last 3  Encounters:  04/08/16 186 lb 2 oz (84.4 kg)  03/16/16 182 lb (82.6 kg)  03/08/16 182 lb 6 oz (82.7 kg)     Lab Results  Component Value Date   WBC 8.6 04/08/2016   HGB 13.5 04/08/2016   HCT 39.6 04/08/2016   PLT 393.0 04/08/2016   GLUCOSE 104 (H) 04/08/2016   CHOL 204 (H) 04/08/2016   TRIG 125.0 04/08/2016   HDL 59.00 04/08/2016   LDLCALC 120 (H) 04/08/2016   ALT 19 04/08/2016  AST 14 04/08/2016   NA 134 (L) 04/08/2016   K 4.5 04/08/2016   CL 95 (L) 04/08/2016   CREATININE 0.77 04/08/2016   BUN 12 04/08/2016   CO2 30 04/08/2016   TSH 0.84 04/08/2016   PSA 3.27 10/02/2015   HGBA1C 5.6 04/08/2016    Lab Results  Component Value Date   TSH 0.84 04/08/2016   Lab Results  Component Value Date   WBC 8.6 04/08/2016   HGB 13.5 04/08/2016   HCT 39.6 04/08/2016   MCV 93.3 04/08/2016   PLT 393.0 04/08/2016   Lab Results  Component Value Date   NA 134 (L) 04/08/2016   K 4.5 04/08/2016   CO2 30 04/08/2016   GLUCOSE 104 (H) 04/08/2016   BUN 12 04/08/2016   CREATININE 0.77 04/08/2016   BILITOT 0.5 04/08/2016   ALKPHOS 70 04/08/2016   AST 14 04/08/2016   ALT 19 04/08/2016   PROT 6.9 04/08/2016   ALBUMIN 4.5 04/08/2016   CALCIUM 9.7 04/08/2016   ANIONGAP 9 03/17/2016   GFR 108.01 04/08/2016   Lab Results  Component Value Date   CHOL 204 (H) 04/08/2016   Lab Results  Component Value Date   HDL 59.00 04/08/2016   Lab Results  Component Value Date   LDLCALC 120 (H) 04/08/2016   Lab Results  Component Value Date   TRIG 125.0 04/08/2016   Lab Results  Component Value Date   CHOLHDL 3 04/08/2016   Lab Results  Component Value Date   HGBA1C 5.6 04/08/2016       Assessment & Plan:   Problem List Items Addressed This Visit    HTN (hypertension)    Well controlled, no changes to meds. Encouraged heart healthy diet such as the DASH diet and exercise as tolerated.       Relevant Orders   CBC (Completed)   Comprehensive metabolic panel (Completed)    TSH (Completed)   Hyperlipidemia    Encouraged heart healthy diet, increase exercise, avoid trans fats, consider a krill oil cap daily      Relevant Orders   Lipid panel (Completed)   Esophageal reflux    Avoid offending foods, start probiotics. Do not eat large meals in late evening and consider raising head of bed.       Hyperglycemia    hgba1c acceptable, minimize simple carbs. Increase exercise as tolerated. Continue current meds      Relevant Orders   Hemoglobin A1c (Completed)   Left hip pain    S/p THR and doing well.          I have discontinued Mr. Griscom oxyCODONE-acetaminophen. I am also having him maintain his Red Yeast Rice Extract, mupirocin ointment, pimecrolimus, enalapril, omeprazole, hydrochlorothiazide, hyoscyamine, ANTIOXIDANT VITAMINS, methocarbamol, aspirin, and naproxen sodium.  No orders of the defined types were placed in this encounter.    Penni Homans, MD

## 2016-04-14 NOTE — Assessment & Plan Note (Signed)
S/p THR and doing well.

## 2016-04-20 ENCOUNTER — Other Ambulatory Visit: Payer: Self-pay | Admitting: Family Medicine

## 2016-04-20 ENCOUNTER — Encounter: Payer: Self-pay | Admitting: Family Medicine

## 2016-04-20 DIAGNOSIS — E781 Pure hyperglyceridemia: Secondary | ICD-10-CM

## 2016-04-20 DIAGNOSIS — I10 Essential (primary) hypertension: Secondary | ICD-10-CM

## 2016-04-20 DIAGNOSIS — R040 Epistaxis: Secondary | ICD-10-CM

## 2016-04-20 DIAGNOSIS — R739 Hyperglycemia, unspecified: Secondary | ICD-10-CM

## 2016-04-20 DIAGNOSIS — K219 Gastro-esophageal reflux disease without esophagitis: Secondary | ICD-10-CM

## 2016-04-20 MED ORDER — MUPIROCIN 2 % EX OINT: TOPICAL_OINTMENT | CUTANEOUS | 1 refills | Status: DC

## 2016-04-21 ENCOUNTER — Other Ambulatory Visit: Payer: Self-pay | Admitting: Family Medicine

## 2016-04-27 ENCOUNTER — Ambulatory Visit (INDEPENDENT_AMBULATORY_CARE_PROVIDER_SITE_OTHER): Payer: Self-pay | Admitting: Orthopaedic Surgery

## 2016-04-27 DIAGNOSIS — Z96642 Presence of left artificial hip joint: Secondary | ICD-10-CM

## 2016-04-27 NOTE — Progress Notes (Signed)
The patient is now 6 weeks status post a left total hip arthroplasty through direct anterior approach. 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 He says his left hip is doing great. He denies any significant pain. He takes Advil or Aleve as needed. He is reporting some pain in the groin on the right side. He does have a little bit of numbness and tingling says on the left side.  On examination his left hip range of motion is excellent mobility. It is pain-free. He has some subjective numbness around his incision. His knee exam on the left side is normal. His right hip has pain with interaction rotation of his right hip.  At this point a continued increase his activities. We really don't need to see him back for 6 months. At that visit I like a low AP pelvis. I told him if his right hip show him enough he can see [place] sooner. We can always these in order an intra-articular injection of the right hip if needed.

## 2016-05-06 ENCOUNTER — Encounter (INDEPENDENT_AMBULATORY_CARE_PROVIDER_SITE_OTHER): Payer: Self-pay

## 2016-05-20 ENCOUNTER — Ambulatory Visit (INDEPENDENT_AMBULATORY_CARE_PROVIDER_SITE_OTHER): Payer: Managed Care, Other (non HMO) | Admitting: Orthopedic Surgery

## 2016-06-01 ENCOUNTER — Other Ambulatory Visit: Payer: Self-pay | Admitting: Family Medicine

## 2016-06-30 ENCOUNTER — Other Ambulatory Visit: Payer: Self-pay | Admitting: Family Medicine

## 2016-06-30 DIAGNOSIS — R739 Hyperglycemia, unspecified: Secondary | ICD-10-CM

## 2016-06-30 DIAGNOSIS — E781 Pure hyperglyceridemia: Secondary | ICD-10-CM

## 2016-06-30 DIAGNOSIS — I10 Essential (primary) hypertension: Secondary | ICD-10-CM

## 2016-06-30 DIAGNOSIS — K219 Gastro-esophageal reflux disease without esophagitis: Secondary | ICD-10-CM

## 2016-07-26 ENCOUNTER — Other Ambulatory Visit: Payer: Self-pay | Admitting: Family Medicine

## 2016-08-16 ENCOUNTER — Other Ambulatory Visit: Payer: Self-pay | Admitting: Family Medicine

## 2016-08-16 DIAGNOSIS — R739 Hyperglycemia, unspecified: Secondary | ICD-10-CM

## 2016-08-16 DIAGNOSIS — E781 Pure hyperglyceridemia: Secondary | ICD-10-CM

## 2016-08-16 DIAGNOSIS — I10 Essential (primary) hypertension: Secondary | ICD-10-CM

## 2016-08-16 DIAGNOSIS — K219 Gastro-esophageal reflux disease without esophagitis: Secondary | ICD-10-CM

## 2016-09-09 ENCOUNTER — Encounter: Payer: Self-pay | Admitting: Family Medicine

## 2016-09-09 NOTE — Progress Notes (Signed)
Upper GI endoscopy    Dilation in the entire esophagus Fluid in the middle third of the esophagus and in the lower third esophagus. Fluid aspiration performed. Benign appearing esophageal stenosis. Dilated Normal stomach  Normal examined duodenum

## 2016-09-30 ENCOUNTER — Telehealth: Payer: Self-pay | Admitting: Family Medicine

## 2016-09-30 DIAGNOSIS — E782 Mixed hyperlipidemia: Secondary | ICD-10-CM

## 2016-09-30 DIAGNOSIS — R739 Hyperglycemia, unspecified: Secondary | ICD-10-CM

## 2016-09-30 DIAGNOSIS — I10 Essential (primary) hypertension: Secondary | ICD-10-CM

## 2016-09-30 NOTE — Telephone Encounter (Signed)
Ok to order lipid, cmp, cbc, tsh, and hgba1c for hyperglycemia, hyperlipidemia, htn

## 2016-09-30 NOTE — Telephone Encounter (Signed)
Patient called in asking can he get his labs put in for CPE 10/12/16

## 2016-10-01 NOTE — Telephone Encounter (Signed)
Orders have been placed.    PC 

## 2016-10-05 ENCOUNTER — Other Ambulatory Visit (INDEPENDENT_AMBULATORY_CARE_PROVIDER_SITE_OTHER): Payer: Managed Care, Other (non HMO)

## 2016-10-05 DIAGNOSIS — E782 Mixed hyperlipidemia: Secondary | ICD-10-CM | POA: Diagnosis not present

## 2016-10-05 DIAGNOSIS — R739 Hyperglycemia, unspecified: Secondary | ICD-10-CM

## 2016-10-05 DIAGNOSIS — E871 Hypo-osmolality and hyponatremia: Secondary | ICD-10-CM

## 2016-10-05 DIAGNOSIS — I10 Essential (primary) hypertension: Secondary | ICD-10-CM | POA: Diagnosis not present

## 2016-10-06 LAB — CBC
HEMATOCRIT: 43 % (ref 39.0–52.0)
HEMOGLOBIN: 14.6 g/dL (ref 13.0–17.0)
MCHC: 34 g/dL (ref 30.0–36.0)
MCV: 93.2 fl (ref 78.0–100.0)
Platelets: 314 10*3/uL (ref 150.0–400.0)
RBC: 4.61 Mil/uL (ref 4.22–5.81)
RDW: 13.2 % (ref 11.5–15.5)
WBC: 8.6 10*3/uL (ref 4.0–10.5)

## 2016-10-06 LAB — COMPREHENSIVE METABOLIC PANEL
ALBUMIN: 4.4 g/dL (ref 3.5–5.2)
ALT: 18 U/L (ref 0–53)
AST: 18 U/L (ref 0–37)
Alkaline Phosphatase: 53 U/L (ref 39–117)
BILIRUBIN TOTAL: 0.6 mg/dL (ref 0.2–1.2)
BUN: 11 mg/dL (ref 6–23)
CALCIUM: 9.6 mg/dL (ref 8.4–10.5)
CHLORIDE: 96 meq/L (ref 96–112)
CO2: 27 meq/L (ref 19–32)
Creatinine, Ser: 0.84 mg/dL (ref 0.40–1.50)
GFR: 97.54 mL/min (ref >60.00)
Glucose, Bld: 97 mg/dL (ref 70–99)
Potassium: 4.7 mEq/L (ref 3.5–5.1)
Sodium: 132 mEq/L — ABNORMAL LOW (ref 135–145)
Total Protein: 6.6 g/dL (ref 6.0–8.3)

## 2016-10-06 LAB — LIPID PANEL
CHOLESTEROL: 186 mg/dL (ref 0–200)
HDL: 57.4 mg/dL (ref >39.00)
LDL CALC: 110 mg/dL — AB (ref 0–99)
NonHDL: 128.48
Total CHOL/HDL Ratio: 3
Triglycerides: 91 mg/dL (ref 0.0–149.0)
VLDL: 18.2 mg/dL (ref 0.0–40.0)

## 2016-10-06 LAB — TSH: TSH: 0.76 u[IU]/mL (ref 0.35–4.50)

## 2016-10-06 LAB — HEMOGLOBIN A1C: HEMOGLOBIN A1C: 5.7 % (ref 4.6–6.5)

## 2016-10-12 ENCOUNTER — Ambulatory Visit (INDEPENDENT_AMBULATORY_CARE_PROVIDER_SITE_OTHER): Payer: Managed Care, Other (non HMO) | Admitting: Family Medicine

## 2016-10-12 ENCOUNTER — Encounter: Payer: Self-pay | Admitting: Family Medicine

## 2016-10-12 VITALS — BP 118/72 | HR 72 | Temp 98.1°F | Resp 18 | Wt 187.6 lb

## 2016-10-12 DIAGNOSIS — M25551 Pain in right hip: Secondary | ICD-10-CM

## 2016-10-12 DIAGNOSIS — E785 Hyperlipidemia, unspecified: Secondary | ICD-10-CM | POA: Diagnosis not present

## 2016-10-12 DIAGNOSIS — K222 Esophageal obstruction: Secondary | ICD-10-CM | POA: Diagnosis not present

## 2016-10-12 DIAGNOSIS — Z0000 Encounter for general adult medical examination without abnormal findings: Secondary | ICD-10-CM | POA: Diagnosis not present

## 2016-10-12 DIAGNOSIS — E663 Overweight: Secondary | ICD-10-CM

## 2016-10-12 DIAGNOSIS — R739 Hyperglycemia, unspecified: Secondary | ICD-10-CM

## 2016-10-12 DIAGNOSIS — K635 Polyp of colon: Secondary | ICD-10-CM

## 2016-10-12 DIAGNOSIS — I10 Essential (primary) hypertension: Secondary | ICD-10-CM | POA: Diagnosis not present

## 2016-10-12 DIAGNOSIS — H9312 Tinnitus, left ear: Secondary | ICD-10-CM

## 2016-10-12 DIAGNOSIS — H9319 Tinnitus, unspecified ear: Secondary | ICD-10-CM

## 2016-10-12 HISTORY — DX: Tinnitus, unspecified ear: H93.19

## 2016-10-12 MED ORDER — HYDROCORTISONE-ACETIC ACID 1-2 % OT SOLN: 3.0000 [drp] | Freq: Three times a day (TID) | OTIC | 0 refills | Status: DC

## 2016-10-12 NOTE — Assessment & Plan Note (Signed)
Well controlled, no changes to meds. Encouraged heart healthy diet such as the DASH diet and exercise as tolerated.  °

## 2016-10-12 NOTE — Progress Notes (Signed)
Subjective:  I acted as a Education administrator for Dr. Charlett Blake. [place], [place]  Patient ID: Gabriel [name], male    DOB: 1950-04-15, 66 y.o.   MRN: [id number]  No chief complaint on file.   HPI  Patient is in today for annual exam. Patient has no acute concerns at this time. No recent febrile illness or acute hospitalizations. Denies CP/palp/SOB/HA/congestion/fevers or GU c/o. Taking meds as prescribed. He recently underwent an esophageal stretching at [facility] which was the first time he need this since his surgery in 2015. He finds Hyoscyamine helpful when he gets the GI spasms. No recent febrille illness or hospitalizations. He is staying active and trying to eat a heart healthy diet   Patient Care Team: Mosie Lukes, MD as PCP - General (Family Medicine)   Past Medical History:  Diagnosis Date  . Achalasia   . Arthritis   . Colon cancer screening 08/11/2014   Sees [facility], Dr Carlean Purl   . Colon polyp 08/11/2014   Se[facility], Dr Carlean Purl    . Eczema 01/09/2014  . Esophageal reflux 05/21/2012  . Hyperlipidemia   . Hypertension   . Insomnia 01/09/2014  . Left hip pain 08/11/2014  . Otitis, externa, infective 03/31/2015  . Overweight 11/14/2012  . Right hip pain 08/11/2014   Sees chiropractor, Dr Clovis Riley in [place]   . Skin lesion of left leg 10/07/2015  . Tinnitus 10/12/2016  . Ulcer    esophageal ulcer hx    Past Surgical History:  Procedure Laterality Date  . COLONOSCOPY    . ESOPHAGOGASTRODUODENOSCOPY    . HELLER MYOTOMY  1999   help with swallowing  . TONSILLECTOMY    . TONSILLECTOMY AND ADENOIDECTOMY  1963  . 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   Procedure: LEFT TOTAL HIP ARTHROPLASTY ANTERIOR APPROACH;  Surgeon: Mcarthur Rossetti, MD;  Location: [facility];  Service: Orthopedics;  Laterality: Left;  . UPPER GASTROINTESTINAL ENDOSCOPY      Family History  Problem Relation Age of Onset  . Alcohol abuse Mother   . Ovarian cancer Mother   . Diabetes Maternal Grandmother   . Alcohol abuse Father     . Lung cancer Father   . Mental illness Sister   . Depression Sister   . Heart disease Brother        congenital heart  . Arthritis Sister   . Hypertension Sister   . Diverticulosis Sister   . Healthy Sister   . Healthy Sister   . Healthy Sister   . Colon cancer Neg Hx   . Esophageal cancer Neg Hx   . Rectal cancer Neg Hx   . Stomach cancer Neg Hx     Social History   Social History  . Marital status: Married    Spouse name: N/A  . Number of children: 2  . Years of education: N/A   Occupational History  . 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 Smoking status: Former Smoker    Types: Cigarettes    Quit date: 04/13/1991  . Smokeless tobacco: Never Used     Comment: 1 ppd for 25 years quit 23'  . Alcohol use 4.2 oz/week    7 Glasses of wine per week     Comment: Wine nightly  . Drug use: No  . Sexual activity: Not on file   Other Topics Concern  . 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 enalapril (VASOTEC) 10 MG tablet TAKE 1 TABLET (10 MG TOTAL) BY MOUTH DAILY. 90 tablet 2  . etodolac (LODINE) 500 MG tablet TAKE 1 TABLET BY MOUTH TWICE A DAY 60 tablet 0  . hydrochlorothiazide (HYDRODIURIL) 12.5 MG tablet TAKE 1 TABLET (12.5 MG TOTAL) BY MOUTH DAILY. 30 tablet 6  . hyoscyamine (LEVSIN SL) 0.125 MG SL tablet PLACE 1 TABLET (0.125 MG TOTAL) UNDER THE TONGUE EVERY 4 (FOUR) HOURS AS NEEDED. 30 tablet 1  . methocarbamol (ROBAXIN) 500 MG tablet Take 1 tablet (500 mg total) by mouth every 6 (six) hours as needed for muscle spasms. 60 tablet 0  . Multiple Vitamins-Minerals (ANTIOXIDANT VITAMINS) TABS Take 1 tablet by mouth daily. Life Guard    . mupirocin ointment (BACTROBAN) 2 % Apply to b/l nares qhs x 7 days then as needed 22 g 1  . naproxen sodium (ANAPROX) 220 MG tablet  Take 220 mg by mouth 2 (two) times daily with a meal.    . omeprazole (PRILOSEC) 40 MG capsule Take 1 capsule (40 mg total) by mouth daily. 90 capsule 2  . pimecrolimus (ELIDEL) 1 % cream Apply 1 application topically daily. (Patient taking differently: Apply 1 application topically daily as needed. ) 60 g 5  . Red Yeast Rice Extract 600 MG TABS Take 1 tablet by mouth daily.     . enalapril (VASOTEC) 10 MG tablet Take 1 tablet (10 mg total) by mouth daily. 90 tablet 1  . aspirin 81 MG chewable tablet Chew 1 tablet (81 mg total) by mouth 2 (two) times daily. 30 tablet 0   No facility-administered medications prior to visit.     Allergies  Allergen Reactions  . No Known Allergies     Review of Systems  Constitutional: Negative for fever and malaise/fatigue.  HENT: Positive for tinnitus. Negative for congestion.   Eyes: Negative for blurred vision.  Respiratory: Negative for cough and shortness of breath.   Cardiovascular: Negative for chest pain, palpitations, leg swelling and PND.  Gastrointestinal: Positive for abdominal pain and heartburn. Negative for vomiting.  Musculoskeletal: Positive for joint pain. Negative for back pain.  Skin: Negative for rash.  Neurological: Negative for loss of consciousness and headaches.       Objective:    Physical Exam  Constitutional: He is oriented to person, place, and time. He appears well-developed and well-nourished. No distress.  HENT:  Head: Normocephalic and atraumatic.  Eyes: Conjunctivae are normal.  Neck: Normal range of motion. No thyromegaly present.  Cardiovascular: Normal rate and regular rhythm.   Pulmonary/Chest: Effort normal and breath sounds normal. He has no wheezes.  Abdominal: Soft. Bowel sounds are normal. There is no tenderness.  Musculoskeletal: Normal range of motion. He exhibits no edema or deformity.  Neurological: He is alert and oriented to person, place, and time.  Skin: Skin is warm and dry. He is not  diaphoretic.  Psychiatric: He has a normal mood and affect.    BP 118/72 (BP Location: Left Arm, Patient Position: Sitting, Cuff Size: Normal)   Pulse 72   Temp 98.1 F (36.7 C) (Oral)   Resp 18   Wt 187 lb 9.6 oz (85.1 kg)   SpO2 98%   BMI 30.28 kg/m  Wt Readings from Last 3 Encounters:  10/12/16 187 lb 9.6 oz (85.1 kg)  04/08/16 186 lb 2 oz (84.4  kg)  03/16/16 182 lb (82.6 kg)   BP Readings from Last 3 Encounters:  10/12/16 118/72  04/08/16 120/72  03/17/16 120/61     Immunization History  Administered Date(s) Administered  . Influenza,inj,Quad PF,36+ Mos 03/31/2015, 03/17/2016  . Influenza-Unspecified 03/31/2015  . Tdap 04/15/2008  . Zoster 12/03/2015    Health Maintenance  Topic Date Due  . Hepatitis C Screening  [birth year]  . HIV Screening  12/26/1966  . INFLUENZA VACCINE  11/10/2016  . TETANUS/TDAP  04/15/2018  . COLONOSCOPY  01/31/2023    Lab Results  Component Value Date   WBC 8.6 10/05/2016   HGB 14.6 10/05/2016   HCT 43.0 10/05/2016   PLT 314.0 10/05/2016   GLUCOSE 97 10/05/2016   CHOL 186 10/05/2016   TRIG 91.0 10/05/2016   HDL 57.40 10/05/2016   LDLCALC 110 (H) 10/05/2016   ALT 18 10/05/2016   AST 18 10/05/2016   NA 132 (L) 10/05/2016   K 4.7 10/05/2016   CL 96 10/05/2016   CREATININE 0.84 10/05/2016   BUN 11 10/05/2016   CO2 27 10/05/2016   TSH 0.76 10/05/2016   PSA 3.27 10/02/2015   HGBA1C 5.7 10/05/2016    Lab Results  Component Value Date   TSH 0.76 10/05/2016   Lab Results  Component Value Date   WBC 8.6 10/05/2016   HGB 14.6 10/05/2016   HCT 43.0 10/05/2016   MCV 93.2 10/05/2016   PLT 314.0 10/05/2016   Lab Results  Component Value Date   NA 132 (L) 10/05/2016   K 4.7 10/05/2016   CO2 27 10/05/2016   GLUCOSE 97 10/05/2016   BUN 11 10/05/2016   CREATININE 0.84 10/05/2016   BILITOT 0.6 10/05/2016   ALKPHOS 53 10/05/2016   AST 18 10/05/2016   ALT 18 10/05/2016   PROT 6.6 10/05/2016   ALBUMIN 4.4 10/05/2016    CALCIUM 9.6 10/05/2016   ANIONGAP 9 03/17/2016   GFR 97.54 10/05/2016   Lab Results  Component Value Date   CHOL 186 10/05/2016   Lab Results  Component Value Date   HDL 57.40 10/05/2016   Lab Results  Component Value Date   LDLCALC 110 (H) 10/05/2016   Lab Results  Component Value Date   TRIG 91.0 10/05/2016   Lab Results  Component Value Date   CHOLHDL 3 10/05/2016   Lab Results  Component Value Date   HGBA1C 5.7 10/05/2016         Assessment & Plan:   Problem List Items Addressed This Visit    HTN (hypertension)    Well controlled, no changes to meds. Encouraged heart healthy diet such as the DASH diet and exercise as tolerated.       Relevant Orders   Comprehensive metabolic panel   CBC   Comprehensive metabolic panel   TSH   Annual physical exam    Patient encouraged to maintain heart healthy diet, regular exercise, adequate sleep. Consider daily probiotics. Take medications as prescribed      Hyperlipidemia    Encouraged heart healthy diet, increase exercise, avoid trans fats, consider a krill oil cap daily      Relevant Orders   Lipid panel   Overweight    Encouraged DASH diet, decrease po intake and increase exercise as tolerated. Needs 7-8 hours of sleep nightly. Avoid trans fats, eat small, frequent meals every 4-5 hours with lean proteins, complex carbs and healthy fats. Minimize simple carbs, consider bariatric referral      Esophageal stricture  Had to have a recent stretching a t [facility] again this was the first time it was needed since his last surgery in 2015      Hyperglycemia    hgba1c acceptable, minimize simple carbs. Increase exercise as tolerated.       Relevant Orders   Hemoglobin A1c   Right hip pain    Struggling with daily hip pain has an appointment later this month with his orthopaedist Dr Rush Farmer to discuss possible       Colon polyp    Last colonoscopy 2014 recall 2024 if appropriate      Tinnitus    Other  Visit Diagnoses    Preventative health care    -  Primary   Relevant Orders   PSA      I have discontinued Mr. Crotty aspirin. I am also having him start on acetic acid-hydrocortisone. Additionally, I am having him maintain his Red Yeast Rice Extract, pimecrolimus, omeprazole, ANTIOXIDANT VITAMINS, methocarbamol, naproxen sodium, mupirocin ointment, enalapril, hydrochlorothiazide, etodolac, and hyoscyamine.  Meds ordered this encounter  Medications  . acetic acid-hydrocortisone (VOSOL-HC) OTIC solution    Sig: Place 3 drops into both ears 3 (three) times daily.    Dispense:  10 mL    Refill:  0    CMA served as scribe during this visit. History, Physical and Plan performed by medical provider. Documentation and orders reviewed and attested to.  Penni Homans, MD

## 2016-10-12 NOTE — Assessment & Plan Note (Signed)
Last colonoscopy 2014 recall 2024 if appropriate

## 2016-10-12 NOTE — Patient Instructions (Signed)
64 oz  Of water daily Cut back on fluids by 10 oz daily Preventive Care 40-64 Years, Male Preventive care refers to lifestyle choices and visits with your health care provider that can promote health and wellness. What does preventive care include?  A yearly physical exam. This is also called an annual well check.  Dental exams once or twice a year.  Routine eye exams. Ask your health care provider how often you should have your eyes checked.  Personal lifestyle choices, including: ? Daily care of your teeth and gums. ? Regular physical activity. ? Eating a healthy diet. ? Avoiding tobacco and drug use. ? Limiting alcohol use. ? Practicing safe sex. ? Taking low-dose aspirin every day starting at age 5. What happens during an annual well check? The services and screenings done by your health care provider during your annual well check will depend on your age, overall health, lifestyle risk factors, and family history of disease. Counseling Your health care provider may ask you questions about your:  Alcohol use.  Tobacco use.  Drug use.  Emotional well-being.  Home and relationship well-being.  Sexual activity.  Eating habits.  Work and work Statistician.  Screening You may have the following tests or measurements:  Height, weight, and BMI.  Blood pressure.  Lipid and cholesterol levels. These may be checked every 5 years, or more frequently if you are over 37 years old.  Skin check.  Lung cancer screening. You may have this screening every year starting at age 66 if you have a 30-pack-year history of smoking and currently smoke or have quit within the past 15 years.  Fecal occult blood test (FOBT) of the stool. You may have this test every year starting at age 65.  Flexible sigmoidoscopy or colonoscopy. You may have a sigmoidoscopy every 5 years or a colonoscopy every 10 years starting at age 77.  Prostate cancer screening. Recommendations will vary  depending on your family history and other risks.  Hepatitis C blood test.  Hepatitis B blood test.  Sexually transmitted disease (STD) testing.  Diabetes screening. This is done by checking your blood sugar (glucose) after you have not eaten for a while (fasting). You may have this done every 1-3 years.  Discuss your test results, treatment options, and if necessary, the need for more tests with your health care provider. Vaccines Your health care provider may recommend certain vaccines, such as:  Influenza vaccine. This is recommended every year.  Tetanus, diphtheria, and acellular pertussis (Tdap, Td) vaccine. You may need a Td booster every 10 years.  Varicella vaccine. You may need this if you have not been vaccinated.  Zoster vaccine. You may need this after age 2.  Measles, mumps, and rubella (MMR) vaccine. You may need at least one dose of MMR if you were born in 1957 or later. You may also need a second dose.  Pneumococcal 13-valent conjugate (PCV13) vaccine. You may need this if you have certain conditions and have not been vaccinated.  Pneumococcal polysaccharide (PPSV23) vaccine. You may need one or two doses if you smoke cigarettes or if you have certain conditions.  Meningococcal vaccine. You may need this if you have certain conditions.  Hepatitis A vaccine. You may need this if you have certain conditions or if you travel or work in places where you may be exposed to hepatitis A.  Hepatitis B vaccine. You may need this if you have certain conditions or if you travel or work in places where  you may be exposed to hepatitis B.  Haemophilus influenzae type b (Hib) vaccine. You may need this if you have certain risk factors.  Talk to your health care provider about which screenings and vaccines you need and how often you need them. This information is not intended to replace advice given to you by your health care provider. Make sure you discuss any questions you have  with your health care provider. Document Released: 04/25/2015 Document Revised: 12/17/2015 Document Reviewed: 01/28/2015 Elsevier Interactive Patient Education  2017 [employer].

## 2016-10-12 NOTE — Assessment & Plan Note (Signed)
hgba1c acceptable, minimize simple carbs. Increase exercise as tolerated.  

## 2016-10-13 ENCOUNTER — Encounter: Payer: Self-pay | Admitting: Family Medicine

## 2016-10-13 NOTE — Assessment & Plan Note (Signed)
Had to have a recent stretching a t [facility] again this was the first time it was needed since his last surgery in 2015

## 2016-10-13 NOTE — Assessment & Plan Note (Signed)
Encouraged heart healthy diet, increase exercise, avoid trans fats, consider a krill oil cap daily 

## 2016-10-13 NOTE — Assessment & Plan Note (Signed)
Patient encouraged to maintain heart healthy diet, regular exercise, adequate sleep. Consider daily probiotics. Take medications as prescribed 

## 2016-10-13 NOTE — Assessment & Plan Note (Signed)
Struggling with daily hip pain has an appointment later this month with his orthopaedist Dr Rush Farmer to discuss possible

## 2016-10-13 NOTE — Assessment & Plan Note (Signed)
Encouraged DASH diet, decrease po intake and increase exercise as tolerated. Needs 7-8 hours of sleep nightly. Avoid trans fats, eat small, frequent meals every 4-5 hours with lean proteins, complex carbs and healthy fats. Minimize simple carbs, consider bariatric referral 

## 2016-10-23 ENCOUNTER — Other Ambulatory Visit: Payer: Self-pay | Admitting: Family Medicine

## 2016-10-23 DIAGNOSIS — K219 Gastro-esophageal reflux disease without esophagitis: Secondary | ICD-10-CM

## 2016-10-23 DIAGNOSIS — R739 Hyperglycemia, unspecified: Secondary | ICD-10-CM

## 2016-10-23 DIAGNOSIS — I10 Essential (primary) hypertension: Secondary | ICD-10-CM

## 2016-10-23 DIAGNOSIS — E781 Pure hyperglyceridemia: Secondary | ICD-10-CM

## 2016-10-25 ENCOUNTER — Ambulatory Visit (INDEPENDENT_AMBULATORY_CARE_PROVIDER_SITE_OTHER): Payer: Managed Care, Other (non HMO)

## 2016-10-25 ENCOUNTER — Ambulatory Visit (INDEPENDENT_AMBULATORY_CARE_PROVIDER_SITE_OTHER): Payer: Managed Care, Other (non HMO) | Admitting: Orthopaedic Surgery

## 2016-10-25 DIAGNOSIS — Z96642 Presence of left artificial hip joint: Secondary | ICD-10-CM

## 2016-10-25 DIAGNOSIS — M25552 Pain in left hip: Secondary | ICD-10-CM

## 2016-10-25 DIAGNOSIS — M1611 Unilateral primary osteoarthritis, right hip: Secondary | ICD-10-CM

## 2016-10-25 MED ORDER — METHYLPREDNISOLONE 4 MG PO TABS: ORAL_TABLET | ORAL | 0 refills | Status: DC

## 2016-10-25 NOTE — Progress Notes (Signed)
The patient is well-known to me. He is a 66 year old who several months out from a left total hip arthroplasty through direct anterior approach. He says that hip is done wonderful. He's been having right hip pain as well. The right hip pain is been slowly getting worse.  On examination of his left operative hip he has fluid range of motion without any difficulty at all. He said he can get a shoe on and off easier than before and is very pleased with the status of his left hip. His right hip has significant pain with internal/external rotation and stiffness as well.  An AP pelvis shows severe end-stage arthritis of the right hip. There is a plate loss of joint space with para-articular osteophytes and cystic changes on both the femoral head and acetabular. They're sclerotic changes in severe joint space narrowing with periarticular osteophytes.  At this point he wants to consider a right total hip arthroplasty toward in the year. He's already tried and failed all forms of conservative treatment. Anti-inflammatories are causing an increase his blood sugars. I will put him on a six-day steroid taper to temporize things. He will get with our surgery scheduler at all with his wife we will set him up for his right total hip and appropriate time. At this point it is medically necessary based on his clinical exam and radiographically findings. Again he is tried and failed all forms conservative treatment. His left hip is doing exceptionally well. If we do set him up for the surgery would not need a detailed office visit and I can always answer questions over the thigh with him. We would set him up for preoperative labs accordingly.

## 2016-12-10 ENCOUNTER — Other Ambulatory Visit: Payer: Self-pay | Admitting: Family Medicine

## 2016-12-10 DIAGNOSIS — K222 Esophageal obstruction: Secondary | ICD-10-CM

## 2016-12-10 DIAGNOSIS — K2100 Gastro-esophageal reflux disease with esophagitis, without bleeding: Secondary | ICD-10-CM

## 2016-12-10 DIAGNOSIS — K220 Achalasia of cardia: Secondary | ICD-10-CM

## 2016-12-10 DIAGNOSIS — R1310 Dysphagia, unspecified: Secondary | ICD-10-CM

## 2017-01-07 ENCOUNTER — Other Ambulatory Visit: Payer: Self-pay | Admitting: Family Medicine

## 2017-01-07 DIAGNOSIS — K219 Gastro-esophageal reflux disease without esophagitis: Secondary | ICD-10-CM

## 2017-01-07 DIAGNOSIS — E781 Pure hyperglyceridemia: Secondary | ICD-10-CM

## 2017-01-07 DIAGNOSIS — R739 Hyperglycemia, unspecified: Secondary | ICD-10-CM

## 2017-01-07 DIAGNOSIS — I10 Essential (primary) hypertension: Secondary | ICD-10-CM

## 2017-01-11 ENCOUNTER — Other Ambulatory Visit: Payer: Self-pay | Admitting: Family Medicine

## 2017-02-09 ENCOUNTER — Other Ambulatory Visit: Payer: Self-pay | Admitting: Family Medicine

## 2017-02-14 ENCOUNTER — Other Ambulatory Visit: Payer: Self-pay | Admitting: Family Medicine

## 2017-02-14 DIAGNOSIS — I10 Essential (primary) hypertension: Secondary | ICD-10-CM

## 2017-02-14 DIAGNOSIS — K219 Gastro-esophageal reflux disease without esophagitis: Secondary | ICD-10-CM

## 2017-02-14 DIAGNOSIS — R739 Hyperglycemia, unspecified: Secondary | ICD-10-CM

## 2017-02-14 DIAGNOSIS — E781 Pure hyperglyceridemia: Secondary | ICD-10-CM

## 2017-03-08 ENCOUNTER — Other Ambulatory Visit: Payer: Self-pay

## 2017-03-08 DIAGNOSIS — R1310 Dysphagia, unspecified: Secondary | ICD-10-CM

## 2017-03-08 DIAGNOSIS — K222 Esophageal obstruction: Secondary | ICD-10-CM

## 2017-03-08 DIAGNOSIS — K220 Achalasia of cardia: Secondary | ICD-10-CM

## 2017-03-08 DIAGNOSIS — K2100 Gastro-esophageal reflux disease with esophagitis, without bleeding: Secondary | ICD-10-CM

## 2017-03-08 MED ORDER — OMEPRAZOLE 40 MG PO CPDR: 40.0000 mg | DELAYED_RELEASE_CAPSULE | Freq: Every day | ORAL | 1 refills | Status: DC

## 2017-03-14 ENCOUNTER — Other Ambulatory Visit: Payer: Self-pay | Admitting: Family Medicine

## 2017-03-14 DIAGNOSIS — R739 Hyperglycemia, unspecified: Secondary | ICD-10-CM

## 2017-03-14 DIAGNOSIS — K219 Gastro-esophageal reflux disease without esophagitis: Secondary | ICD-10-CM

## 2017-03-14 DIAGNOSIS — I10 Essential (primary) hypertension: Secondary | ICD-10-CM

## 2017-03-14 DIAGNOSIS — E781 Pure hyperglyceridemia: Secondary | ICD-10-CM

## 2017-04-02 ENCOUNTER — Other Ambulatory Visit: Payer: Self-pay | Admitting: Family Medicine

## 2017-04-02 DIAGNOSIS — K219 Gastro-esophageal reflux disease without esophagitis: Secondary | ICD-10-CM

## 2017-04-02 DIAGNOSIS — R739 Hyperglycemia, unspecified: Secondary | ICD-10-CM

## 2017-04-02 DIAGNOSIS — E781 Pure hyperglyceridemia: Secondary | ICD-10-CM

## 2017-04-02 DIAGNOSIS — I10 Essential (primary) hypertension: Secondary | ICD-10-CM

## 2017-04-19 ENCOUNTER — Other Ambulatory Visit (INDEPENDENT_AMBULATORY_CARE_PROVIDER_SITE_OTHER): Payer: Self-pay | Admitting: Physician Assistant

## 2017-04-26 ENCOUNTER — Encounter (HOSPITAL_COMMUNITY)
Admission: RE | Admit: 2017-04-26 | Discharge: 2017-04-26 | Disposition: A | Discharge status: Home / Self Care | Payer: Managed Care, Other (non HMO) | Source: Ambulatory Visit | Attending: Orthopaedic Surgery | Admitting: Orthopaedic Surgery

## 2017-04-26 ENCOUNTER — Other Ambulatory Visit: Payer: Self-pay

## 2017-04-26 ENCOUNTER — Encounter (HOSPITAL_COMMUNITY): Payer: Self-pay

## 2017-04-26 DIAGNOSIS — Z01812 Encounter for preprocedural laboratory examination: Secondary | ICD-10-CM | POA: Diagnosis present

## 2017-04-26 DIAGNOSIS — I10 Essential (primary) hypertension: Secondary | ICD-10-CM | POA: Diagnosis not present

## 2017-04-26 DIAGNOSIS — Z01810 Encounter for preprocedural cardiovascular examination: Secondary | ICD-10-CM | POA: Diagnosis present

## 2017-04-26 DIAGNOSIS — M1611 Unilateral primary osteoarthritis, right hip: Secondary | ICD-10-CM | POA: Insufficient documentation

## 2017-04-26 HISTORY — DX: Presence of spectacles and contact lenses: Z97.3

## 2017-04-26 HISTORY — DX: Osteoarthritis of hip, unspecified: M16.9

## 2017-04-26 LAB — CBC
HEMATOCRIT: 43 % (ref 39.0–52.0)
HEMOGLOBIN: 14.8 g/dL (ref 13.0–17.0)
MCH: 32 pg (ref 26.0–34.0)
MCHC: 34.4 g/dL (ref 30.0–36.0)
MCV: 92.9 fL (ref 78.0–100.0)
Platelets: 289 10*3/uL (ref 150–400)
RBC: 4.63 MIL/uL (ref 4.22–5.81)
RDW: 12.3 % (ref 11.5–15.5)
WBC: 7.7 10*3/uL (ref 4.0–10.5)

## 2017-04-26 LAB — BASIC METABOLIC PANEL
ANION GAP: 11 (ref 5–15)
BUN: 11 mg/dL (ref 6–20)
CO2: 25 mmol/L (ref 22–32)
Calcium: 9.3 mg/dL (ref 8.9–10.3)
Chloride: 92 mmol/L — ABNORMAL LOW (ref 101–111)
Creatinine, Ser: 0.84 mg/dL (ref 0.61–1.24)
GFR calc Af Amer: >60 mL/min (ref >60)
Glucose, Bld: 109 mg/dL — ABNORMAL HIGH (ref 65–99)
POTASSIUM: 3.9 mmol/L (ref 3.5–5.1)
SODIUM: 128 mmol/L — AB (ref 135–145)

## 2017-04-26 LAB — SURGICAL PCR SCREEN
MRSA, PCR: NEGATIVE
STAPHYLOCOCCUS AUREUS: NEGATIVE

## 2017-04-26 NOTE — Progress Notes (Signed)
   04/26/17 1421  OBSTRUCTIVE SLEEP APNEA  Have you ever been diagnosed with sleep apnea through a sleep study? No  Do you snore loudly (loud enough to be heard through closed doors)?  1  Do you often feel tired, fatigued, or sleepy during the daytime (such as falling asleep during driving or talking to someone)? 0  Has anyone observed you stop breathing during your sleep? 0  Do you have, or are you being treated for high blood pressure? 1  BMI more than 35 kg/m2? 0  Age > 50 (1-yes) 1  Neck circumference greater than:Male 16 inches or larger, Male 17inches or larger? 1  Male Gender (Yes=1) 1  Obstructive Sleep Apnea Score 5

## 2017-04-26 NOTE — Progress Notes (Signed)
Pt denies SOB, chest pain, and being under the care of a cardiologist. Pt denies having an echo and cardiac cath. Pt denies having an EKG and chest x ray within the last year. Pt denies recent labs. Pt chart forwarded to anesthesia to review abnormal lab ( sodium 128).

## 2017-04-26 NOTE — Pre-Procedure Instructions (Signed)
    Gabriel [name]  04/26/2017      [id number] - [place], [place] - [address] [place] [address] Phone: [phone] Fax: [phone]    Your procedure is scheduled on Tuesday, May 03, 2017  Report to [facility] Admitting at 10:15 A.M.  Call this number if you have problems the morning of surgery:  [phone]   Remember:  Do not eat food or drink liquids after midnight Monday, May 02, 2017  Take these medicines the morning of surgery with A SIP OF WATER : omeprazole (PRILOSEC), if needed: hyoscyamine (LEVSIN SL),  Stop taking Aspirin, vitamins, fish oil, Red Yeast Rice, Turmeric and herbal medications. Do not take any NSAIDs ie: Ibuprofen, Advil, Naproxen (Ananprox/Aleve), etodolac (LODINE), Motrin, BC and Goody Powder; stop now.  Do not wear jewelry, make-up or nail polish.  Do not wear lotions, powders, or perfumes, or deodorant.  Do not shave 48 hours prior to surgery.  Men may shave face and neck.  Do not bring valuables to the hospital.  [facility] is not responsible for any belongings or valuables.  Contacts, dentures or bridgework may not be worn into surgery.  Leave your suitcase in the car.  After surgery it may be brought to your room. Patients discharged the day of surgery will not be allowed to drive home.  Special instructions: Shower the night before surgery and the morning of surgery with [facility]. Please read over the following fact sheets that you were given. Pain Booklet, Coughing and Deep Breathing, MRSA Information and Surgical Site Infection Prevention

## 2017-04-27 NOTE — Progress Notes (Addendum)
Anesthesia Chart Review:  Pt is a 67 year old male scheduled for R total hip arthroplasty anterior approach on 05/03/2017 with Jean Rosenthal, MD  - PCP is Penni Homans, MD  PMH includes:  HTN, hyperlipidemia, GERD. Former smoker. BMI 31.5. S/p L THA 03/16/16.   Medications include: Enalapril, HCTZ, Prilosec  BP (!) 145/82   Pulse 86   Temp 36.7 C   Resp 20   Ht 5\' 6"  (1.676 m)   Wt 195 lb 3.2 oz (88.5 kg)   SpO2 98%   BMI 31.51 kg/m   Preoperative labs reviewed.  Na 128, Cl 92. Usual Na ranges 131-134  EKG 04/26/17: NSR  Nuclear stress test 09/29/11:  - Normal stress nuclear study. - LV Ejection Fraction: 61%.  LV Wall Motion:  NL LV Function; NL Wall Motion  I reached out to PCP for input regarding hyponatremia but have not gotten a response.   Reviewed labs with Dr. Kalman Shan. Will recheck Na day of surgery. If no changes, I anticipate pt can proceed with surgery as scheduled.   Willeen Cass, FNP-BC [facility]/Anesthesiology Phone: [phone] 04/29/2017 2:44 PM

## 2017-05-02 MED ORDER — TRANEXAMIC ACID 1000 MG/10ML IV SOLN
1000.0000 mg | INTRAVENOUS | Status: AC
  Administered 2017-05-03: 1000 mg via INTRAVENOUS
  Filled 2017-05-02: qty 1100

## 2017-05-03 ENCOUNTER — Inpatient Hospital Stay (HOSPITAL_COMMUNITY): Payer: Managed Care, Other (non HMO) | Admitting: Emergency Medicine

## 2017-05-03 ENCOUNTER — Encounter (HOSPITAL_COMMUNITY)
Admission: RE | Disposition: A | Discharge status: Home Health | Payer: Self-pay | Source: Ambulatory Visit | Attending: Orthopaedic Surgery

## 2017-05-03 ENCOUNTER — Inpatient Hospital Stay (HOSPITAL_COMMUNITY)
Admission: RE | Admit: 2017-05-03 | Discharge: 2017-05-05 | DRG: 470 | Disposition: A | Discharge status: Home Health | Payer: Managed Care, Other (non HMO) | Source: Ambulatory Visit | Attending: Orthopaedic Surgery | Admitting: Orthopaedic Surgery

## 2017-05-03 ENCOUNTER — Inpatient Hospital Stay (HOSPITAL_COMMUNITY): Payer: Managed Care, Other (non HMO)

## 2017-05-03 DIAGNOSIS — I10 Essential (primary) hypertension: Secondary | ICD-10-CM | POA: Diagnosis present

## 2017-05-03 DIAGNOSIS — Z96642 Presence of left artificial hip joint: Secondary | ICD-10-CM | POA: Diagnosis present

## 2017-05-03 DIAGNOSIS — M1611 Unilateral primary osteoarthritis, right hip: Principal | ICD-10-CM

## 2017-05-03 DIAGNOSIS — Z9049 Acquired absence of other specified parts of digestive tract: Secondary | ICD-10-CM

## 2017-05-03 DIAGNOSIS — Z87891 Personal history of nicotine dependence: Secondary | ICD-10-CM | POA: Diagnosis not present

## 2017-05-03 DIAGNOSIS — K219 Gastro-esophageal reflux disease without esophagitis: Secondary | ICD-10-CM | POA: Diagnosis present

## 2017-05-03 DIAGNOSIS — L309 Dermatitis, unspecified: Secondary | ICD-10-CM | POA: Diagnosis present

## 2017-05-03 DIAGNOSIS — M25451 Effusion, right hip: Secondary | ICD-10-CM | POA: Diagnosis present

## 2017-05-03 DIAGNOSIS — Z96641 Presence of right artificial hip joint: Secondary | ICD-10-CM

## 2017-05-03 DIAGNOSIS — Z8739 Personal history of other diseases of the musculoskeletal system and connective tissue: Secondary | ICD-10-CM

## 2017-05-03 DIAGNOSIS — M1711 Unilateral primary osteoarthritis, right knee: Secondary | ICD-10-CM

## 2017-05-03 DIAGNOSIS — Z419 Encounter for procedure for purposes other than remedying health state, unspecified: Secondary | ICD-10-CM

## 2017-05-03 DIAGNOSIS — Z8261 Family history of arthritis: Secondary | ICD-10-CM

## 2017-05-03 DIAGNOSIS — E785 Hyperlipidemia, unspecified: Secondary | ICD-10-CM | POA: Diagnosis present

## 2017-05-03 HISTORY — PX: TOTAL HIP ARTHROPLASTY: SHX124

## 2017-05-03 LAB — POCT I-STAT 4, (NA,K, GLUC, HGB,HCT)
Glucose, Bld: 113 mg/dL — ABNORMAL HIGH (ref 65–99)
HEMATOCRIT: 44 % (ref 39.0–52.0)
HEMOGLOBIN: 15 g/dL (ref 13.0–17.0)
POTASSIUM: 3.9 mmol/L (ref 3.5–5.1)
SODIUM: 135 mmol/L (ref 135–145)

## 2017-05-03 SURGERY — ARTHROPLASTY, HIP, TOTAL, ANTERIOR APPROACH
Anesthesia: Spinal | Site: Hip | Laterality: Right

## 2017-05-03 MED ORDER — METHOCARBAMOL 500 MG PO TABS
500.0000 mg | ORAL_TABLET | Freq: Four times a day (QID) | ORAL | Status: DC | PRN
  Administered 2017-05-03 – 2017-05-05 (×4): 500 mg via ORAL
  Filled 2017-05-03 (×3): qty 1

## 2017-05-03 MED ORDER — CEFAZOLIN SODIUM-DEXTROSE 2-4 GM/100ML-% IV SOLN
2.0000 g | INTRAVENOUS | Status: AC
  Administered 2017-05-03: 2 g via INTRAVENOUS

## 2017-05-03 MED ORDER — BUPIVACAINE IN DEXTROSE 0.75-8.25 % IT SOLN
INTRATHECAL | Status: DC | PRN
  Administered 2017-05-03: 2 mL via INTRATHECAL

## 2017-05-03 MED ORDER — DIPHENHYDRAMINE HCL 12.5 MG/5ML PO ELIX: 12.5000 mg | ORAL_SOLUTION | ORAL | Status: DC | PRN

## 2017-05-03 MED ORDER — HYDROMORPHONE HCL 1 MG/ML IJ SOLN
INTRAMUSCULAR | Status: AC
  Administered 2017-05-03: 0.5 mg via INTRAVENOUS
  Filled 2017-05-03: qty 1

## 2017-05-03 MED ORDER — MIDAZOLAM HCL 5 MG/5ML IJ SOLN
INTRAMUSCULAR | Status: DC | PRN
  Administered 2017-05-03: 2 mg via INTRAVENOUS

## 2017-05-03 MED ORDER — CEFAZOLIN SODIUM-DEXTROSE 2-4 GM/100ML-% IV SOLN
INTRAVENOUS | Status: AC
  Filled 2017-05-03: qty 100

## 2017-05-03 MED ORDER — OXYCODONE HCL 5 MG PO TABS
10.0000 mg | ORAL_TABLET | ORAL | Status: DC | PRN
  Administered 2017-05-03 – 2017-05-04 (×2): 10 mg via ORAL
  Filled 2017-05-03 (×2): qty 2

## 2017-05-03 MED ORDER — HYDROCODONE-ACETAMINOPHEN 5-325 MG PO TABS
1.0000 | ORAL_TABLET | ORAL | Status: DC | PRN
  Administered 2017-05-04: 2 via ORAL
  Administered 2017-05-04: 1 via ORAL
  Administered 2017-05-05: 2 via ORAL
  Filled 2017-05-03 (×3): qty 2

## 2017-05-03 MED ORDER — LIDOCAINE 2% (20 MG/ML) 5 ML SYRINGE
INTRAMUSCULAR | Status: AC
  Filled 2017-05-03: qty 5

## 2017-05-03 MED ORDER — ONDANSETRON HCL 4 MG/2ML IJ SOLN
INTRAMUSCULAR | Status: DC | PRN
  Administered 2017-05-03: 4 mg via INTRAVENOUS

## 2017-05-03 MED ORDER — METHOCARBAMOL 500 MG PO TABS
ORAL_TABLET | ORAL | Status: AC
  Filled 2017-05-03: qty 1

## 2017-05-03 MED ORDER — CEFAZOLIN SODIUM-DEXTROSE 1-4 GM/50ML-% IV SOLN
INTRAVENOUS | Status: AC
  Administered 2017-05-03: 1 g via INTRAVENOUS
  Filled 2017-05-03: qty 50

## 2017-05-03 MED ORDER — PROMETHAZINE HCL 25 MG/ML IJ SOLN: 6.2500 mg | INTRAMUSCULAR | Status: DC | PRN

## 2017-05-03 MED ORDER — ACETAMINOPHEN 325 MG PO TABS: 650.0000 mg | ORAL_TABLET | ORAL | Status: DC | PRN

## 2017-05-03 MED ORDER — MIDAZOLAM HCL 2 MG/2ML IJ SOLN
INTRAMUSCULAR | Status: AC
  Filled 2017-05-03: qty 2

## 2017-05-03 MED ORDER — HYDROMORPHONE HCL 1 MG/ML IJ SOLN
0.2500 mg | INTRAMUSCULAR | Status: DC | PRN
  Administered 2017-05-03 (×2): 0.5 mg via INTRAVENOUS

## 2017-05-03 MED ORDER — PHENOL 1.4 % MT LIQD: 1.0000 | OROMUCOSAL | Status: DC | PRN

## 2017-05-03 MED ORDER — METOCLOPRAMIDE HCL 5 MG/ML IJ SOLN: 5.0000 mg | Freq: Three times a day (TID) | INTRAMUSCULAR | Status: DC | PRN

## 2017-05-03 MED ORDER — SODIUM CHLORIDE 0.9 % IR SOLN
Status: DC | PRN
  Administered 2017-05-03: 3000 mL

## 2017-05-03 MED ORDER — LACTATED RINGERS IV SOLN
INTRAVENOUS | Status: DC
  Administered 2017-05-03 (×2): via INTRAVENOUS

## 2017-05-03 MED ORDER — ADULT MULTIVITAMIN W/MINERALS CH
1.0000 | ORAL_TABLET | Freq: Every day | ORAL | Status: DC
  Administered 2017-05-04: 1 via ORAL
  Filled 2017-05-03 (×3): qty 1

## 2017-05-03 MED ORDER — ONDANSETRON HCL 4 MG/2ML IJ SOLN
INTRAMUSCULAR | Status: AC
  Filled 2017-05-03: qty 2

## 2017-05-03 MED ORDER — PANTOPRAZOLE SODIUM 40 MG PO TBEC
40.0000 mg | DELAYED_RELEASE_TABLET | Freq: Every day | ORAL | Status: DC
  Administered 2017-05-04: 40 mg via ORAL
  Filled 2017-05-03: qty 1

## 2017-05-03 MED ORDER — ONDANSETRON HCL 4 MG/2ML IJ SOLN: 4.0000 mg | Freq: Four times a day (QID) | INTRAMUSCULAR | Status: DC | PRN

## 2017-05-03 MED ORDER — HYDROMORPHONE HCL 1 MG/ML IJ SOLN
1.0000 mg | INTRAMUSCULAR | Status: DC | PRN
  Administered 2017-05-03 – 2017-05-04 (×8): 1 mg via INTRAVENOUS
  Filled 2017-05-03 (×8): qty 1

## 2017-05-03 MED ORDER — ACETAMINOPHEN 650 MG RE SUPP: 650.0000 mg | RECTAL | Status: DC | PRN

## 2017-05-03 MED ORDER — DOCUSATE SODIUM 100 MG PO CAPS
100.0000 mg | ORAL_CAPSULE | Freq: Two times a day (BID) | ORAL | Status: DC
  Administered 2017-05-03 – 2017-05-04 (×3): 100 mg via ORAL
  Filled 2017-05-03 (×3): qty 1

## 2017-05-03 MED ORDER — CHLORHEXIDINE GLUCONATE 4 % EX LIQD: 60.0000 mL | Freq: Once | CUTANEOUS | Status: DC

## 2017-05-03 MED ORDER — PHENYLEPHRINE HCL 10 MG/ML IJ SOLN
INTRAMUSCULAR | Status: DC | PRN
  Administered 2017-05-03: 40 ug via INTRAVENOUS
  Administered 2017-05-03: 80 ug via INTRAVENOUS
  Administered 2017-05-03: 40 ug via INTRAVENOUS
  Administered 2017-05-03 (×2): 80 ug via INTRAVENOUS

## 2017-05-03 MED ORDER — PROPOFOL 10 MG/ML IV BOLUS
INTRAVENOUS | Status: AC
  Filled 2017-05-03: qty 20

## 2017-05-03 MED ORDER — ALUM & MAG HYDROXIDE-SIMETH 200-200-20 MG/5ML PO SUSP: 30.0000 mL | ORAL | Status: DC | PRN

## 2017-05-03 MED ORDER — PROPOFOL 10 MG/ML IV BOLUS
INTRAVENOUS | Status: DC | PRN
  Administered 2017-05-03 (×10): 20 mg via INTRAVENOUS

## 2017-05-03 MED ORDER — 0.9 % SODIUM CHLORIDE (POUR BTL) OPTIME
TOPICAL | Status: DC | PRN
  Administered 2017-05-03: 1000 mL

## 2017-05-03 MED ORDER — FENTANYL CITRATE (PF) 100 MCG/2ML IJ SOLN
INTRAMUSCULAR | Status: DC | PRN
  Administered 2017-05-03 (×2): 25 ug via INTRAVENOUS
  Administered 2017-05-03: 50 ug via INTRAVENOUS

## 2017-05-03 MED ORDER — MENTHOL 3 MG MT LOZG: 1.0000 | LOZENGE | OROMUCOSAL | Status: DC | PRN

## 2017-05-03 MED ORDER — PROPOFOL 500 MG/50ML IV EMUL
INTRAVENOUS | Status: DC | PRN
  Administered 2017-05-03: 25 ug/kg/min via INTRAVENOUS

## 2017-05-03 MED ORDER — METHOCARBAMOL 1000 MG/10ML IJ SOLN
500.0000 mg | Freq: Four times a day (QID) | INTRAVENOUS | Status: DC | PRN
  Filled 2017-05-03: qty 5

## 2017-05-03 MED ORDER — ONDANSETRON HCL 4 MG PO TABS: 4.0000 mg | ORAL_TABLET | Freq: Four times a day (QID) | ORAL | Status: DC | PRN

## 2017-05-03 MED ORDER — ASPIRIN 81 MG PO CHEW
81.0000 mg | CHEWABLE_TABLET | Freq: Two times a day (BID) | ORAL | Status: DC
Start: 2017-05-03 — End: 2017-05-05
  Administered 2017-05-03 – 2017-05-04 (×3): 81 mg via ORAL
  Filled 2017-05-03 (×3): qty 1

## 2017-05-03 MED ORDER — SODIUM CHLORIDE 0.9 % IV SOLN
INTRAVENOUS | Status: DC
  Administered 2017-05-03: 19:00:00 via INTRAVENOUS

## 2017-05-03 MED ORDER — CEFAZOLIN SODIUM-DEXTROSE 1-4 GM/50ML-% IV SOLN
1.0000 g | Freq: Four times a day (QID) | INTRAVENOUS | Status: AC
  Administered 2017-05-03 (×2): 1 g via INTRAVENOUS
  Filled 2017-05-03 (×2): qty 50

## 2017-05-03 MED ORDER — FENTANYL CITRATE (PF) 250 MCG/5ML IJ SOLN
INTRAMUSCULAR | Status: AC
  Filled 2017-05-03: qty 5

## 2017-05-03 MED ORDER — HYDROCHLOROTHIAZIDE 25 MG PO TABS
12.5000 mg | ORAL_TABLET | Freq: Every day | ORAL | Status: DC
  Administered 2017-05-04: 12.5 mg via ORAL
  Filled 2017-05-03: qty 1

## 2017-05-03 MED ORDER — METOCLOPRAMIDE HCL 5 MG PO TABS: 5.0000 mg | ORAL_TABLET | Freq: Three times a day (TID) | ORAL | Status: DC | PRN

## 2017-05-03 MED ORDER — HYDROCHLOROTHIAZIDE 25 MG PO TABS: 12.5000 mg | ORAL_TABLET | Freq: Every day | ORAL | Status: DC

## 2017-05-03 MED ORDER — PHENYLEPHRINE HCL 10 MG/ML IJ SOLN
INTRAVENOUS | Status: DC | PRN
  Administered 2017-05-03: 20 ug/min via INTRAVENOUS

## 2017-05-03 MED ORDER — PROPOFOL 1000 MG/100ML IV EMUL
INTRAVENOUS | Status: AC
  Filled 2017-05-03: qty 100

## 2017-05-03 SURGICAL SUPPLY — 59 items
BENZOIN TINCTURE PRP APPL 2/3 (GAUZE/BANDAGES/DRESSINGS) ×3 IMPLANT
BLADE CLIPPER SURG (BLADE) IMPLANT
BLADE SAW SGTL 18X1.27X75 (BLADE) ×2 IMPLANT
BLADE SAW SGTL 18X1.27X75MM (BLADE) ×1
CAPT HIP TOTAL 2 ×3 IMPLANT
CELLS DAT CNTRL 66122 CELL SVR (MISCELLANEOUS) ×1 IMPLANT
CLOSURE WOUND 1/2 X4 (GAUZE/BANDAGES/DRESSINGS) ×2
COVER SURGICAL LIGHT HANDLE (MISCELLANEOUS) ×3 IMPLANT
CUP SECTOR GRIPTON 58MM (Orthopedic Implant) ×3 IMPLANT
DRAPE C-ARM 42X72 X-RAY (DRAPES) ×3 IMPLANT
DRAPE STERI IOBAN 125X83 (DRAPES) ×3 IMPLANT
DRAPE U-SHAPE 47X51 STRL (DRAPES) ×9 IMPLANT
DRSG AQUACEL AG ADV 3.5X10 (GAUZE/BANDAGES/DRESSINGS) ×3 IMPLANT
DURAPREP 26ML APPLICATOR (WOUND CARE) ×3 IMPLANT
ELECT BLADE 4.0 EZ CLEAN MEGAD (MISCELLANEOUS) ×3
ELECT BLADE 6.5 EXT (BLADE) ×3 IMPLANT
ELECT REM PT RETURN 9FT ADLT (ELECTROSURGICAL) ×3
ELECTRODE BLDE 4.0 EZ CLN MEGD (MISCELLANEOUS) ×1 IMPLANT
ELECTRODE REM PT RTRN 9FT ADLT (ELECTROSURGICAL) ×1 IMPLANT
FACESHIELD WRAPAROUND (MASK) ×9 IMPLANT
GLOVE BIO SURGEON STRL SZ 6 (GLOVE) ×3 IMPLANT
GLOVE BIOGEL PI IND STRL 6.5 (GLOVE) ×2 IMPLANT
GLOVE BIOGEL PI IND STRL 7.0 (GLOVE) ×2 IMPLANT
GLOVE BIOGEL PI IND STRL 8 (GLOVE) ×2 IMPLANT
GLOVE BIOGEL PI INDICATOR 6.5 (GLOVE) ×4
GLOVE BIOGEL PI INDICATOR 7.0 (GLOVE) ×4
GLOVE BIOGEL PI INDICATOR 8 (GLOVE) ×4
GLOVE ECLIPSE 8.0 STRL XLNG CF (GLOVE) ×3 IMPLANT
GLOVE ORTHO TXT STRL SZ7.5 (GLOVE) ×6 IMPLANT
GOWN STRL REUS W/ TWL LRG LVL3 (GOWN DISPOSABLE) ×2 IMPLANT
GOWN STRL REUS W/ TWL XL LVL3 (GOWN DISPOSABLE) ×2 IMPLANT
GOWN STRL REUS W/TWL LRG LVL3 (GOWN DISPOSABLE) ×4
GOWN STRL REUS W/TWL XL LVL3 (GOWN DISPOSABLE) ×4
HANDPIECE INTERPULSE COAX TIP (DISPOSABLE) ×2
HEAD CERAMIC 36 PLUS5 (Hips) ×3 IMPLANT
HEAD CERAMIC DELTA 36 PLUS 1.5 (Hips) ×3 IMPLANT
KIT BASIN OR (CUSTOM PROCEDURE TRAY) ×3 IMPLANT
KIT ROOM TURNOVER OR (KITS) ×3 IMPLANT
MANIFOLD NEPTUNE II (INSTRUMENTS) ×3 IMPLANT
NS IRRIG 1000ML POUR BTL (IV SOLUTION) ×3 IMPLANT
PACK TOTAL JOINT (CUSTOM PROCEDURE TRAY) ×3 IMPLANT
PAD ARMBOARD 7.5X6 YLW CONV (MISCELLANEOUS) ×3 IMPLANT
RTRCTR WOUND ALEXIS 18CM MED (MISCELLANEOUS) ×3
SET HNDPC FAN SPRY TIP SCT (DISPOSABLE) ×1 IMPLANT
STAPLER VISISTAT 35W (STAPLE) IMPLANT
STRIP CLOSURE SKIN 1/2X4 (GAUZE/BANDAGES/DRESSINGS) ×4 IMPLANT
SUT ETHIBOND NAB CT1 #1 30IN (SUTURE) ×3 IMPLANT
SUT MNCRL AB 4-0 PS2 18 (SUTURE) IMPLANT
SUT VIC AB 0 CT1 27 (SUTURE) ×2
SUT VIC AB 0 CT1 27XBRD ANBCTR (SUTURE) ×1 IMPLANT
SUT VIC AB 1 CT1 27 (SUTURE) ×2
SUT VIC AB 1 CT1 27XBRD ANBCTR (SUTURE) ×1 IMPLANT
SUT VIC AB 2-0 CT1 27 (SUTURE) ×2
SUT VIC AB 2-0 CT1 TAPERPNT 27 (SUTURE) ×1 IMPLANT
TOWEL OR 17X24 6PK STRL BLUE (TOWEL DISPOSABLE) ×3 IMPLANT
TOWEL OR 17X26 10 PK STRL BLUE (TOWEL DISPOSABLE) ×3 IMPLANT
TRAY CATH 16FR W/PLASTIC CATH (SET/KITS/TRAYS/PACK) ×3 IMPLANT
TRAY FOLEY W/METER SILVER 16FR (SET/KITS/TRAYS/PACK) IMPLANT
WATER STERILE IRR 1000ML POUR (IV SOLUTION) ×6 IMPLANT

## 2017-05-03 NOTE — Brief Op Note (Signed)
05/03/2017  2:04 PM  PATIENT:  Gabriel [name]  67 y.o. male  PRE-OPERATIVE DIAGNOSIS:  Osteoarthritis Right Hip  POST-OPERATIVE DIAGNOSIS:  Osteoarthritis Right Hip  PROCEDURE:  Procedure(s): RIGHT TOTAL HIP ARTHROPLASTY ANTERIOR APPROACH (Right)  SURGEON:  Surgeon(s) and Role:    Mcarthur Rossetti, MD - Primary  PHYSICIAN ASSISTANT: Benita Stabile, PA-C  ANESTHESIA:   spinal  EBL:  350 mL   COUNTS:  YES  DICTATION: .Other Dictation: Dictation Number [id number]  PLAN OF CARE: Admit to inpatient   PATIENT DISPOSITION:  PACU - hemodynamically stable.   Delay start of Pharmacological VTE agent (>24hrs) due to surgical blood loss or risk of bleeding: no

## 2017-05-03 NOTE — Anesthesia Preprocedure Evaluation (Signed)
Anesthesia Evaluation  Patient identified by MRN, date of birth, ID band Patient awake    Reviewed: Allergy & Precautions, NPO status , Patient's Chart, lab work & pertinent test results  Airway Mallampati: II  TM Distance: >3 FB Neck ROM: Full    Dental no notable dental hx.    Pulmonary neg pulmonary ROS, former smoker,    Pulmonary exam normal breath sounds clear to auscultation       Cardiovascular hypertension, Normal cardiovascular exam Rhythm:Regular Rate:Normal     Neuro/Psych negative neurological ROS  negative psych ROS   GI/Hepatic Neg liver ROS, GERD  Medicated,  Endo/Other  negative endocrine ROS  Renal/GU negative Renal ROS  negative genitourinary   Musculoskeletal negative musculoskeletal ROS (+)   Abdominal   Peds negative pediatric ROS (+)  Hematology negative hematology ROS (+)   Anesthesia Other Findings   Reproductive/Obstetrics negative OB ROS                             Anesthesia Physical Anesthesia Plan  ASA: II  Anesthesia Plan: Spinal   Post-op Pain Management:    Induction: Intravenous  PONV Risk Score and Plan:   Airway Management Planned: Simple Face Mask  Additional Equipment:   Intra-op Plan:   Post-operative Plan:   Informed Consent: I have reviewed the patients History and Physical, chart, labs and discussed the procedure including the risks, benefits and alternatives for the proposed anesthesia with the patient or authorized representative who has indicated his/her understanding and acceptance.   Dental advisory given  Plan Discussed with: CRNA and Surgeon  Anesthesia Plan Comments:         Anesthesia Quick Evaluation

## 2017-05-03 NOTE — Anesthesia Procedure Notes (Signed)
Procedure Name: MAC Date/Time: 05/03/2017 12:11 PM Performed by: Renato Shin, CRNA Oxygen Delivery Method: Simple face mask Placement Confirmation: positive ETCO2

## 2017-05-03 NOTE — H&P (Signed)
TOTAL HIP ADMISSION H&P  Patient is admitted for right total hip arthroplasty.  Subjective:  Chief Complaint: right hip pain  HPI: Gabriel [name], 67 y.o. male, has a history of pain and functional disability in the right hip(s) due to arthritis and patient has failed non-surgical conservative treatments for greater than 12 weeks to include NSAID's and/or analgesics, corticosteriod injections, flexibility and strengthening excercises, use of assistive devices and activity modification.  Onset of symptoms was gradual starting 3 years ago with gradually worsening course since that time.The patient noted no past surgery on the right hip(s).  Patient currently rates pain in the right hip at 10 out of 10 with activity. Patient has night pain, worsening of pain with activity and weight bearing, pain that interfers with activities of daily living and pain with passive range of motion. Patient has evidence of subchondral sclerosis, periarticular osteophytes and joint space narrowing by imaging studies. This condition presents safety issues increasing the risk of falls.  There is no current active infection.  Patient Active Problem List   Diagnosis Date Noted  . Unilateral primary osteoarthritis, right hip 05/03/2017  . Tinnitus 10/12/2016  . Unilateral primary osteoarthritis, left hip 03/16/2016  . Status post left hip replacement 03/16/2016  . Skin lesion of left leg 10/07/2015  . Otitis, externa, infective 03/31/2015  . Right hip pain 08/11/2014  . Colon polyp 08/11/2014  . Insomnia 01/09/2014  . Hyperglycemia 01/09/2014  . Eczema 01/09/2014  . Enlarged prostate 01/30/2013  . Esophageal stricture 01/30/2013  . Esophageal ulcer 01/30/2013  . Overweight 11/14/2012  . Esophageal reflux 05/21/2012  . Hyperlipidemia 01/09/2012  . Annual physical exam 09/29/2011  . Epicondylitis elbow, medial 04/20/2011  . HTN (hypertension) 03/27/2011  . Achalasia 03/27/2011   Past Medical History:   Diagnosis Date  . Achalasia   . Arthritis   . Colon cancer screening 08/11/2014   Sees [facility], Dr Carlean Purl   . Colon polyp 08/11/2014   Sees [facility], Dr Carlean Purl    . Eczema 01/09/2014  . Esophageal reflux 05/21/2012  . Hyperlipidemia   . Hypertension   . Insomnia 01/09/2014  . Left hip pain 08/11/2014  . OA (osteoarthritis) of hip    right  . Otitis, externa, infective 03/31/2015  . Overweight 11/14/2012  . Right hip pain 08/11/2014   Sees chiropractor, Dr Clovis Riley in [place]   . Skin lesion of left leg 10/07/2015  . Tinnitus 10/12/2016  . Ulcer    esophageal ulcer hx  . Wears contact lenses     Past Surgical History:  Procedure Laterality Date  . COLONOSCOPY    . ESOPHAGOGASTRODUODENOSCOPY    . HELLER MYOTOMY  1999   help with swallowing  . TONSILLECTOMY    . TONSILLECTOMY AND ADENOIDECTOMY  1963  . 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   Procedure: LEFT TOTAL HIP ARTHROPLASTY ANTERIOR APPROACH;  Surgeon: Mcarthur Rossetti, MD;  Location: [facility];  Service: Orthopedics;  Laterality: Left;  . UPPER GASTROINTESTINAL ENDOSCOPY      Current Facility-Administered Medications  Medication Dose Route Frequency Provider Last Rate Last Dose  . 0.9 % irrigation (POUR BTL)    PRN Mcarthur Rossetti, MD   1,000 mL at 05/03/17 1202  . ceFAZolin (ANCEF) 2-4 GM/100ML-% IVPB           . ceFAZolin (ANCEF) IVPB 2g/100 mL premix  2 g Intravenous On Call to OR Pete Pelt, PA-C      . chlorhexidine (HIBICLENS) 4 % liquid 4 application  60 mL Topical Once Erskine Emery W, PA-C      . lactated ringers infusion   Intravenous Continuous Myrtie Soman, MD      . tranexamic acid (CYKLOKAPRON) 1,000 mg in sodium chloride 0.9 % 100 mL IVPB  1,000 mg Intravenous To SSTC Mcarthur Rossetti, MD       No Known Allergies  Social History   Tobacco Use  . Smoking status: Former Smoker    Types: Cigarettes    Last attempt to quit: 04/13/1991    Years since quitting: 26.0  . Smokeless tobacco: Never Used  .  Tobacco comment: 1 ppd for 25 years quit 51'  Substance Use Topics  . Alcohol use: Yes    Alcohol/week: 4.2 oz    Types: 7 Glasses of wine per week    Comment: Wine nightly    Family History  Problem Relation Age of Onset  . Alcohol abuse Mother   . Ovarian cancer Mother   . Diabetes Maternal Grandmother   . Alcohol abuse Father   . Lung cancer Father   . Mental illness Sister   . Depression Sister   . Heart disease Brother        congenital heart  . Arthritis Sister   . Hypertension Sister   . Diverticulosis Sister   . Healthy Sister   . Healthy Sister   . Healthy Sister   . Colon cancer Neg Hx   . Esophageal cancer Neg Hx   . Rectal cancer Neg Hx   . Stomach cancer Neg Hx      Review of Systems  Musculoskeletal: Positive for joint pain.  All other systems reviewed and are negative.   Objective:  Physical Exam  Constitutional: He is oriented to person, place, and time. He appears well-developed and well-nourished.  HENT:  Head: Normocephalic and atraumatic.  Eyes: EOM are normal. Pupils are equal, round, and reactive to light.  Neck: Normal range of motion. Neck supple.  Cardiovascular: Normal rate and regular rhythm.  Respiratory: Breath sounds normal.  GI: Soft. Bowel sounds are normal.  Musculoskeletal:       Right hip: He exhibits decreased range of motion, decreased strength, tenderness and bony tenderness.  Neurological: He is alert and oriented to person, place, and time.  Skin: Skin is warm and dry.  Psychiatric: He has a normal mood and affect.    Vital signs in last 24 hours: Temp:  [98 F (36.7 C)] 98 F (36.7 C) (01/22 1006) Pulse Rate:  [92] 92 (01/22 1006) Resp:  [20] 20 (01/22 1006) BP: (172-180)/(92) 172/92 (01/22 1014) SpO2:  [100 %] 100 % (01/22 1006) Weight:  [195 lb (88.5 kg)] 195 lb (88.5 kg) (01/22 1006)  Labs:   Estimated body mass index is 31.47 kg/m as calculated from the following:   Height as of 04/26/17: 5\' 6"  (1.676  m).   Weight as of this encounter: 195 lb (88.5 kg).   Imaging Review Plain radiographs demonstrate severe degenerative joint disease of the right hip(s). The bone quality appears to be excellent for age and reported activity level.  Assessment/Plan:  End stage arthritis, right hip(s)  The patient history, physical examination, clinical judgement of the provider and imaging studies are consistent with end stage degenerative joint disease of the right hip(s) and total hip arthroplasty is deemed medically necessary. The treatment options including medical management, injection therapy, arthroscopy and arthroplasty were discussed at length. The risks and benefits of total hip arthroplasty were presented and  reviewed. The risks due to aseptic loosening, infection, stiffness, dislocation/subluxation,  thromboembolic complications and other imponderables were discussed.  The patient acknowledged the explanation, agreed to proceed with the plan and consent was signed. Patient is being admitted for inpatient treatment for surgery, pain control, PT, OT, prophylactic antibiotics, VTE prophylaxis, progressive ambulation and ADL's and discharge planning.The patient is planning to be discharged home with home health services

## 2017-05-03 NOTE — Progress Notes (Signed)
Patient arrived to 6N17 A&Ox4, VSS, IV intact and infusing.  Patient states pain 7/10.  Noted to have mepilex dressing present on R hip.  No family present.  Patient oriented to room and equipment.  Will continue to monitor.

## 2017-05-03 NOTE — Anesthesia Postprocedure Evaluation (Signed)
Anesthesia Post Note  Patient: Gabriel [name]  Procedure(s) Performed: RIGHT TOTAL HIP ARTHROPLASTY ANTERIOR APPROACH (Right Hip)     Patient location during evaluation: PACU Anesthesia Type: Spinal Level of consciousness: oriented and awake and alert Pain management: pain level controlled Vital Signs Assessment: post-procedure vital signs reviewed and stable Respiratory status: spontaneous breathing, respiratory function stable and patient connected to nasal cannula oxygen Cardiovascular status: blood pressure returned to baseline and stable Postop Assessment: no headache, no backache and no apparent nausea or vomiting Anesthetic complications: no    Last Vitals:  Vitals:   05/03/17 1532 05/03/17 1547  BP: 106/68 108/66  Pulse: 67 66  Resp: 16 13  Temp:    SpO2: 100% 100%    Last Pain:  Vitals:   05/03/17 1559  TempSrc:   PainSc: 8                  Dermot Gremillion S

## 2017-05-03 NOTE — Transfer of Care (Signed)
Immediate Anesthesia Transfer of Care Note  Patient: Gabriel [name]  Procedure(s) Performed: RIGHT TOTAL HIP ARTHROPLASTY ANTERIOR APPROACH (Right Hip)  Patient Location: PACU  Anesthesia Type:Spinal  Level of Consciousness: awake, alert  and oriented  Airway & Oxygen Therapy: Patient Spontanous Breathing  Post-op Assessment: Report given to RN and Post -op Vital signs reviewed and stable  Post vital signs: Reviewed and stable  Last Vitals:  Vitals:   05/03/17 1006 05/03/17 1014  BP: (!) 180/92 (!) 172/92  Pulse: 92   Resp: 20   Temp: 36.7 C   SpO2: 100%     Last Pain:  Vitals:   05/03/17 1021  TempSrc:   PainSc: 7       Patients Stated Pain Goal: 3 (31/28/11 8867)  Complications: No apparent anesthesia complications pt verbalized comfort with spinal

## 2017-05-03 NOTE — Anesthesia Procedure Notes (Signed)
Spinal  Patient location during procedure: OR Start time: 05/03/2017 12:02 PM End time: 05/03/2017 12:15 PM Staffing Anesthesiologist: Myrtie Soman, MD Performed: anesthesiologist  Preanesthetic Checklist Completed: patient identified, site marked, surgical consent, pre-op evaluation, timeout performed, IV checked, risks and benefits discussed and monitors and equipment checked Spinal Block Patient position: sitting Prep: Betadine Patient monitoring: heart rate, continuous pulse ox and blood pressure Injection technique: single-shot Needle Needle type: Sprotte  Needle gauge: 24 G Needle length: 9 cm Additional Notes Expiration date of kit checked and confirmed. Patient tolerated procedure well, without complications.

## 2017-05-04 ENCOUNTER — Encounter (HOSPITAL_COMMUNITY): Payer: Self-pay | Admitting: Orthopaedic Surgery

## 2017-05-04 LAB — BASIC METABOLIC PANEL
Anion gap: 10 (ref 5–15)
BUN: 9 mg/dL (ref 6–20)
CALCIUM: 8 mg/dL — AB (ref 8.9–10.3)
CHLORIDE: 94 mmol/L — AB (ref 101–111)
CO2: 24 mmol/L (ref 22–32)
CREATININE: 0.78 mg/dL (ref 0.61–1.24)
GFR calc non Af Amer: >60 mL/min (ref >60)
Glucose, Bld: 148 mg/dL — ABNORMAL HIGH (ref 65–99)
Potassium: 3.6 mmol/L (ref 3.5–5.1)
SODIUM: 128 mmol/L — AB (ref 135–145)

## 2017-05-04 LAB — CBC
HCT: 34.5 % — ABNORMAL LOW (ref 39.0–52.0)
HEMOGLOBIN: 12.2 g/dL — AB (ref 13.0–17.0)
MCH: 32.9 pg (ref 26.0–34.0)
MCHC: 35.4 g/dL (ref 30.0–36.0)
MCV: 93 fL (ref 78.0–100.0)
Platelets: 249 10*3/uL (ref 150–400)
RBC: 3.71 MIL/uL — ABNORMAL LOW (ref 4.22–5.81)
RDW: 12.1 % (ref 11.5–15.5)
WBC: 10.9 10*3/uL — AB (ref 4.0–10.5)

## 2017-05-04 NOTE — Op Note (Signed)
Gabriel [name], Gabriel [name]             ACCOUNT NO.:  [id number]  MEDICAL RECORD NO.:  [id number]  LOCATION:                                 FACILITY:  PHYSICIAN:  Lind Guest. [name], M.D.DATE OF BIRTH:  DATE OF PROCEDURE:  05/03/2017 DATE OF DISCHARGE:                              OPERATIVE REPORT   PREOPERATIVE DIAGNOSIS:  Primary osteoarthritis and degenerative joint disease, right hip.  POSTOPERATIVE DIAGNOSIS:  Primary osteoarthritis and degenerative joint disease, right hip.  PROCEDURE:  A right total hip arthroplasty through direct anterior approach.  IMPLANTS:  DePuy Sector Gription acetabular component, size 60 with 2 screws, size 36 +4 polyethylene liner, size 14 Corail femoral component with varus offset, size 36 -2 metal hip ball.  SURGEON:  Lind Guest. [name], M.D.  ASSISTANT:  Erskine Emery, PA-C.  ANESTHESIA:  Spinal.  ANTIBIOTICS:  IV Ancef 2 g.  BLOOD LOSS:  300 to 350 mL.  COMPLICATIONS:  None.  INDICATIONS:  Mr. Gabriel [name] is a 67 year old gentleman, well known to me. He has bilateral hip osteoarthritis and degenerative joint disease.  In 2017, he underwent successful left total hip arthroplasty through direct anterior approach.  His right hip pain now has worsened significantly. His x-ray showed end-stage arthritis of the right hip.  With the success of his left hip, he does wish to proceed with a right total hip arthroplasty.  He understands the rationale behind the surgery as well as the risks and benefits involved.  These risks include the risk of acute blood loss anemia, nerve and vessel injury, fracture, infection, dislocation, DVT.  He understands our goals are to decrease pain, improve mobility, and overall improved quality of life.  PROCEDURE DESCRIPTION:  After informed consent was obtained, appropriate right hip was marked.  He was brought to the operating room.  Spinal anesthesia was obtained.  The Foley catheter was placed and  then, traction boots were placed on both his feet.  She was placed supine on the Hana fracture table with the perineal post in place and both legs in InLine skeletal traction devices, but no traction applied.  His right operative hip was prepped and draped with DuraPrep and sterile drapes.  Time-out was called and he was identified as correct patient and correct right hip.  We then made an incision just inferior and posterior to the anterior superior iliac spine and carried this obliquely down the leg.  We dissected down to tensor fascia lata muscle and tensor fascia was then divided longitudinally to proceed with a direct anterior approach to the hip.  We identified and cauterized circumflex vessels and then identified the hip capsule.  I opened up the hip capsule in an L-type format, finding a large joint effusion and significant arthritis in his right hip.  We placed Cobra retractors around the medial and lateral femoral neck and then made our femoral neck cut proximal to the lesser trochanter and completed this with an oscillating saw.  We removed the femoral head in its entirety and found it to be devoid of cartilage completely.  We then placed a bent Hohmann over the medial acetabular rim and removed significant debris from within the acetabulum including the labrum.  We  then began reaming from a size 43 in stepwise increments going all the way up to size 58 with all reamers under direct visualization, the last reamer under direct fluoroscopy, so we could obtain our depth of reaming, our inclination, and anteversion.  I then placed the real DePuy Sector Gription acetabular component size 58 and could just not get that to seat.  I removed the cup and based on feeling some insufficiency of bone in the anterior rim, I decided to go up to a size 60 acetabular component.  We placed this 60 cup down and it did sit and sink nicely and did not move. I still placed 2 additional screws.  We  then placed a 36 +4 polyethylene liner based off his offset and the fact that I felt like we had medialized the acetabular component.  Attention was then turned to the femur.  With the leg externally rotated to 120 degrees extended and adducted, we were able to place a Mueller retractor medially and a Hohmann retractor behind the greater trochanter, released the lateral joint capsule and used a box cutting osteotome to enter the femoral canal and a rongeur to lateralize.  We then began broaching from a size 8 broach using the Corail broaching system going in 1 mm increments up to a size 13.  With the 13 in place, we trialed a trial reduction, but the stem wanted to antevert and definitely showed the stem was too loose.  With that being said, after we reducing the acetabulum, we felt like we needed to go higher in leg length and longer stem, we broached up to a 14 and the 14 was slightly appropriate.  We felt like it was the appropriate stem because we went back to a 13 broach, it was still just too loose, so we placed the real size 14 Corail femoral component with varus offset and based on him starting with a shorter leg length, we felt like we needed to go up to a +5 hip ball, reduced this in the acetabulum and it was definitely unable to reduce, so we went down to a +1.5 and then finally to a -2 metal hip ball and that reducing the acetabulum and it was definitely stable with full range of motion, but I felt like his leg lengths showed that he is a little bit longer now on this side.  We then irrigated the soft tissue with normal saline solution using pulsatile lavage.  We closed the joint capsule with interrupted #1 Ethibond suture, followed by running #1 Vicryl in the tensor fascia, 0 Vicryl in the deep tissue, 2-0 Vicryl in the subcutaneous tissue, 4-0 Monocryl subcuticular stitch and Steri-Strips on the skin.  An Aquacel dressing was applied.  He was taken off the Hana table, taken  to the recovery room in stable condition.  All final counts were correct.  There were no complications noted.  Of note, Erskine Emery, PA-C, assisted in the entire case.  His assistance was crucial for facilitating all aspects of this case.     Lind Guest. [name], M.D.   ______________________________ Lind Guest. [name], M.D.    CYB/MEDQ  D:  05/03/2017  T:  05/03/2017  Job:  [id number]

## 2017-05-04 NOTE — Evaluation (Signed)
Occupational Therapy Evaluation Patient Details Name: Gabriel [name] MRN: [id number] DOB: 05/21/1950 Today's Date: 05/04/2017    History of Present Illness 66 y.o. male admitted on 05/03/17 for elective R THA direct anterior.  Pt with significant PMH of tinnitus, HTN, and L THA (2017).     Clinical Impression   PTA, pt was independent with ADL and functional mobility. He currently requires min assist for LB ADL and min guard assist for toilet transfers. He presents to OT session with post-operative R hip pain limiting his ability to participate in ADL at PLOF. Initiated education concerning compensatory strategies for LB ADL to maximize safety and independence post-acute D/C. Feel pt will progress well once pain is under control and do not anticipate need for further OT follow-up post-acute D/C. OT will continue to follow while admitted with focus on tub transfers.     Follow Up Recommendations  No OT follow up;Supervision/Assistance - 24 hour(initial)    Equipment Recommendations  None recommended by OT(has needs met)    Recommendations for Other Services       Precautions / Restrictions Precautions Precautions: None Restrictions Weight Bearing Restrictions: Yes RLE Weight Bearing: Weight bearing as tolerated      Mobility Bed Mobility Overal bed mobility: Needs Assistance Bed Mobility: Supine to Sit     Supine to sit: Supervision;HOB elevated     General bed mobility comments: Supervision for safety. VC's for technique.   Transfers Overall transfer level: Needs assistance Equipment used: Rolling walker (2 wheeled) Transfers: Sit to/from Stand Sit to Stand: Min guard         General transfer comment: Min guard assist for safety.     Balance Overall balance assessment: Needs assistance Sitting-balance support: Feet supported;No upper extremity supported Sitting balance-Leahy Scale: Good     Standing balance support: Bilateral upper extremity  supported Standing balance-Leahy Scale: Fair Standing balance comment: Relies on B UE support for dynamic tasks.                            ADL either performed or assessed with clinical judgement   ADL Overall ADL's : Needs assistance/impaired Eating/Feeding: Set up;Sitting   Grooming: Set up;Sitting   Upper Body Bathing: Set up;Sitting   Lower Body Bathing: Minimal assistance;Sit to/from stand   Upper Body Dressing : Set up;Sitting   Lower Body Dressing: Minimal assistance;Sit to/from stand   Toilet Transfer: Min guard;Ambulation;RW Toilet Transfer Details (indicate cue type and reason): Taking a few steps. Simulated from bed to chair.  Toileting- Clothing Manipulation and Hygiene: Min guard;Sit to/from stand       Functional mobility during ADLs: Rolling walker;Min guard General ADL Comments: Pt hesitant to move and limited by pain this session.      Vision Baseline Vision/History: (wears contacts) Patient Visual Report: No change from baseline Vision Assessment?: No apparent visual deficits     Perception     Praxis      Pertinent Vitals/Pain Pain Assessment: Faces Pain Score: 10-Worst pain ever Faces Pain Scale: Hurts even more Pain Location: R hip Pain Descriptors / Indicators: Aching;Burning Pain Intervention(s): Limited activity within patient's tolerance;Monitored during session;Repositioned;Ice applied     Hand Dominance Right   Extremity/Trunk Assessment Upper Extremity Assessment Upper Extremity Assessment: Overall WFL for tasks assessed   Lower Extremity Assessment Lower Extremity Assessment: RLE deficits/detail RLE Deficits / Details: Decreased strength and AROM as expected post-operatively.    Cervical / Trunk Assessment Cervical / Trunk   Assessment: Normal   Communication Communication Communication: No difficulties   Cognition Arousal/Alertness: Awake/alert Behavior During Therapy: WFL for tasks assessed/performed Overall  Cognitive Status: Within Functional Limits for tasks assessed                                     General Comments       Exercises Exercises: Total Joint Total Joint Exercises Ankle Circles/Pumps: AROM;Both;20 reps Quad Sets: AROM;Right;10 reps Heel Slides: AAROM;Right;10 reps Hip ABduction/ADduction: AAROM;Right;10 reps Long Arc Quad: AROM;Right;10 reps   Shoulder Instructions      Home Living Family/patient expects to be discharged to:: Private residence Living Arrangements: Spouse/significant other Available Help at Discharge: Family;Available 24 hours/day Type of Home: House Home Access: Level entry     Home Layout: One level     Bathroom Shower/Tub: Tub/shower unit   Bathroom Toilet: Handicapped height     Home Equipment: Walker - 2 wheels;Bedside commode;Shower seat;Cane - single point;Hand held shower head          Prior Functioning/Environment Level of Independence: Independent        Comments: Drives tractor trailer for work        OT Problem List: Decreased strength;Decreased range of motion;Decreased activity tolerance;Impaired balance (sitting and/or standing);Decreased safety awareness;Decreased knowledge of use of DME or AE;Decreased knowledge of precautions;Pain      OT Treatment/Interventions: Self-care/ADL training;Therapeutic exercise;Energy conservation;DME and/or AE instruction;Therapeutic activities;Patient/family education;Balance training    OT Goals(Current goals can be found in the care plan section) Acute Rehab OT Goals Patient Stated Goal: to get back to pain free living OT Goal Formulation: With patient/family Time For Goal Achievement: 05/18/17 Potential to Achieve Goals: Good ADL Goals Pt Will Perform Grooming: with modified independence;standing Pt Will Perform Lower Body Dressing: with modified independence;sit to/from stand Pt Will Transfer to Toilet: with modified independence;ambulating;bedside commode(BSC  over toilet) Pt Will Perform Toileting - Clothing Manipulation and hygiene: with modified independence;sit to/from stand Pt Will Perform Tub/Shower Transfer: Tub transfer;with modified independence;rolling walker;3 in 1;shower seat  OT Frequency: Min 2X/week   Barriers to D/C:            Co-evaluation              AM-PAC PT "6 Clicks" Daily Activity     Outcome Measure Help from another person eating meals?: A Little Help from another person taking care of personal grooming?: A Little Help from another person toileting, which includes using toliet, bedpan, or urinal?: A Little Help from another person bathing (including washing, rinsing, drying)?: A Little Help from another person to put on and taking off regular upper body clothing?: A Little Help from another person to put on and taking off regular lower body clothing?: A Little 6 Click Score: 18   End of Session Equipment Utilized During Treatment: Rolling walker Nurse Communication: Mobility status  Activity Tolerance: Patient tolerated treatment well Patient left: in chair;with call bell/phone within reach;with family/visitor present  OT Visit Diagnosis: Other abnormalities of gait and mobility (R26.89);Pain Pain - Right/Left: Right Pain - part of body: Hip                Time: 1014-1030 OT Time Calculation (min): 16 min Charges:  OT General Charges $OT Visit: 1 Visit OT Evaluation $OT Eval Moderate Complexity: 1 Mod G-Codes:     Charity A Byrum, MS OTR/L  Pager: [phone]   Charity A Byrum 05/04/2017,   12:48 PM 

## 2017-05-04 NOTE — Progress Notes (Signed)
Physical Therapy Treatment Patient Details Name: Gabriel [name] MRN: [id number] DOB: 1950/07/24 Today's Date: 05/04/2017    History of Present Illness 67 y.o. male admitted on 05/03/17 for elective R THA direct anterior.  Pt with significant PMH of tinnitus, HTN, and L THA (2017).      PT Comments    Pt is POD #1 and this is his second session. He continues to report 10/10 R hip pain, but was able to get up and do a little hallway walking with me this PM (min guard to min assist overall).  HEP exercises were progressed this PM.  PT will continue to follow acutely and anticipate he will be ready to d/c home after AM session tomorrow.    DC plan and follow up therapy as arranged by surgeon     Equipment Recommendations  None recommended by PT    Recommendations for Other Services   NA     Precautions / Restrictions Precautions Precautions: None Restrictions RLE Weight Bearing: Weight bearing as tolerated    Mobility  Bed Mobility Overal bed mobility: Needs Assistance Bed Mobility: Supine to Sit;Sit to Supine     Supine to sit: Min assist;HOB elevated Sit to supine: Min assist;HOB elevated   General bed mobility comments: Min assist to mostly help progress his right leg into and out of the bed.   Transfers Overall transfer level: Needs assistance Equipment used: Rolling walker (2 wheeled) Transfers: Sit to/from Stand Sit to Stand: Supervision         General transfer comment: supervision for safety, safe hand placement without verbal cues  Ambulation/Gait Ambulation/Gait assistance: Min guard Ambulation Distance (Feet): 85 Feet Assistive device: Rolling walker (2 wheeled) Gait Pattern/deviations: Step-to pattern;Antalgic Gait velocity: decreased Gait velocity interpretation: Below normal speed for age/gender General Gait Details: Moderately antalgic gait pattern continues, reinforced upright gait and upright posture.        Balance Overall balance  assessment: Needs assistance Sitting-balance support: Feet supported;No upper extremity supported Sitting balance-Leahy Scale: Good     Standing balance support: Bilateral upper extremity supported Standing balance-Leahy Scale: Fair                              Cognition Arousal/Alertness: Awake/alert Behavior During Therapy: WFL for tasks assessed/performed Overall Cognitive Status: Within Functional Limits for tasks assessed                                        Exercises Total Joint Exercises Ankle Circles/Pumps: AROM;Both;20 reps Quad Sets: AROM;Right;10 reps Short Arc Quad: AROM;Right;10 reps Heel Slides: AAROM;Right;10 reps Hip ABduction/ADduction: AAROM;Right;10 reps Long Arc Quad: AROM;Right;10 reps        Pertinent Vitals/Pain Pain Assessment: 0-10 Pain Score: 10-Worst pain ever Pain Location: R hip Pain Descriptors / Indicators: Aching;Burning Pain Intervention(s): Limited activity within patient's tolerance;Monitored during session;Repositioned;Ice applied        PT Goals (current goals can now be found in the care plan section) Acute Rehab PT Goals Patient Stated Goal: to get back to pain free living Progress towards PT goals: Progressing toward goals    Frequency    7X/week      PT Plan Current plan remains appropriate       AM-PAC PT "6 Clicks" Daily Activity  Outcome Measure  Difficulty turning over in bed (including adjusting bedclothes, sheets and blankets)?: Unable  Difficulty moving from lying on back to sitting on the side of the bed? : Unable Difficulty sitting down on and standing up from a chair with arms (e.g., wheelchair, bedside commode, etc,.)?: None Help needed moving to and from a bed to chair (including a wheelchair)?: A Little Help needed walking in hospital room?: A Little Help needed climbing 3-5 steps with a railing? : A Little 6 Click Score: 15    End of Session Equipment Utilized During  Treatment: Gait belt Activity Tolerance: No increased pain Patient left: in chair;with call bell/phone within reach   PT Visit Diagnosis: Muscle weakness (generalized) (M62.81);Difficulty in walking, not elsewhere classified (R26.2);Pain Pain - Right/Left: Right Pain - part of body: Hip     Time: 3546-5681 PT Time Calculation (min) (ACUTE ONLY): 23 min  Charges:  $Gait Training: 8-22 mins $Therapeutic Exercise: 8-22 mins          Sue Mcalexander B. Clarke, [place], DPT [phone]            05/04/2017, 5:07 PM

## 2017-05-04 NOTE — Evaluation (Signed)
Physical Therapy Evaluation Patient Details Name: Gabriel [name] MRN: [id number] DOB: 08/22/1950 Today's Date: 05/04/2017   History of Present Illness  67 y.o. male admitted on 05/03/17 for elective R THA direct anterior.  Pt with significant PMH of tinnitus, HTN, and L THA (2017).    Clinical Impression  Pt is POD #1 and is moving very well (min guard to supervision with hallway ambulation with RW) despite reported 10/10 R hip pain.  HEP program initiated.  PT will see him again this PM.   PT to follow acutely for deficits listed below.     Follow Up Recommendations DC plan and follow up therapy as arranged by surgeon    Equipment Recommendations  None recommended by PT    Recommendations for Other Services   NA    Precautions / Restrictions Precautions Precautions: None Restrictions RLE Weight Bearing: Weight bearing as tolerated      Mobility  Bed Mobility               General bed mobility comments: Pt was OOB in the recliner chair.   Transfers Overall transfer level: Needs assistance   Transfers: Sit to/from Stand Sit to Stand: Supervision         General transfer comment: supervision for safety, safe hand placement without verbal cues  Ambulation/Gait Ambulation/Gait assistance: Min guard;Supervision Ambulation Distance (Feet): 130 Feet Assistive device: Rolling walker (2 wheeled) Gait Pattern/deviations: Step-to pattern;Antalgic Gait velocity: decreased Gait velocity interpretation: Below normal speed for age/gender General Gait Details: Pt with moderately antalgic gait pattern, verbal cues for safesty LE sequencing and RW use, verbal cues for upright posture.          Balance Overall balance assessment: Needs assistance Sitting-balance support: Feet supported;No upper extremity supported Sitting balance-Leahy Scale: Good     Standing balance support: Bilateral upper extremity supported Standing balance-Leahy Scale: Fair                               Pertinent Vitals/Pain Pain Assessment: 0-10 Pain Score: 10-Worst pain ever Pain Location: R hip Pain Descriptors / Indicators: Aching;Burning Pain Intervention(s): Limited activity within patient's tolerance;Monitored during session;Repositioned;Premedicated before session;Ice applied    Home Living Family/patient expects to be discharged to:: Private residence Living Arrangements: Spouse/significant other Available Help at Discharge: Family;Available 24 hours/day Type of Home: House Home Access: Level entry     Home Layout: One level Home Equipment: Walker - 2 wheels;Bedside commode;Shower seat;Cane - single point;Hand held shower head      Prior Function Level of Independence: Independent         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  Ankle at least 3/5, knee ext at least 3/5, hip 2/5 per gross functional assessment    Cervical / Trunk Assessment Cervical / Trunk Assessment: Normal  Communication   Communication: No difficulties  Cognition Arousal/Alertness: Awake/alert Behavior During Therapy: WFL for tasks assessed/performed Overall Cognitive Status: Within Functional Limits for tasks assessed  Exercises Total Joint Exercises Ankle Circles/Pumps: AROM;Both;20 reps Quad Sets: AROM;Right;10 reps Heel Slides: AAROM;Right;10 reps Hip ABduction/ADduction: AAROM;Right;10 reps Long Arc Quad: AROM;Right;10 reps   Assessment/Plan    PT Assessment Patient needs continued PT services  PT Problem List Decreased strength;Decreased range of motion;Decreased activity  tolerance;Decreased balance;Decreased mobility;Decreased knowledge of use of DME;Pain       PT Treatment Interventions DME instruction;Gait training;Functional mobility training;Stair training;Therapeutic activities;Therapeutic exercise;Balance training;Patient/family education;Manual techniques;Modalities    PT Goals (Current goals can be found in the Care Plan section)  Acute Rehab PT Goals Patient Stated Goal: to get back to pain free living PT Goal Formulation: With patient/family Time For Goal Achievement: 05/11/17 Potential to Achieve Goals: Good    Frequency 7X/week           AM-PAC PT "6 Clicks" Daily Activity  Outcome Measure Difficulty turning over in bed (including adjusting bedclothes, sheets and blankets)?: Unable Difficulty moving from lying on back to sitting on the side of the bed? : Unable Difficulty sitting down on and standing up from a chair with arms (e.g., wheelchair, bedside commode, etc,.)?: None Help needed moving to and from a bed to chair (including a wheelchair)?: A Little Help needed walking in hospital room?: A Little Help needed climbing 3-5 steps with a railing? : A Little 6 Click Score: 15    End of Session Equipment Utilized During Treatment: Gait belt Activity Tolerance: No increased pain Patient left: in chair;with call bell/phone within reach   PT Visit Diagnosis: Muscle weakness (generalized) (M62.81);Difficulty in walking, not elsewhere classified (R26.2);Pain Pain - Right/Left: Right Pain - part of body: Hip    Time: 5681-2751 PT Time Calculation (min) (ACUTE ONLY): 18 min   Charges:         Wells Guiles B. Beula Joyner, PT, DPT [phone]   PT Evaluation $PT Eval Low Complexity: 1 Low     05/04/2017, 11:18 AM

## 2017-05-04 NOTE — Progress Notes (Signed)
Subjective: 1 Day Post-Op Procedure(s) (LRB): RIGHT TOTAL HIP ARTHROPLASTY ANTERIOR APPROACH (Right) Patient reports pain as moderate.    Objective: Vital signs in last 24 hours: Temp:  [97.5 F (36.4 C)-99.3 F (37.4 C)] 99.2 F (37.3 C) (01/23 0544) Pulse Rate:  [64-107] 107 (01/23 0544) Resp:  [12-20] 18 (01/23 0544) BP: (81-180)/(52-92) 135/66 (01/23 0544) SpO2:  [95 %-100 %] 97 % (01/23 0544) Weight:  [195 lb (88.5 kg)] 195 lb (88.5 kg) (01/22 1006)  Intake/Output from previous day: 01/22 0701 - 01/23 0700 In: 2945 [P.O.:780; I.V.:2165] Out: 1775 [Urine:1425; Blood:350] Intake/Output this shift: No intake/output data recorded.  Recent Labs    05/03/17 1038  HGB 15.0   Recent Labs    05/03/17 1038  HCT 44.0   Recent Labs    05/03/17 1038  NA 135  K 3.9  GLUCOSE 113*   No results for input(s): LABPT, INR in the last 72 hours.  Sensation intact distally Intact pulses distally Dorsiflexion/Plantar flexion intact Incision: dressing C/D/I  Assessment/Plan: 1 Day Post-Op Procedure(s) (LRB): RIGHT TOTAL HIP ARTHROPLASTY ANTERIOR APPROACH (Right) Up with therapy Plan for discharge tomorrow Discharge home with home health  Gabriel [name] 05/04/2017, 7:24 AM

## 2017-05-04 NOTE — Discharge Instructions (Signed)

## 2017-05-04 NOTE — Care Management Note (Signed)
Case Management Note  Patient Details  Name: Gabriel [name] MRN: [id number] Date of Birth: 12-06-1950  Subjective/Objective:                    Action/Plan:  Patient already has walker and 3 in 1 at home  Expected Discharge Date:                  Expected Discharge Plan:  [place]  In-House Referral:     Discharge planning Services  CM Consult  Post Acute Care Choice:  Home Health, Durable Medical Equipment Choice offered to:  Patient  DME Arranged:  N/A DME Agency:  NA  HH Arranged:  PT [place] Agency:  [facility] (now [facility])  Status of Service:     If discussed at [employer] of Stay Meetings, dates discussed:    Additional Comments:  Marilu Favre, RN 05/04/2017, 1:18 PM

## 2017-05-05 LAB — CBC
HCT: 31.9 % — ABNORMAL LOW (ref 39.0–52.0)
HEMOGLOBIN: 11 g/dL — AB (ref 13.0–17.0)
MCH: 31.8 pg (ref 26.0–34.0)
MCHC: 34.5 g/dL (ref 30.0–36.0)
MCV: 92.2 fL (ref 78.0–100.0)
PLATELETS: 221 10*3/uL (ref 150–400)
RBC: 3.46 MIL/uL — AB (ref 4.22–5.81)
RDW: 12.1 % (ref 11.5–15.5)
WBC: 11.2 10*3/uL — AB (ref 4.0–10.5)

## 2017-05-05 MED ORDER — ASPIRIN 81 MG PO CHEW: 81.0000 mg | CHEWABLE_TABLET | Freq: Two times a day (BID) | ORAL | 0 refills | Status: DC

## 2017-05-05 MED ORDER — METHOCARBAMOL 500 MG PO TABS: 500.0000 mg | ORAL_TABLET | Freq: Four times a day (QID) | ORAL | 0 refills | Status: DC | PRN

## 2017-05-05 MED ORDER — OXYCODONE-ACETAMINOPHEN 5-325 MG PO TABS: 1.0000 | ORAL_TABLET | ORAL | 0 refills | Status: DC | PRN

## 2017-05-05 NOTE — Progress Notes (Signed)
Discharge instruction reviewed with Pt and his wife this AM, Pt advised that he has had a hip replacement before and was aware of education and what to look for.  However, we did go over the discharge instruction.  Pt was given his prescriptions. Pt left via w/c with staff to the front doors, where he left with his wife. Pt indicated that he did have his walker in the vehicle with him. Pt was given his discharge instructions, prescription and his belongings with him at time of discharge

## 2017-05-05 NOTE — Discharge Summary (Signed)
Patient ID: Gabriel [name] MRN: [id number] DOB/AGE: April 25, 1950 67 y.o.  Admit date: 05/03/2017 Discharge date: 05/05/2017  Admission Diagnoses:  Principal Problem:   Unilateral primary osteoarthritis, right hip Active Problems:   Status post total replacement of right hip   Discharge Diagnoses:  Same  Past Medical History:  Diagnosis Date  . Achalasia   . Arthritis   . Colon cancer screening 08/11/2014   Sees [facility], Dr Carlean Purl   . Colon polyp 08/11/2014   Sees [facility], Dr Carlean Purl    . Eczema 01/09/2014  . Esophageal reflux 05/21/2012  . Hyperlipidemia   . Hypertension   . Insomnia 01/09/2014  . Left hip pain 08/11/2014  . OA (osteoarthritis) of hip    right  . Otitis, externa, infective 03/31/2015  . Overweight 11/14/2012  . Right hip pain 08/11/2014   Sees chiropractor, Dr Clovis Riley in [place]   . Skin lesion of left leg 10/07/2015  . Tinnitus 10/12/2016  . Ulcer    esophageal ulcer hx  . Wears contact lenses     Surgeries: Procedure(s): RIGHT TOTAL HIP ARTHROPLASTY ANTERIOR APPROACH on 05/03/2017   Consultants:   Discharged Condition: Improved  Hospital Course: Gabriel [name] is an 67 y.o. male who was admitted 05/03/2017 for operative treatment ofUnilateral primary osteoarthritis, right hip. Patient has severe unremitting pain that affects sleep, daily activities, and work/hobbies. After pre-op clearance the patient was taken to the operating room on 05/03/2017 and underwent  Procedure(s): RIGHT TOTAL HIP ARTHROPLASTY ANTERIOR APPROACH.    Patient was given perioperative antibiotics:  Anti-infectives (From admission, onward)   Start     Dose/Rate Route Frequency Ordered Stop   05/03/17 1415  ceFAZolin (ANCEF) IVPB 1 g/50 mL premix     1 g 100 mL/hr over 30 Minutes Intravenous Every 6 hours 05/03/17 1410 05/03/17 2334   05/03/17 1004  ceFAZolin (ANCEF) 2-4 GM/100ML-% IVPB    Comments:  Leandrew Koyanagi   : cabinet override      05/03/17 1004 05/03/17 1228   05/03/17 1002  ceFAZolin  (ANCEF) IVPB 2g/100 mL premix     2 g 200 mL/hr over 30 Minutes Intravenous On call to O.R. 05/03/17 1002 05/03/17 1228       Patient was given sequential compression devices, early ambulation, and chemoprophylaxis to prevent DVT.  Patient benefited maximally from hospital stay and there were no complications.    Recent vital signs:  Patient Vitals for the past 24 hrs:  BP Temp Temp src Pulse Resp SpO2  05/05/17 0552 137/75 99.1 F (37.3 C) Oral (!) 102 19 97 %  05/04/17 2229 138/75 99.4 F (37.4 C) Oral 96 18 96 %  05/04/17 1409 (!) 154/82 99.9 F (37.7 C) Oral 100 18 98 %     Recent laboratory studies:  Recent Labs    05/03/17 1038 05/04/17 0851  WBC  --  10.9*  HGB 15.0 12.2*  HCT 44.0 34.5*  PLT  --  249  NA 135 128*  K 3.9 3.6  CL  --  94*  CO2  --  24  BUN  --  9  CREATININE  --  0.78  GLUCOSE 113* 148*  CALCIUM  --  8.0*     Discharge Medications:   Allergies as of 05/05/2017   No Known Allergies     Medication List    TAKE these medications   ANTIOXIDANT VITAMINS Tabs Take 1 tablet by mouth daily. Life Guard   aspirin 81 MG chewable tablet Chew 1 tablet (81  mg total) by mouth 2 (two) times daily.   enalapril 10 MG tablet Commonly known as:  VASOTEC TAKE 1 TABLET (10 MG TOTAL) BY MOUTH DAILY.   etodolac 500 MG tablet Commonly known as:  LODINE TAKE 1 TABLET BY MOUTH TWICE A DAY   hydrochlorothiazide 12.5 MG tablet Commonly known as:  HYDRODIURIL TAKE 1 TABLET (12.5 MG TOTAL) BY MOUTH DAILY.   hyoscyamine 0.125 MG SL tablet Commonly known as:  LEVSIN SL PLACE 1 TABLET (0.125 MG TOTAL) UNDER THE TONGUE EVERY 4 (FOUR) HOURS AS NEEDED.   methocarbamol 500 MG tablet Commonly known as:  ROBAXIN Take 1 tablet (500 mg total) by mouth every 6 (six) hours as needed for muscle spasms.   mupirocin ointment 2 % Commonly known as:  BACTROBAN Apply to b/l nares qhs x 7 days then as needed What changed:    how much to take  how to take  this  when to take this  reasons to take this  additional instructions   naproxen sodium 220 MG tablet Commonly known as:  ALEVE Take 220 mg by mouth 3 (three) times daily as needed (pain).   omeprazole 40 MG capsule Commonly known as:  PRILOSEC Take 1 capsule (40 mg total) by mouth daily.   oxyCODONE-acetaminophen 5-325 MG tablet Commonly known as:  PERCOCET Take 1-2 tablets by mouth every 4 (four) hours as needed for severe pain.   pimecrolimus 1 % cream Commonly known as:  ELIDEL Apply 1 application topically daily. What changed:    when to take this  reasons to take this   Red Yeast Rice Extract 600 MG Tabs Take 1 tablet by mouth daily.   TURMERIC PO Take 1 capsule by mouth daily.            Durable Medical Equipment  (From admission, onward)        Start     Ordered   05/03/17 1833  DME 3 n 1  Once     05/03/17 1832   05/03/17 1833  DME Walker rolling  Once    Question:  Patient needs a walker to treat with the following condition  Answer:  Status post total replacement of right hip   05/03/17 1832      Diagnostic Studies: Dg Pelvis Portable  Result Date: 05/03/2017 CLINICAL DATA:  Status post right total hip replacement. EXAM: PORTABLE PELVIS 1-2 VIEWS COMPARISON:  Intraoperative fluoro spot views of today's date FINDINGS: The patient has undergone right total hip joint prosthesis placement. Radiographic positioning of the prosthetic components is good. The interface with the native bone appears normal. There small amounts of soft tissue gas present. IMPRESSION: No postprocedure complication following right total hip joint prosthesis placement. Electronically Signed   By: David  Martinique M.D.   On: 05/03/2017 14:45   Dg C-arm 1-60 Min  Result Date: 05/03/2017 CLINICAL DATA:  Hip replacement. EXAM: DG C-ARM 61-120 MIN COMPARISON:  03/16/2016. FINDINGS: Total right hip replacement with anatomic alignment. Hardware intact. 0 minutes 49 seconds fluoroscopy  time. IMPRESSION: Total right hip replacement with anatomic alignment. Electronically Signed   By: Marcello Moores  Register   On: 05/03/2017 14:16   Dg C-arm 1-60 Min  Result Date: 05/03/2017 CLINICAL DATA:  Hip replacement. EXAM: DG C-ARM 61-120 MIN COMPARISON:  Prior study 03/16/2016. FINDINGS: Total right hip replacement anatomic alignment. 2 images obtained. 0 minutes 49 seconds fluoroscopy time utilized. IMPRESSION: Total right hip replacement anatomic alignment. Electronically Signed   By: Marcello Moores  Register  On: 05/03/2017 14:16   Dg Hip Operative Unilat W Or W/o Pelvis Right  Result Date: 05/03/2017 CLINICAL DATA:  Hip replacement. EXAM: OPERATIVE RIGHT HIP (WITH PELVIS IF PERFORMED) TECHNIQUE: Fluoroscopic spot image(s) were submitted for interpretation post-operatively. COMPARISON:  10/25/2016. FINDINGS: Total right hip replacement with anatomic alignment. Hardware intact. 0 minutes 49 seconds fluoroscopy utilized. IMPRESSION: Total right hip replacement with anatomic alignment. Electronically Signed   By: [place]   On: 05/03/2017 14:17    Disposition: 06-Home-Health Care Svc    Follow-up Information    Mcarthur Rossetti, MD Follow up in 2 week(s).   Specialty:  Orthopedic Surgery Contact information: [place] [address] [phone]            Signed: Mcarthur Rossetti 05/05/2017, 7:05 AM

## 2017-05-05 NOTE — Progress Notes (Signed)
Subjective: 2 Days Post-Op Procedure(s) (LRB): RIGHT TOTAL HIP ARTHROPLASTY ANTERIOR APPROACH (Right) Patient reports pain as moderate.    Objective: Vital signs in last 24 hours: Temp:  [99.1 F (37.3 C)-99.9 F (37.7 C)] 99.1 F (37.3 C) (01/24 0552) Pulse Rate:  [96-102] 102 (01/24 0552) Resp:  [18-19] 19 (01/24 0552) BP: (137-154)/(75-82) 137/75 (01/24 0552) SpO2:  [96 %-98 %] 97 % (01/24 0552)  Intake/Output from previous day: 01/23 0701 - 01/24 0700 In: 636.3 [P.O.:240; I.V.:396.3] Out: -  Intake/Output this shift: No intake/output data recorded.  Recent Labs    05/03/17 1038 05/04/17 0851  HGB 15.0 12.2*   Recent Labs    05/03/17 1038 05/04/17 0851  WBC  --  10.9*  RBC  --  3.71*  HCT 44.0 34.5*  PLT  --  249   Recent Labs    05/03/17 1038 05/04/17 0851  NA 135 128*  K 3.9 3.6  CL  --  94*  CO2  --  24  BUN  --  9  CREATININE  --  0.78  GLUCOSE 113* 148*  CALCIUM  --  8.0*   No results for input(s): LABPT, INR in the last 72 hours.  Sensation intact distally Intact pulses distally Dorsiflexion/Plantar flexion intact Incision: scant drainage  Assessment/Plan: 2 Days Post-Op Procedure(s) (LRB): RIGHT TOTAL HIP ARTHROPLASTY ANTERIOR APPROACH (Right) Up with therapy Discharge home with home health today.  Mcarthur Rossetti 05/05/2017, 7:03 AM

## 2017-05-06 ENCOUNTER — Telehealth (INDEPENDENT_AMBULATORY_CARE_PROVIDER_SITE_OTHER): Payer: Self-pay

## 2017-05-06 NOTE — Telephone Encounter (Signed)
Verdis Frederickson PT with [facility] would like verbal orders for patient for 1 x week for 1 week 3 x week for 1 week 2 x week for 1 week CB# is [phone].  Please advise.  Thank you.

## 2017-05-09 ENCOUNTER — Encounter (INDEPENDENT_AMBULATORY_CARE_PROVIDER_SITE_OTHER): Payer: Self-pay | Admitting: Orthopaedic Surgery

## 2017-05-09 NOTE — Telephone Encounter (Signed)
Verbal order given  

## 2017-05-12 ENCOUNTER — Encounter (INDEPENDENT_AMBULATORY_CARE_PROVIDER_SITE_OTHER): Payer: Self-pay | Admitting: Physician Assistant

## 2017-05-12 ENCOUNTER — Other Ambulatory Visit: Payer: Self-pay | Admitting: *Deleted

## 2017-05-12 ENCOUNTER — Ambulatory Visit (INDEPENDENT_AMBULATORY_CARE_PROVIDER_SITE_OTHER): Payer: Managed Care, Other (non HMO) | Admitting: Physician Assistant

## 2017-05-12 DIAGNOSIS — Z96641 Presence of right artificial hip joint: Secondary | ICD-10-CM

## 2017-05-12 MED ORDER — DOXYCYCLINE HYCLATE 100 MG PO CAPS: 100.0000 mg | ORAL_CAPSULE | Freq: Two times a day (BID) | ORAL | 0 refills | Status: DC

## 2017-05-12 NOTE — Patient Outreach (Addendum)
[place] [facility]) Care Management  05/12/2017  Gabriel [name] 06/06/1950 [id number]   Subjective: Telephone call to patient's home  / mobile number, no answer, left HIPAA compliant voicemail message, and requested call back.    Objective: Per KPN (Knowledge Performance Now, point of care tool), [employer] iCollaborate, and chart review, patient hospitalized 05/03/17 -05/05/17 for Unilateral primary osteoarthritis, right hip.   Status post total replacement of right hip on 05/03/17.  Patient hospitalized 12/ 5/17 - 03/17/16 for primary osteoarthritis, left hip. Status post Left total hip arthroplasty through direct anterior approach. Right hip intra-articular steroid injection under fluoroscopy on 03/16/16. Patient also has a history of hypertension, achalasia, and hyperlipidemia.  [facility] Care Management Transition of care follow up completed on 03/30/16.       Assessment:  Received [employer] Transition of care referral on 05/11/17.    Transition of care follow up pending patient contact.       Plan: RNCM will call patient for 2nd telephone outreach attempt, transition of care follow up, within 10 business days if no return call.     Katsumi Wisler H. Annia Friendly, BSN, [place] Management [facility] Telephonic CM Phone: [phone] Fax: [phone]

## 2017-05-12 NOTE — Progress Notes (Signed)
Gabriel [name] returns today 9 days status post right total hip arthroplasty.  He comes in today due to some redness and drainage about the wound.  He noticed the range the redness today.  He has had no fevers chills shortness of breath chest pain. Review of systems negative for fevers chills chest pain calf pain.  Physical exam: General well-developed well-nourished male in no acute distress.  He ambulates with out any assistive device. Right hip: There is erythema consistent with a contact dermatitis that basically follows the outline of the Aquacel dressing.  There is no purulence.  His surgical incisions well approximated with Steri-Strips there is some blistering though about the area of the Steri-Strips and the Aquacel.  Right calf supple nontender  Impression: 9 days status post right total hip arthroplasty   Contact dermatitis plan: We will have him go on Zyrtec 24-hour  That he can pick up over-the-counter.  Empirically will place him on doxycycline and have him Place Bactroban over the incision of the thin amount twice daily.  He is to wash the wound with an antibacterial soap daily.  He can apply dry dressing with some 4 x 4's and minimal paper tape to hold it in place.  We will see him back next Tuesday to check his  wound sooner if he has any signs of purulence or gross infection.

## 2017-05-13 ENCOUNTER — Encounter: Payer: Self-pay | Admitting: *Deleted

## 2017-05-13 ENCOUNTER — Ambulatory Visit: Payer: Self-pay | Admitting: *Deleted

## 2017-05-13 ENCOUNTER — Other Ambulatory Visit: Payer: Self-pay | Admitting: *Deleted

## 2017-05-13 NOTE — Patient Outreach (Signed)
[place] [facility]) Care Management  05/13/2017  Gabriel [name] 06-12-1950 [id number]   Subjective: Received voicemail message from Gabriel [name], states he is returning call, and requested call back. Telephone call to patient's home / mobile number, spoke with patient, and HIPAA verified.  Discussed [facility] Care Management [employer] Transition of care follow up, patient voiced understanding, and is in agreement to follow up.  Patient states he is doing much better, an allergic reaction to the surgical dressing, issue addressed by the surgeon's office on 05/12/17, and has another follow up appointment with surgeon on 05/17/17.  Patient states he is able to manage self care and has assistance as needed with activities of daily living / home management.   Patient voices understanding of medical diagnosis, surgery, and treatment plan.  States he is accessing her [name] benefits as needed via member services number on back of card or through [email or web address]   Patient states he does not have any education material, transition of care, care coordination, disease management, disease monitoring, transportation, community resource, or pharmacy needs at this time.  States he is very appreciative of the follow up and is in agreement to receive [place] Management information.      Objective: Per KPN (Knowledge Performance Now, point of care tool), [employer] iCollaborate, and chart review, patient hospitalized 05/03/17 -05/05/17 for Unilateral primary osteoarthritis, right hip.   Status post total replacement of right hip on 05/03/17.  Patient hospitalized 12/ 5/17 - 03/17/16 for primary osteoarthritis, left hip. Status post Left total hip arthroplasty through direct anterior approach. Right hip intra-articular steroid injection under fluoroscopy on 03/16/16. Patient also has a history of hypertension, achalasia, and hyperlipidemia.  [facility] Care Management Transition of care follow up completed on 03/30/16.        Assessment:  Received [employer] Transition of care referral on 05/11/17.    Transition of care follow up completed, no care management needs, and will proceed with case closure.       Plan: RNCM will send patient successful outreach letter, [facility] pamphlet, and magnet. RNCM will send case closure due to follow up completed / no care management needs request to [name] at [facility] Management.      [name] H. Annia Friendly, BSN, [place] Management [facility] Telephonic CM Phone: [phone] Fax: [phone]

## 2017-05-17 ENCOUNTER — Encounter (INDEPENDENT_AMBULATORY_CARE_PROVIDER_SITE_OTHER): Payer: Self-pay | Admitting: Orthopaedic Surgery

## 2017-05-17 ENCOUNTER — Ambulatory Visit (INDEPENDENT_AMBULATORY_CARE_PROVIDER_SITE_OTHER): Payer: Managed Care, Other (non HMO) | Admitting: Orthopaedic Surgery

## 2017-05-17 DIAGNOSIS — Z96641 Presence of right artificial hip joint: Secondary | ICD-10-CM

## 2017-05-17 NOTE — Progress Notes (Signed)
Gabriel [name] returns today for follow-up of his right hip wound.  He is overall doing well.  States the wound is trending towards improvement.  Said no fevers chills.  Physical exam: Right hip has good range of motion of the hip without significant pain.  He is able to stand up on his own is ambulate without any assistive device.  Surgical incisions healing well.  Blistering and erythema about the wound are definitely improved from last week.  There is no drainage.  Impression: Status post right total hip arthroplasty 14 days postop  Plan: At this point time he will finish his antibiotics.  Continue wound care score of hydrocortisone cream around the incision but not over the incision.  Continue with the Bactroban over the incision.  Follow-up with [place] in 2 weeks check his progress lack of sooner if there is any signs of infection.

## 2017-05-18 ENCOUNTER — Telehealth (INDEPENDENT_AMBULATORY_CARE_PROVIDER_SITE_OTHER): Payer: Self-pay | Admitting: Orthopaedic Surgery

## 2017-05-18 NOTE — Telephone Encounter (Signed)
Verbal order given  

## 2017-05-18 NOTE — Telephone Encounter (Signed)
Ana, physical therapist with [facility] requesting verbal orders for patient, CB # [phone]  - 2 more visits

## 2017-05-31 ENCOUNTER — Ambulatory Visit (INDEPENDENT_AMBULATORY_CARE_PROVIDER_SITE_OTHER): Payer: Managed Care, Other (non HMO) | Admitting: Orthopaedic Surgery

## 2017-06-06 ENCOUNTER — Encounter (INDEPENDENT_AMBULATORY_CARE_PROVIDER_SITE_OTHER): Payer: Self-pay | Admitting: Orthopaedic Surgery

## 2017-06-06 ENCOUNTER — Ambulatory Visit (INDEPENDENT_AMBULATORY_CARE_PROVIDER_SITE_OTHER): Payer: Managed Care, Other (non HMO) | Admitting: Orthopaedic Surgery

## 2017-06-06 DIAGNOSIS — Z96641 Presence of right artificial hip joint: Secondary | ICD-10-CM

## 2017-06-06 NOTE — Progress Notes (Signed)
The patient is now 1 month status post a right total hip arthroplasty.  He said he is doing well.  On exam he does have a slight leg length discrepancy with his right slightly longer than the left.  He seems to be tolerating this well.  He is experiencing some left hip pain.  I can put both hips through full range of motion.  He has no difficulty with this.  There is some pain over the lateral area of his left hip.  His right hip incision is healed over completely at this point.  We will have him continue increase his activities as comfort allows.  I am fine with him trying an insert in his left side over-the-counter.  I would like to see him back in 6 weeks I would like a standing low AP pelvis at that visit.

## 2017-06-18 ENCOUNTER — Other Ambulatory Visit: Payer: Self-pay | Admitting: Family Medicine

## 2017-06-18 DIAGNOSIS — K219 Gastro-esophageal reflux disease without esophagitis: Secondary | ICD-10-CM

## 2017-06-18 DIAGNOSIS — E781 Pure hyperglyceridemia: Secondary | ICD-10-CM

## 2017-06-18 DIAGNOSIS — I10 Essential (primary) hypertension: Secondary | ICD-10-CM

## 2017-06-18 DIAGNOSIS — R739 Hyperglycemia, unspecified: Secondary | ICD-10-CM

## 2017-07-18 ENCOUNTER — Encounter (INDEPENDENT_AMBULATORY_CARE_PROVIDER_SITE_OTHER): Payer: Self-pay | Admitting: Orthopaedic Surgery

## 2017-07-18 ENCOUNTER — Ambulatory Visit (INDEPENDENT_AMBULATORY_CARE_PROVIDER_SITE_OTHER): Payer: Managed Care, Other (non HMO)

## 2017-07-18 ENCOUNTER — Ambulatory Visit (INDEPENDENT_AMBULATORY_CARE_PROVIDER_SITE_OTHER): Payer: Managed Care, Other (non HMO) | Admitting: Orthopaedic Surgery

## 2017-07-18 DIAGNOSIS — Z96641 Presence of right artificial hip joint: Secondary | ICD-10-CM

## 2017-07-18 DIAGNOSIS — Z96642 Presence of left artificial hip joint: Secondary | ICD-10-CM

## 2017-07-18 NOTE — Progress Notes (Signed)
The patient is getting close to 3 months status post a right total hip arthroplasty.  He is actually 16 months status post a left total hip arthroplasty.  He does have a leg length discrepancy with his right side slightly longer than the left.  He seems to be compensating well now on his gait.  On examination move both hips around smoothly and easily without any difficulty at all.  There is no significant pain over each incision.  Both knees are stable on exam.  He does have a leg length discrepancy with his right side slightly longer than the left.  An AP pelvis shows well-seated implants bilaterally.  There is a leg length discrepancy with the right longer than left.  At this point he seems to be compensating for things well.  He is back to playing golf and is happy overall.  With that being said we will see him back at the one-year standpoint from this most recent surgery.  At that visit I would like a low AP pelvis.  All questions concerns were answered and addressed.

## 2017-08-03 ENCOUNTER — Other Ambulatory Visit: Payer: Self-pay | Admitting: Family Medicine

## 2017-08-03 DIAGNOSIS — E781 Pure hyperglyceridemia: Secondary | ICD-10-CM

## 2017-08-03 DIAGNOSIS — K219 Gastro-esophageal reflux disease without esophagitis: Secondary | ICD-10-CM

## 2017-08-03 DIAGNOSIS — I10 Essential (primary) hypertension: Secondary | ICD-10-CM

## 2017-08-03 DIAGNOSIS — R739 Hyperglycemia, unspecified: Secondary | ICD-10-CM

## 2017-08-05 ENCOUNTER — Telehealth: Payer: Self-pay | Admitting: Family Medicine

## 2017-08-05 DIAGNOSIS — R739 Hyperglycemia, unspecified: Secondary | ICD-10-CM

## 2017-08-05 DIAGNOSIS — K219 Gastro-esophageal reflux disease without esophagitis: Secondary | ICD-10-CM

## 2017-08-05 DIAGNOSIS — E781 Pure hyperglyceridemia: Secondary | ICD-10-CM

## 2017-08-05 DIAGNOSIS — I10 Essential (primary) hypertension: Secondary | ICD-10-CM

## 2017-08-05 NOTE — Telephone Encounter (Signed)
Copied from [place] [id number]. Topic: Quick Communication - Rx Refill/Question >> Aug 05, 2017  2:15 PM Selinda Flavin B, NT wrote: Medication: hyoscyamine (LEVSIN SL) 0.125 MG SL tablet  Has the patient contacted their pharmacy? Yes.   (Agent: If no, request that the patient contact the pharmacy for the refill.) Preferred Pharmacy (with phone number or street name): [id number] - [place], [address] Agent: Please be advised that RX refills may take up to 3 business days. We ask that you follow-up with your pharmacy.  Patient states the pharmacy had requested this on Wednesday 08/03/17 and has not heard anything back from the pharmacy. Please advise.

## 2017-08-06 NOTE — Telephone Encounter (Signed)
Patient call and says pharmacy requested on 08/03/17  Levsin refill Last OV: 10/12/16 Last Refill:06/20/17 30 tab/0 refills Pharmacy: [id number] - [place], [place] - [address] [phone] (Phone) [phone] (Fax)

## 2017-08-08 MED ORDER — HYOSCYAMINE SULFATE 0.125 MG SL SUBL: 0.1250 mg | SUBLINGUAL_TABLET | SUBLINGUAL | 0 refills | Status: DC | PRN

## 2017-08-08 NOTE — Telephone Encounter (Signed)
meds sent over

## 2017-08-14 ENCOUNTER — Other Ambulatory Visit: Payer: Self-pay | Admitting: Family Medicine

## 2017-09-16 ENCOUNTER — Encounter: Payer: Self-pay | Admitting: Family Medicine

## 2017-09-16 ENCOUNTER — Other Ambulatory Visit: Payer: Self-pay | Admitting: Family Medicine

## 2017-09-16 DIAGNOSIS — E781 Pure hyperglyceridemia: Secondary | ICD-10-CM

## 2017-09-16 DIAGNOSIS — K219 Gastro-esophageal reflux disease without esophagitis: Secondary | ICD-10-CM

## 2017-09-16 DIAGNOSIS — I10 Essential (primary) hypertension: Secondary | ICD-10-CM

## 2017-09-16 DIAGNOSIS — R739 Hyperglycemia, unspecified: Secondary | ICD-10-CM

## 2017-09-16 MED ORDER — HYOSCYAMINE SULFATE 0.125 MG SL SUBL: 0.1250 mg | SUBLINGUAL_TABLET | SUBLINGUAL | 1 refills | Status: DC | PRN

## 2017-09-16 NOTE — Telephone Encounter (Signed)
Copied from [place] [id number]. Topic: Quick Communication - See Telephone Encounter >> Sep 16, 2017  9:56 AM Ether Griffins B wrote: CRM for notification. See Telephone encounter for: 09/16/17.  Pt is checking on status of refill hyoscyamine (LEVSIN SL) 0.125 MG SL tablet. He states the pharmacy has reached out twice on  the refill request. He is going out of town tomorrow. Please send to [id number] - [place], [place] - [address]

## 2017-09-16 NOTE — Progress Notes (Signed)
Author phoned pt to relay hycosamine rx refill, and to set up a CPE with Dr. Charlett Blake, as pt. has not been seen by Dr. Charlett Blake in the past year. Pt. thought he had made an appointment, but none with Dr. Charlett Blake found. Pt.sttated he was not sure about his schedule, but he will call next week to set up a physical.

## 2017-10-19 ENCOUNTER — Telehealth (INDEPENDENT_AMBULATORY_CARE_PROVIDER_SITE_OTHER): Payer: Self-pay | Admitting: Orthopaedic Surgery

## 2017-10-19 NOTE — Telephone Encounter (Signed)
Faxed note to provided number

## 2017-10-19 NOTE — Telephone Encounter (Signed)
Gabriel [name] w/ Dr.Haviland calling-pt is there now and needs note if requires pre medications before dental work. [phone]. Ph [phone]

## 2017-11-10 ENCOUNTER — Telehealth: Payer: Self-pay | Admitting: Family Medicine

## 2017-11-14 NOTE — Telephone Encounter (Signed)
Pt due for follow up please call and schedule appointment.  

## 2017-11-14 NOTE — Telephone Encounter (Signed)
Left voicemail for pt to call us back to schedule appt for followup.

## 2017-11-17 ENCOUNTER — Other Ambulatory Visit: Payer: Self-pay | Admitting: Family Medicine

## 2017-11-17 DIAGNOSIS — K220 Achalasia of cardia: Secondary | ICD-10-CM

## 2017-11-17 DIAGNOSIS — K2100 Gastro-esophageal reflux disease with esophagitis, without bleeding: Secondary | ICD-10-CM

## 2017-11-17 DIAGNOSIS — R1310 Dysphagia, unspecified: Secondary | ICD-10-CM

## 2017-11-17 DIAGNOSIS — K222 Esophageal obstruction: Secondary | ICD-10-CM

## 2017-12-14 ENCOUNTER — Other Ambulatory Visit: Payer: Self-pay | Admitting: Family Medicine

## 2017-12-19 ENCOUNTER — Encounter: Payer: Self-pay | Admitting: Family Medicine

## 2017-12-19 ENCOUNTER — Ambulatory Visit (INDEPENDENT_AMBULATORY_CARE_PROVIDER_SITE_OTHER): Payer: Managed Care, Other (non HMO) | Admitting: Family Medicine

## 2017-12-19 VITALS — BP 126/76 | HR 86 | Temp 98.2°F | Resp 18 | Ht 66.0 in | Wt 191.2 lb

## 2017-12-19 DIAGNOSIS — R3911 Hesitancy of micturition: Secondary | ICD-10-CM

## 2017-12-19 DIAGNOSIS — R972 Elevated prostate specific antigen [PSA]: Secondary | ICD-10-CM

## 2017-12-19 DIAGNOSIS — Z0000 Encounter for general adult medical examination without abnormal findings: Secondary | ICD-10-CM

## 2017-12-19 DIAGNOSIS — N400 Enlarged prostate without lower urinary tract symptoms: Secondary | ICD-10-CM

## 2017-12-19 DIAGNOSIS — E782 Mixed hyperlipidemia: Secondary | ICD-10-CM | POA: Diagnosis not present

## 2017-12-19 DIAGNOSIS — K222 Esophageal obstruction: Secondary | ICD-10-CM

## 2017-12-19 DIAGNOSIS — Z23 Encounter for immunization: Secondary | ICD-10-CM | POA: Diagnosis not present

## 2017-12-19 DIAGNOSIS — R739 Hyperglycemia, unspecified: Secondary | ICD-10-CM

## 2017-12-19 DIAGNOSIS — I10 Essential (primary) hypertension: Secondary | ICD-10-CM | POA: Diagnosis not present

## 2017-12-19 LAB — CBC
HCT: 45.9 % (ref 39.0–52.0)
Hemoglobin: 15.8 g/dL (ref 13.0–17.0)
MCHC: 34.4 g/dL (ref 30.0–36.0)
MCV: 93.6 fl (ref 78.0–100.0)
Platelets: 296 10*3/uL (ref 150.0–400.0)
RBC: 4.9 Mil/uL (ref 4.22–5.81)
RDW: 12.8 % (ref 11.5–15.5)
WBC: 5.3 10*3/uL (ref 4.0–10.5)

## 2017-12-19 LAB — COMPREHENSIVE METABOLIC PANEL
ALBUMIN: 4.4 g/dL (ref 3.5–5.2)
ALK PHOS: 63 U/L (ref 39–117)
ALT: 28 U/L (ref 0–53)
AST: 18 U/L (ref 0–37)
BUN: 8 mg/dL (ref 6–23)
CHLORIDE: 96 meq/L (ref 96–112)
CO2: 29 mEq/L (ref 19–32)
Calcium: 9.4 mg/dL (ref 8.4–10.5)
Creatinine, Ser: 0.84 mg/dL (ref 0.40–1.50)
GFR: 97.17 mL/min (ref >60.00)
Glucose, Bld: 98 mg/dL (ref 70–99)
POTASSIUM: 4.4 meq/L (ref 3.5–5.1)
SODIUM: 133 meq/L — AB (ref 135–145)
TOTAL PROTEIN: 6.8 g/dL (ref 6.0–8.3)
Total Bilirubin: 1 mg/dL (ref 0.2–1.2)

## 2017-12-19 LAB — URINALYSIS
Bilirubin Urine: NEGATIVE
Hgb urine dipstick: NEGATIVE
KETONES UR: NEGATIVE
LEUKOCYTES UA: NEGATIVE
NITRITE: NEGATIVE
SPECIFIC GRAVITY, URINE: 1.01 (ref 1.000–1.030)
Total Protein, Urine: NEGATIVE
Urine Glucose: NEGATIVE
Urobilinogen, UA: 0.2 (ref 0.0–1.0)
pH: 7 (ref 5.0–8.0)

## 2017-12-19 LAB — LIPID PANEL
CHOLESTEROL: 198 mg/dL (ref 0–200)
HDL: 54.4 mg/dL (ref >39.00)
LDL Cholesterol: 123 mg/dL — ABNORMAL HIGH (ref 0–99)
NonHDL: 143.9
Total CHOL/HDL Ratio: 4
Triglycerides: 107 mg/dL (ref 0.0–149.0)
VLDL: 21.4 mg/dL (ref 0.0–40.0)

## 2017-12-19 LAB — PSA: PSA: 6.2 ng/mL — ABNORMAL HIGH (ref 0.10–4.00)

## 2017-12-19 LAB — MICROALBUMIN / CREATININE URINE RATIO
CREATININE, U: 60.7 mg/dL
MICROALB/CREAT RATIO: 1.2 mg/g (ref 0.0–30.0)
Microalb, Ur: <0.7 mg/dL (ref 0.0–1.9)

## 2017-12-19 LAB — HEMOGLOBIN A1C: HEMOGLOBIN A1C: 5.7 % (ref 4.6–6.5)

## 2017-12-19 LAB — TSH: TSH: 1.03 u[IU]/mL (ref 0.35–4.50)

## 2017-12-19 MED ORDER — HYDROCHLOROTHIAZIDE 12.5 MG PO TABS: ORAL_TABLET | ORAL | 3 refills | Status: DC

## 2017-12-19 MED ORDER — ENALAPRIL MALEATE 10 MG PO TABS: 10.0000 mg | ORAL_TABLET | Freq: Every day | ORAL | 3 refills | Status: DC

## 2017-12-19 NOTE — Assessment & Plan Note (Addendum)
Patient encouraged to maintain heart healthy diet, regular exercise, adequate sleep. Consider daily probiotics. Take medications as prescribed. Labs reviewed. prevnar

## 2017-12-19 NOTE — Patient Instructions (Signed)
Shingrix is the new shingles shot 2 shots over 2-6 months call insurance and confirm coverage then ca Preventive Care 40-64 Years, Male Preventive care refers to lifestyle choices and visits with your health care provider that can promote health and wellness. What does preventive care include?  A yearly physical exam. This is also called an annual well check.  Dental exams once or twice a year.  Routine eye exams. Ask your health care provider how often you should have your eyes checked.  Personal lifestyle choices, including: ? Daily care of your teeth and gums. ? Regular physical activity. ? Eating a healthy diet. ? Avoiding tobacco and drug use. ? Limiting alcohol use. ? Practicing safe sex. ? Taking low-dose aspirin every day starting at age 8. What happens during an annual well check? The services and screenings done by your health care provider during your annual well check will depend on your age, overall health, lifestyle risk factors, and family history of disease. Counseling Your health care provider may ask you questions about your:  Alcohol use.  Tobacco use.  Drug use.  Emotional well-being.  Home and relationship well-being.  Sexual activity.  Eating habits.  Work and work Statistician.  Screening You may have the following tests or measurements:  Height, weight, and BMI.  Blood pressure.  Lipid and cholesterol levels. These may be checked every 5 years, or more frequently if you are over 76 years old.  Skin check.  Lung cancer screening. You may have this screening every year starting at age 65 if you have a 30-pack-year history of smoking and currently smoke or have quit within the past 15 years.  Fecal occult blood test (FOBT) of the stool. You may have this test every year starting at age 83.  Flexible sigmoidoscopy or colonoscopy. You may have a sigmoidoscopy every 5 years or a colonoscopy every 10 years starting at age 1.  Prostate cancer  screening. Recommendations will vary depending on your family history and other risks.  Hepatitis C blood test.  Hepatitis B blood test.  Sexually transmitted disease (STD) testing.  Diabetes screening. This is done by checking your blood sugar (glucose) after you have not eaten for a while (fasting). You may have this done every 1-3 years.  Discuss your test results, treatment options, and if necessary, the need for more tests with your health care provider. Vaccines Your health care provider may recommend certain vaccines, such as:  Influenza vaccine. This is recommended every year.  Tetanus, diphtheria, and acellular pertussis (Tdap, Td) vaccine. You may need a Td booster every 10 years.  Varicella vaccine. You may need this if you have not been vaccinated.  Zoster vaccine. You may need this after age 66.  Measles, mumps, and rubella (MMR) vaccine. You may need at least one dose of MMR if you were born in 1957 or later. You may also need a second dose.  Pneumococcal 13-valent conjugate (PCV13) vaccine. You may need this if you have certain conditions and have not been vaccinated.  Pneumococcal polysaccharide (PPSV23) vaccine. You may need one or two doses if you smoke cigarettes or if you have certain conditions.  Meningococcal vaccine. You may need this if you have certain conditions.  Hepatitis A vaccine. You may need this if you have certain conditions or if you travel or work in places where you may be exposed to hepatitis A.  Hepatitis B vaccine. You may need this if you have certain conditions or if you travel or  work in places where you may be exposed to hepatitis B.  Haemophilus influenzae type b (Hib) vaccine. You may need this if you have certain risk factors.  Talk to your health care provider about which screenings and vaccines you need and how often you need them. This information is not intended to replace advice given to you by your health care provider. Make  sure you discuss any questions you have with your health care provider. Document Released: 04/25/2015 Document Revised: 12/17/2015 Document Reviewed: 01/28/2015 Elsevier Interactive Patient Education  [employer].

## 2017-12-19 NOTE — Progress Notes (Signed)
Subjective:  I acted as a Education administrator for Dr. Charlett Blake. [place], [place]  Patient ID: Gabriel [name], male    DOB: October 05, 1950, 67 y.o.   MRN: [id number]  No chief complaint on file.   HPI  Patient is in today for an annual exam and follow up on chronic medical concerns including hypertension, hyperlipidemia and hyperglycemia. No recent febrile illness or hospitalizations. No polyuria or polydipsia. Notes some mild urinary hesitancy at night. No dysuria. Stays active and maintains a heart healthy diet. No trouble with ADLs. Has recurrent strictures, has an appointment at [facility] with GI to discuss whether or not to stretch his esopahus again. The Hyoscyamine helps when he needs it. Denies CP/palp/SOB/HA/congestion/fevers/GI or GU c/o. Taking meds as prescribed  Patient Care Team: Mosie Lukes, MD as PCP - General (Family Medicine)   Past Medical History:  Diagnosis Date  . Achalasia   . Arthritis   . Colon cancer screening 08/11/2014   Sees [facility], Dr Carlean Purl   . Colon polyp 08/11/2014   Sees [facility], Dr Carlean Purl    . Eczema 01/09/2014  . Esophageal reflux 05/21/2012  . Hyperlipidemia   . Hypertension   . Insomnia 01/09/2014  . Left hip pain 08/11/2014  . OA (osteoarthritis) of hip    right  . Otitis, externa, infective 03/31/2015  . Overweight 11/14/2012  . Right hip pain 08/11/2014   Sees chiropractor, Dr Clovis Riley in [place]   . Skin lesion of left leg 10/07/2015  . Tinnitus 10/12/2016  . Ulcer    esophageal ulcer hx  . Wears contact lenses     Past Surgical History:  Procedure Laterality Date  . COLONOSCOPY    . ESOPHAGOGASTRODUODENOSCOPY    . HELLER MYOTOMY  1999   help with swallowing  . TONSILLECTOMY    . TONSILLECTOMY AND ADENOIDECTOMY  1963  . 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 TOTAL HIP ARTHROPLASTY Right 05/03/2017   Procedure: RIGHT  TOTAL HIP ARTHROPLASTY ANTERIOR APPROACH;  Surgeon: Mcarthur Rossetti, MD;  Location: [place];  Service: Orthopedics;  Laterality: Right;  . UPPER GASTROINTESTINAL ENDOSCOPY      Family History  Problem Relation Age of Onset  . Alcohol abuse Mother   . Ovarian cancer Mother   . Diabetes Maternal Grandmother   . Alcohol abuse Father   . Lung cancer Father   . Mental illness Sister   . Depression Sister   . Heart disease Brother        congenital heart  . Arthritis Sister   . Hypertension Sister   . Diverticulosis Sister   . Healthy Sister   . Healthy Sister   . Healthy Sister   . Colon cancer Neg Hx   . Esophageal cancer Neg Hx   . Rectal cancer Neg Hx   . Stomach cancer Neg Hx     Social History   Socioeconomic History  . Marital status: Married    Spouse name: Not on file  . Number of children: 2  . Years of education: Not on file  . Highest education level: Not on file  Occupational History  . Occupation: sales/distribution    Employer: [employer]  Social Needs  . Financial resource strain: Not on file  . Food insecurity:    Worry: Not on file    Inability: Not on file  .  Transportation needs:    Medical: Not on file    Non-medical: Not on file  Tobacco Use  . Smoking status: Former Smoker    Types: Cigarettes    Last attempt to quit: 04/13/1991    Years since quitting: 26.7  . Smokeless tobacco: Never Used  . Tobacco comment: 1 ppd for 25 years quit 41'  Substance and Sexual Activity  . Alcohol use: Yes    Alcohol/week: 7.0 standard drinks    Types: 7 Glasses of wine per week    Comment: Wine nightly  . Drug use: No  . Sexual activity: Not on file  Lifestyle  . Physical activity:    Days per week: Not on file    Minutes per session: Not on file  . Stress: Not on file  Relationships  . Social connections:    Talks on phone: Not on file    Gets together: Not on file    Attends religious service: Not on file    Active member of  club or organization: Not on file    Attends meetings of clubs or organizations: Not on file    Relationship status: Not on file  . Intimate partner violence:    Fear of current or ex partner: Not on file    Emotionally abused: Not on file    Physically abused: Not on file    Forced sexual activity: Not on file  Other Topics Concern  . Not on file  Social History Narrative   Married 2 daughters   Scientific laboratory technician for [employer]          Outpatient Medications Prior to Visit  Medication Sig Dispense Refill  . hyoscyamine (LEVSIN SL) 0.125 MG SL tablet Place 1 tablet (0.125 mg total) under the tongue every 4 (four) hours as needed. 30 tablet 1  . Multiple Vitamins-Minerals (ANTIOXIDANT VITAMINS) TABS Take 1 tablet by mouth daily. Life Guard    . naproxen sodium (ANAPROX) 220 MG tablet Take 220 mg by mouth 3 (three) times daily as needed (pain).     Marland Kitchen omeprazole (PRILOSEC) 40 MG capsule TAKE 1 CAPSULE BY MOUTH EVERY DAY 90 capsule 1  . pimecrolimus (ELIDEL) 1 % cream Apply 1 application topically daily. (Patient taking differently: Apply 1 application topically 3 (three) times daily as needed (rash). ) 60 g 5  . Red Yeast Rice Extract 600 MG TABS Take 1 tablet by mouth daily.     . TURMERIC PO Take 1 capsule by mouth daily.    . enalapril (VASOTEC) 10 MG tablet TAKE 1 TABLET BY MOUTH EVERY DAY 30 tablet 0  . hydrochlorothiazide (HYDRODIURIL) 12.5 MG tablet TAKE 1 TABLET (12.5 MG TOTAL) BY MOUTH DAILY. 30 tablet 6  . aspirin 81 MG chewable tablet Chew 1 tablet (81 mg total) by mouth 2 (two) times daily. 60 tablet 0  . etodolac (LODINE) 500 MG tablet TAKE 1 TABLET BY MOUTH TWICE A DAY (Patient not taking: Reported on 12/19/2017) 60 tablet 0  . methocarbamol (ROBAXIN) 500 MG tablet Take 1 tablet (500 mg total) by mouth every 6 (six) hours as needed for muscle spasms. 60 tablet 0  . mupirocin ointment (BACTROBAN) 2 % Apply to b/l nares qhs x 7 days then as needed 22 g 1  .  oxyCODONE-acetaminophen (PERCOCET) 5-325 MG tablet Take 1-2 tablets by mouth every 4 (four) hours as needed for severe pain. 60 tablet 0   No facility-administered medications prior to visit.  No Known Allergies  Review of Systems  Constitutional: Negative for chills, fever and malaise/fatigue.  HENT: Negative for congestion and hearing loss.   Eyes: Negative for discharge.  Respiratory: Negative for cough, sputum production and shortness of breath.   Cardiovascular: Negative for chest pain, palpitations and leg swelling.  Gastrointestinal: Negative for abdominal pain, blood in stool, constipation, diarrhea, heartburn, nausea and vomiting.  Genitourinary: Negative for dysuria, frequency, hematuria and urgency.  Musculoskeletal: Negative for back pain, falls and myalgias.  Skin: Negative for rash.  Neurological: Negative for dizziness, sensory change, loss of consciousness, weakness and headaches.  Endo/Heme/Allergies: Negative for environmental allergies. Does not bruise/bleed easily.  Psychiatric/Behavioral: Negative for depression and suicidal ideas. The patient is not nervous/anxious and does not have insomnia.        Objective:    Physical Exam  Constitutional: He is oriented to person, place, and time. He appears well-developed and well-nourished. No distress.  HENT:  Head: Normocephalic and atraumatic.  Nose: Nose normal.  Eyes: Right eye exhibits no discharge. Left eye exhibits no discharge.  Neck: Normal range of motion. Neck supple.  Cardiovascular: Normal rate and regular rhythm.  No murmur heard. Pulmonary/Chest: Effort normal and breath sounds normal.  Abdominal: Soft. Bowel sounds are normal. There is no tenderness.  Musculoskeletal: He exhibits no edema.  Neurological: He is alert and oriented to person, place, and time.  Skin: Skin is warm and dry.  Psychiatric: He has a normal mood and affect.  Nursing note and vitals reviewed.   BP 126/76 (BP Location:  Left Arm, Patient Position: Sitting, Cuff Size: Normal)   Pulse 86   Temp 98.2 F (36.8 C) (Oral)   Resp 18   Ht 5\' 6"  (1.676 m)   Wt 191 lb 3.2 oz (86.7 kg)   SpO2 98%   BMI 30.86 kg/m  Wt Readings from Last 3 Encounters:  12/19/17 191 lb 3.2 oz (86.7 kg)  05/03/17 195 lb (88.5 kg)  04/26/17 195 lb 3.2 oz (88.5 kg)   BP Readings from Last 3 Encounters:  12/19/17 126/76  05/05/17 137/75  04/26/17 (!) 145/82     Immunization History  Administered Date(s) Administered  . Influenza, High Dose Seasonal PF 12/19/2017  . Influenza,inj,Quad PF,6+ Mos 03/31/2015, 03/17/2016  . Influenza-Unspecified 03/31/2015  . Tdap 04/15/2008  . Zoster 12/03/2015    Health Maintenance  Topic Date Due  . Hepatitis C Screening  1950-12-20  . HIV Screening  12/26/1966  . PNA vac Low Risk Adult (1 of 2 - PCV13) 12/25/2016  . INFLUENZA VACCINE  11/10/2017  . TETANUS/TDAP  04/15/2018  . COLONOSCOPY  01/31/2023    Lab Results  Component Value Date   WBC 11.2 (H) 05/05/2017   HGB 11.0 (L) 05/05/2017   HCT 31.9 (L) 05/05/2017   PLT 221 05/05/2017   GLUCOSE 148 (H) 05/04/2017   CHOL 186 10/05/2016   TRIG 91.0 10/05/2016   HDL 57.40 10/05/2016   LDLCALC 110 (H) 10/05/2016   ALT 18 10/05/2016   AST 18 10/05/2016   NA 128 (L) 05/04/2017   K 3.6 05/04/2017   CL 94 (L) 05/04/2017   CREATININE 0.78 05/04/2017   BUN 9 05/04/2017   CO2 24 05/04/2017   TSH 0.76 10/05/2016   PSA 3.27 10/02/2015   HGBA1C 5.7 10/05/2016    Lab Results  Component Value Date   TSH 0.76 10/05/2016   Lab Results  Component Value Date   WBC 11.2 (H) 05/05/2017   HGB 11.0 (L)  05/05/2017   HCT 31.9 (L) 05/05/2017   MCV 92.2 05/05/2017   PLT 221 05/05/2017   Lab Results  Component Value Date   NA 128 (L) 05/04/2017   K 3.6 05/04/2017   CO2 24 05/04/2017   GLUCOSE 148 (H) 05/04/2017   BUN 9 05/04/2017   CREATININE 0.78 05/04/2017   BILITOT 0.6 10/05/2016   ALKPHOS 53 10/05/2016   AST 18 10/05/2016    ALT 18 10/05/2016   PROT 6.6 10/05/2016   ALBUMIN 4.4 10/05/2016   CALCIUM 8.0 (L) 05/04/2017   ANIONGAP 10 05/04/2017   GFR 97.54 10/05/2016   Lab Results  Component Value Date   CHOL 186 10/05/2016   Lab Results  Component Value Date   HDL 57.40 10/05/2016   Lab Results  Component Value Date   LDLCALC 110 (H) 10/05/2016   Lab Results  Component Value Date   TRIG 91.0 10/05/2016   Lab Results  Component Value Date   CHOLHDL 3 10/05/2016   Lab Results  Component Value Date   HGBA1C 5.7 10/05/2016         Assessment & Plan:   Problem List Items Addressed This Visit    HTN (hypertension)    Well controlled, no changes to meds. Encouraged heart healthy diet such as the DASH diet and exercise as tolerated.       Relevant Medications   enalapril (VASOTEC) 10 MG tablet   hydrochlorothiazide (HYDRODIURIL) 12.5 MG tablet   Other Relevant Orders   CBC   Comprehensive metabolic panel   TSH   Preventative health care    Patient encouraged to maintain heart healthy diet, regular exercise, adequate sleep. Consider daily probiotics. Take medications as prescribed. Labs reviewed. prevnar      Hyperlipidemia    Encouraged heart healthy diet, increase exercise, avoid trans fats, consider a krill oil cap daily      Relevant Medications   enalapril (VASOTEC) 10 MG tablet   hydrochlorothiazide (HYDRODIURIL) 12.5 MG tablet   Other Relevant Orders   Lipid panel   Enlarged prostate    Check a PSA today, some hesitancy.       Relevant Orders   PSA   Esophageal stricture    Has recurrent strictures, has an appointment at [facility] with GI to discuss whether or not to stretch his esopahus again. The Hyoscyamine helps when he needs it.      Hyperglycemia    hgba1c acceptable, minimize simple carbs. Increase exercise as tolerated.       Relevant Orders   Hemoglobin A1c    Other Visit Diagnoses    Needs flu shot    -  Primary   Relevant Orders   Flu vaccine HIGH DOSE PF  (Fluzone High dose) (Completed)      I have discontinued Gabriel [name]'s mupirocin ointment, etodolac, methocarbamol, aspirin, and oxyCODONE-acetaminophen. I have also changed his enalapril. Additionally, I am having him maintain his Red Yeast Rice Extract, pimecrolimus, ANTIOXIDANT VITAMINS, naproxen sodium, TURMERIC PO, hyoscyamine, omeprazole, and hydrochlorothiazide.  Meds ordered this encounter  Medications  . enalapril (VASOTEC) 10 MG tablet    Sig: Take 1 tablet (10 mg total) by mouth daily.    Dispense:  90 tablet    Refill:  3  . hydrochlorothiazide (HYDRODIURIL) 12.5 MG tablet    Sig: TAKE 1 TABLET (12.5 MG TOTAL) BY MOUTH DAILY.    Dispense:  90 tablet    Refill:  3    CMA served as scribe during this visit.  History, Physical and Plan performed by medical provider. Documentation and orders reviewed and attested to.  Penni Homans, MD

## 2017-12-19 NOTE — Assessment & Plan Note (Signed)
Check a PSA today, some hesitancy.

## 2017-12-19 NOTE — Addendum Note (Signed)
Addended by: Magdalene Molly A on: 12/19/2017 08:38 AM   Modules accepted: Orders

## 2017-12-19 NOTE — Assessment & Plan Note (Signed)
Has recurrent strictures, has an appointment at [facility] with GI to discuss whether or not to stretch his esopahus again. The Hyoscyamine helps when he needs it.

## 2017-12-19 NOTE — Assessment & Plan Note (Signed)
hgba1c acceptable, minimize simple carbs. Increase exercise as tolerated.  

## 2017-12-19 NOTE — Assessment & Plan Note (Signed)
Encouraged heart healthy diet, increase exercise, avoid trans fats, consider a krill oil cap daily 

## 2017-12-19 NOTE — Assessment & Plan Note (Signed)
Well controlled, no changes to meds. Encouraged heart healthy diet such as the DASH diet and exercise as tolerated.  °

## 2017-12-20 NOTE — Addendum Note (Signed)
Addended by: Magdalene Molly A on: 12/20/2017 08:25 AM   Modules accepted: Orders

## 2018-01-06 ENCOUNTER — Other Ambulatory Visit: Payer: Self-pay | Admitting: Family Medicine

## 2018-01-06 DIAGNOSIS — E781 Pure hyperglyceridemia: Secondary | ICD-10-CM

## 2018-01-06 DIAGNOSIS — K219 Gastro-esophageal reflux disease without esophagitis: Secondary | ICD-10-CM

## 2018-01-06 DIAGNOSIS — R739 Hyperglycemia, unspecified: Secondary | ICD-10-CM

## 2018-01-06 DIAGNOSIS — I10 Essential (primary) hypertension: Secondary | ICD-10-CM

## 2018-01-06 MED ORDER — HYOSCYAMINE SULFATE 0.125 MG SL SUBL: 0.1250 mg | SUBLINGUAL_TABLET | SUBLINGUAL | 3 refills | Status: DC | PRN

## 2018-01-06 NOTE — Telephone Encounter (Signed)
Rx sent 

## 2018-01-06 NOTE — Telephone Encounter (Signed)
Hyoscyamine refill Last Refill:09/16/17 # 30 Last OV: 12/19/17 PCP: Dr Penni Homans Pharmacy: [facility] [address] [place], [place]

## 2018-01-06 NOTE — Telephone Encounter (Signed)
Copied from [place] [phone]. Topic: Quick Communication - Rx Refill/Question >> Jan 06, 2018 10:21 AM Scherrie Gerlach wrote: Medication: hyoscyamine (LEVSIN SL) 0.125 MG SL tablet Pt states he is on his last one and not swallowing very well.  Pt states he called his pharmacy yesterday. Was hoping to get this today.  [id number] - [place], [place] - [address] [phone] (Phone) [phone] (Fax)

## 2018-02-24 ENCOUNTER — Telehealth: Payer: Self-pay | Admitting: Family Medicine

## 2018-02-24 NOTE — Telephone Encounter (Signed)
Copied from [place] [id number]. Topic: Quick Communication - See Telephone Encounter >> Feb 24, 2018 11:16 AM Blase Mess A wrote: CRM for notification. See Telephone encounter for: 02/24/18.  Patient is calling to request antibiotic for a cold that he has. Patient states that he is unable to come into the office for an office visit. Please advise [phone]

## 2018-02-27 ENCOUNTER — Ambulatory Visit (INDEPENDENT_AMBULATORY_CARE_PROVIDER_SITE_OTHER): Payer: Managed Care, Other (non HMO) | Admitting: Family Medicine

## 2018-02-27 ENCOUNTER — Encounter: Payer: Self-pay | Admitting: Family Medicine

## 2018-02-27 VITALS — BP 136/75 | HR 92 | Temp 99.1°F | Resp 16 | Ht 66.0 in | Wt 187.6 lb

## 2018-02-27 DIAGNOSIS — J0140 Acute pansinusitis, unspecified: Secondary | ICD-10-CM | POA: Insufficient documentation

## 2018-02-27 MED ORDER — AMOXICILLIN-POT CLAVULANATE 875-125 MG PO TABS: 1.0000 | ORAL_TABLET | Freq: Two times a day (BID) | ORAL | 0 refills | Status: DC

## 2018-02-27 NOTE — Telephone Encounter (Signed)
Please advise 

## 2018-02-27 NOTE — Progress Notes (Signed)
Patient ID: Gabriel [name], male    DOB: April 02, 1951  Age: 67 y.o. MRN: [id number]    Subjective:  Subjective  HPI [name] presents for cough and congestion x 3 weeks.  His wife was sick as well but she got better.  No fever + sinus ha / pressure + productive cough   Review of Systems  Constitutional: Negative for chills and fever.  HENT: Positive for congestion, postnasal drip, rhinorrhea and sinus pressure.   Respiratory: Positive for cough. Negative for chest tightness, shortness of breath and wheezing.   Cardiovascular: Negative for chest pain, palpitations and leg swelling.  Allergic/Immunologic: Negative for environmental allergies.    History Past Medical History:  Diagnosis Date  . Achalasia   . Arthritis   . Colon cancer screening 08/11/2014   Sees [facility], Dr Carlean Purl   . Colon polyp 08/11/2014   Sees [facility], Dr Carlean Purl    . Eczema 01/09/2014  . Esophageal reflux 05/21/2012  . Hyperlipidemia   . Hypertension   . Insomnia 01/09/2014  . Left hip pain 08/11/2014  . OA (osteoarthritis) of hip    right  . Otitis, externa, infective 03/31/2015  . Overweight 11/14/2012  . Right hip pain 08/11/2014   Sees chiropractor, Dr Clovis Riley in [place]   . Skin lesion of left leg 10/07/2015  . Tinnitus 10/12/2016  . Ulcer    esophageal ulcer hx  . Wears contact lenses     He has a past surgical history that includes Heller myotomy (1999); Tonsillectomy and adenoidectomy (1963); Colonoscopy; Esophagogastroduodenoscopy; Upper gastrointestinal endoscopy; Tonsillectomy; Total hip arthroplasty (Left, 03/16/2016); and Total hip arthroplasty (Right, 05/03/2017).   His family history includes Alcohol abuse in his father and mother; Arthritis in his sister; Depression in his sister; Diabetes in his maternal grandmother; Diverticulosis in his sister; Healthy in his sister, sister, and sister; Heart disease in his brother; Hypertension in his sister; Lung cancer in his father; Mental illness in his sister;  Ovarian cancer in his mother.He reports that he quit smoking about 26 years ago. His smoking use included cigarettes. He has never used smokeless tobacco. He reports that he drinks about 7.0 standard drinks of alcohol per week. He reports that he does not use drugs.  Current Outpatient Medications on File Prior to Visit  Medication Sig Dispense Refill  . enalapril (VASOTEC) 10 MG tablet Take 1 tablet (10 mg total) by mouth daily. 90 tablet 3  . hydrochlorothiazide (HYDRODIURIL) 12.5 MG tablet TAKE 1 TABLET (12.5 MG TOTAL) BY MOUTH DAILY. 90 tablet 3  . hyoscyamine (LEVSIN SL) 0.125 MG SL tablet Place 1 tablet (0.125 mg total) under the tongue every 4 (four) hours as needed. 30 tablet 3  . Multiple Vitamins-Minerals (ANTIOXIDANT VITAMINS) TABS Take 1 tablet by mouth daily. Life Guard    . naproxen sodium (ANAPROX) 220 MG tablet Take 220 mg by mouth 3 (three) times daily as needed (pain).     Marland Kitchen omeprazole (PRILOSEC) 40 MG capsule TAKE 1 CAPSULE BY MOUTH EVERY DAY 90 capsule 1  . pimecrolimus (ELIDEL) 1 % cream Apply 1 application topically daily. (Patient taking differently: Apply 1 application topically 3 (three) times daily as needed (rash). ) 60 g 5  . pyridostigmine (MESTINON) 60 MG/5ML solution Take 2.5 mLs by mouth 3 (three) times daily.  12  . Red Yeast Rice Extract 600 MG TABS Take 1 tablet by mouth daily.     . TURMERIC PO Take 1 capsule by mouth daily.     No current  facility-administered medications on file prior to visit.      Objective:  Objective  Physical Exam  Constitutional: He is oriented to person, place, and time. Vital signs are normal. He appears well-developed and well-nourished. He is sleeping.  HENT:  Head: Normocephalic and atraumatic.  Right Ear: External ear normal.  Left Ear: External ear normal.  Nose: Right sinus exhibits maxillary sinus tenderness and frontal sinus tenderness. Left sinus exhibits maxillary sinus tenderness and frontal sinus tenderness.    Mouth/Throat: Oropharynx is clear and moist.  + PND + errythema  Eyes: Pupils are equal, round, and reactive to light. Conjunctivae and EOM are normal. Right eye exhibits no discharge. Left eye exhibits no discharge.  Neck: Normal range of motion. Neck supple. No thyromegaly present.  Cardiovascular: Normal rate, regular rhythm and normal heart sounds.  No murmur heard. Pulmonary/Chest: Effort normal and breath sounds normal. No respiratory distress. He has no wheezes. He has no rales. He exhibits no tenderness.  Musculoskeletal: He exhibits no edema or tenderness.  Lymphadenopathy:    He has cervical adenopathy.  Neurological: He is alert and oriented to person, place, and time.  Skin: Skin is warm and dry.  Psychiatric: He has a normal mood and affect. His behavior is normal. Judgment and thought content normal.  Nursing note and vitals reviewed.  BP 136/75 (BP Location: Right Arm, Cuff Size: Large)   Pulse 92   Temp 99.1 F (37.3 C) (Oral)   Resp 16   Ht 5\' 6"  (1.676 m)   Wt 187 lb 9.6 oz (85.1 kg)   SpO2 99%   BMI 30.28 kg/m  Wt Readings from Last 3 Encounters:  02/27/18 187 lb 9.6 oz (85.1 kg)  12/19/17 191 lb 3.2 oz (86.7 kg)  05/03/17 195 lb (88.5 kg)     Lab Results  Component Value Date   WBC 5.3 12/19/2017   HGB 15.8 12/19/2017   HCT 45.9 12/19/2017   PLT 296.0 12/19/2017   GLUCOSE 98 12/19/2017   CHOL 198 12/19/2017   TRIG 107.0 12/19/2017   HDL 54.40 12/19/2017   LDLCALC 123 (H) 12/19/2017   ALT 28 12/19/2017   AST 18 12/19/2017   NA 133 (L) 12/19/2017   K 4.4 12/19/2017   CL 96 12/19/2017   CREATININE 0.84 12/19/2017   BUN 8 12/19/2017   CO2 29 12/19/2017   TSH 1.03 12/19/2017   PSA 6.20 (H) 12/19/2017   HGBA1C 5.7 12/19/2017   MICROALBUR <0.7 12/19/2017    No results found.   Assessment & Plan:  Plan  I am having [name] start on amoxicillin-clavulanate. I am also having him maintain his Red Yeast Rice Extract, pimecrolimus,  ANTIOXIDANT VITAMINS, naproxen sodium, TURMERIC PO, omeprazole, enalapril, hydrochlorothiazide, hyoscyamine, and pyridostigmine.  Meds ordered this encounter  Medications  . amoxicillin-clavulanate (AUGMENTIN) 875-125 MG tablet    Sig: Take 1 tablet by mouth 2 (two) times daily.    Dispense:  20 tablet    Refill:  0    Problem List Items Addressed This Visit      Unprioritized   Acute pansinusitis - Primary    con't saline and robitussin abx per orders rto prn       Relevant Medications   amoxicillin-clavulanate (AUGMENTIN) 875-125 MG tablet       Warned pt to not take decongestants ---  It can inc bp and cause palpitations   Follow-up: Return if symptoms worsen or fail to improve.  Ann Held, DO

## 2018-02-27 NOTE — Patient Instructions (Signed)

## 2018-02-27 NOTE — Assessment & Plan Note (Signed)
con't saline and robitussin abx per orders rto prn

## 2018-02-27 NOTE — Telephone Encounter (Signed)
Need to know more. How long has he been sick, what has he tried, is he getting worse, what are his symptoms then we can decide if antibiotics might be warranted please check with him.

## 2018-02-28 NOTE — Telephone Encounter (Signed)
Pt. Had OV with Dr. Etter Sjogren 11/18, prescribed abx for sinusitis.

## 2018-04-14 ENCOUNTER — Other Ambulatory Visit: Payer: Self-pay | Admitting: Family Medicine

## 2018-04-14 DIAGNOSIS — K222 Esophageal obstruction: Secondary | ICD-10-CM

## 2018-04-14 DIAGNOSIS — R1310 Dysphagia, unspecified: Secondary | ICD-10-CM

## 2018-04-14 DIAGNOSIS — K220 Achalasia of cardia: Secondary | ICD-10-CM

## 2018-04-14 DIAGNOSIS — K2100 Gastro-esophageal reflux disease with esophagitis, without bleeding: Secondary | ICD-10-CM

## 2018-04-19 ENCOUNTER — Encounter (INDEPENDENT_AMBULATORY_CARE_PROVIDER_SITE_OTHER): Payer: Self-pay | Admitting: Orthopaedic Surgery

## 2018-04-19 ENCOUNTER — Ambulatory Visit (INDEPENDENT_AMBULATORY_CARE_PROVIDER_SITE_OTHER): Payer: Managed Care, Other (non HMO)

## 2018-04-19 ENCOUNTER — Ambulatory Visit (INDEPENDENT_AMBULATORY_CARE_PROVIDER_SITE_OTHER): Payer: Managed Care, Other (non HMO) | Admitting: Orthopaedic Surgery

## 2018-04-19 DIAGNOSIS — Z96641 Presence of right artificial hip joint: Secondary | ICD-10-CM | POA: Diagnosis not present

## 2018-04-19 NOTE — Progress Notes (Signed)
HPI: Mr. Sackmann returns now 1 year status post right total hip arthroplasty.  States overall the right hip is doing well.  He does note that he cannot quite tell when it is going the right" but otherwise doing great.  Said no calf pain shortness of breath chest pain.  Physical exam: General well-developed well-nourished male no acute distress mood and affect appropriate. Psych: Alert and oriented x3 Bilateral hips: Good range of motion of both hips without pain.  Calf supple nontender.  Ambulates without any assistive devices and a nonantalgic gait.  Radiographs:AP pelvis: Status post bilateral total hip arthroplasties with well-seated components.  No evidence of hardware failure.  No acute fractures or bony abnormalities bilateral hips.  Impression: Status post Left total hip arthroplasty 2017 Status post right total hip arthroplasty 05/03/2017  Plan: He is activities as tolerated.  Follow-up with [place] on as-needed basis.

## 2018-10-21 ENCOUNTER — Encounter: Payer: Self-pay | Admitting: Family Medicine

## 2018-10-21 ENCOUNTER — Other Ambulatory Visit: Payer: Self-pay | Admitting: Family Medicine

## 2018-10-25 ENCOUNTER — Other Ambulatory Visit: Payer: Self-pay | Admitting: Family Medicine

## 2018-10-25 DIAGNOSIS — K2100 Gastro-esophageal reflux disease with esophagitis, without bleeding: Secondary | ICD-10-CM

## 2018-10-25 DIAGNOSIS — R1310 Dysphagia, unspecified: Secondary | ICD-10-CM

## 2018-10-25 DIAGNOSIS — K222 Esophageal obstruction: Secondary | ICD-10-CM

## 2018-10-25 DIAGNOSIS — K220 Achalasia of cardia: Secondary | ICD-10-CM

## 2018-10-30 ENCOUNTER — Other Ambulatory Visit: Payer: Self-pay

## 2018-10-30 ENCOUNTER — Ambulatory Visit (INDEPENDENT_AMBULATORY_CARE_PROVIDER_SITE_OTHER): Payer: Managed Care, Other (non HMO) | Admitting: Medical

## 2018-10-30 ENCOUNTER — Ambulatory Visit (HOSPITAL_BASED_OUTPATIENT_CLINIC_OR_DEPARTMENT_OTHER)
Admission: RE | Admit: 2018-10-30 | Discharge: 2018-10-30 | Disposition: A | Discharge status: Home / Self Care | Payer: Managed Care, Other (non HMO) | Source: Ambulatory Visit | Attending: Medical | Admitting: Medical

## 2018-10-30 DIAGNOSIS — M25521 Pain in right elbow: Secondary | ICD-10-CM

## 2018-10-30 NOTE — Patient Instructions (Addendum)
You do have recent medial aspect elbow pain that coincided with swinging golf club.  Some direct mild pain in the area where you felt slight popping sensation.  Considering possible medial epicondyle tendon region injury as well as proximal forearm muscle strain.  We did x-ray today and I will review that and then notify you of the results.  I do think that you would benefit from low-dose Tylenol or low-dose ibuprofen for pain or inflammation.  Would recommend Tylenol first since ibuprofen can raise blood pressure.  Would recommend taking a week off from golf and you could also try some light compression to the area with Ace bandage.  If the area increases in pain or swells please notify us.  Follow-up in 1week virtual visit or sooner if needed.  If pain were to worsen and x-ray negative then might consider referral to sports medicine.

## 2018-10-30 NOTE — Progress Notes (Signed)
Subjective:    Patient ID: Gabriel [name], male    DOB: Sep 23, 1950, 68 y.o.   MRN: [id number]  HPI   Charging as office visit as eventually saw pt in person.  Location: Patient: home Provider: office.  Pt did not check blood pressure today.  Initially in office. Pt video on phone not working. He was coming in for xray anyway so asked radiolgist receptionist to let me know when he gets here.      History of Present Illness:  Pt states he was playing golf and hit ground before and ground did not budge. Ground was very hard. He heard a pop sensation and felt pain. Area swelled and bruised almost immediatley with some swelling. Pain more toward the elbow. He continued to play golf.  Radial pulse intact.  Forearm is not swollen compared to left side.     Observations/Objective:  General- no acute distress. Rt arm/elbow- proximal mid foream to medial aspect elbow faint bruise. Area is not warm. No indurated. Not swollen. He can pronate and supinate forearm well with no pain. On palpation very faint minimal pain small area just below medial epicondyle(this is area where he felt pop) you do have recent.Area of faint bruise 2.5 bm x 1 cm.  Assessment and Plan: You do have recent medial aspect elbow pain that coincided with swinging golf club.  Some direct mild pain in the area where you felt slight popping sensation.  Considering possible medial epicondyle tendon region injury as well as proximal forearm muscle strain.  We did x-ray today and I will review that and then notify you of the results.  I do think that you would benefit from low-dose Tylenol or low-dose ibuprofen for pain or inflammation.  Would recommend Tylenol first since ibuprofen can raise blood pressure.  Would recommend taking a week off from golf and you could also try some light compression to the area with Ace bandage.  If the area increases in pain or swells please notify us.  Follow-up in 1week virtual visit or  sooner if needed.  If pain were to worsen and x-ray negative then might consider referral to sports medicine.  25 minutes spent with patient.  50% of time spent counseling patient on plan going forward.  Note patient did initially have phone visit and then additional time taken downstairs to see pt ouside of radiology as I wanted to see the arm in person.    Follow Up Instructions:    I discussed the assessment and treatment plan with the patient. The patient was provided an opportunity to ask questions and all were answered. The patient agreed with the plan and demonstrated an understanding of the instructions.   The patient was advised to call back or seek an in-person evaluation if the symptoms worsen or if the condition fails to improve as anticipated.  I provided 25 minutes of non-face-to-face time during this encounter.   Mackie Pai, PA-C    Review of Systems  Constitutional: Negative for chills, fatigue and fever.  Respiratory: Negative for cough, chest tightness, shortness of breath and wheezing.   Cardiovascular: Negative for chest pain and palpitations.  Gastrointestinal: Negative for abdominal pain.  Musculoskeletal:       See HPI.  Neurological: Negative for facial asymmetry.  Hematological: Negative for adenopathy. Does not bruise/bleed easily.  Psychiatric/Behavioral: Negative for behavioral problems and confusion.    Past Medical History:  Diagnosis Date  . Achalasia   . Arthritis   . Colon cancer  screening 08/11/2014   Sees [facility], Dr Carlean Purl   . Colon polyp 08/11/2014   Se[facility], Dr Carlean Purl    . Eczema 01/09/2014  . Esophageal reflux 05/21/2012  . Hyperlipidemia   . Hypertension   . Insomnia 01/09/2014  . Left hip pain 08/11/2014  . OA (osteoarthritis) of hip    right  . Otitis, externa, infective 03/31/2015  . Overweight 11/14/2012  . Right hip pain 08/11/2014   Sees chiropractor, Dr Clovis Riley in [place]   . Skin lesion of left leg 10/07/2015  . Tinnitus 10/12/2016   . Ulcer    esophageal ulcer hx  . Wears contact lenses      Social History   Socioeconomic History  . Marital status: Married    Spouse name: Not on file  . Number of children: 2  . Years of education: Not on file  . Highest education level: Not on file  Occupational History  . Occupation: sales/distribution    Employer: [employer]  Social Needs  . Financial resource strain: Not on file  . Food insecurity    Worry: Not on file    Inability: Not on file  . Transportation needs    Medical: Not on file    Non-medical: Not on file  Tobacco Use  . Smoking status: Former Smoker    Types: Cigarettes    Quit date: 04/13/1991    Years since quitting: 27.5  . Smokeless tobacco: Never Used  . Tobacco comment: 1 ppd for 25 years quit 39'  Substance and Sexual Activity  . Alcohol use: Yes    Alcohol/week: 7.0 standard drinks    Types: 7 Glasses of wine per week    Comment: Wine nightly  . Drug use: No  . Sexual activity: Not on file  Lifestyle  . Physical activity    Days per week: Not on file    Minutes per session: Not on file  . Stress: Not on file  Relationships  . Social Herbalist on phone: Not on file    Gets together: Not on file    Attends religious service: Not on file    Active member of club or organization: Not on file    Attends meetings of clubs or organizations: Not on file    Relationship status: Not on file  . Intimate partner violence    Fear of current or ex partner: Not on file    Emotionally abused: Not on file    Physically abused: Not on file    Forced sexual activity: Not on file  Other Topics Concern  . Not on file  Social History Narrative   Married 2 daughters   Scientific laboratory technician for [employer]          Past Surgical History:  Procedure Laterality Date  . COLONOSCOPY    . ESOPHAGOGASTRODUODENOSCOPY    . HELLER MYOTOMY  1999   help with swallowing  . TONSILLECTOMY    . TONSILLECTOMY AND  ADENOIDECTOMY  1963  . 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   Procedure: LEFT TOTAL HIP ARTHROPLASTY ANTERIOR APPROACH;  Surgeon: Mcarthur Rossetti, MD;  Location: [facility];  Service: Orthopedics;  Laterality: Left;  . TOTAL HIP ARTHROPLASTY Right 05/03/2017   Procedure: RIGHT TOTAL HIP ARTHROPLASTY ANTERIOR APPROACH;  Surgeon: Mcarthur Rossetti, MD;  Location: [facility];  Service: Orthopedics;  Laterality: Right;  . UPPER GASTROINTESTINAL ENDOSCOPY      Family History  Problem Relation  Age of Onset  . Alcohol abuse Mother   . Ovarian cancer Mother   . Diabetes Maternal Grandmother   . Alcohol abuse Father   . Lung cancer Father   . Mental illness Sister   . Depression Sister   . Heart disease Brother        congenital heart  . Arthritis Sister   . Hypertension Sister   . Diverticulosis Sister   . Healthy Sister   . Healthy Sister   . Healthy Sister   . Colon cancer Neg Hx   . Esophageal cancer Neg Hx   . Rectal cancer Neg Hx   . Stomach cancer Neg Hx     No Known Allergies  Current Outpatient Medications on File Prior to Visit  Medication Sig Dispense Refill  . enalapril (VASOTEC) 10 MG tablet TAKE 1 TABLET BY MOUTH EVERY DAY 90 tablet 3  . hydrochlorothiazide (HYDRODIURIL) 12.5 MG tablet TAKE 1 TABLET (12.5 MG TOTAL) BY MOUTH DAILY. 90 tablet 3  . hyoscyamine (LEVSIN SL) 0.125 MG SL tablet Place 1 tablet (0.125 mg total) under the tongue every 4 (four) hours as needed. 30 tablet 3  . Multiple Vitamins-Minerals (ANTIOXIDANT VITAMINS) TABS Take 1 tablet by mouth daily. Life Guard    . naproxen sodium (ANAPROX) 220 MG tablet Take 220 mg by mouth 3 (three) times daily as needed (pain).     Marland Kitchen omeprazole (PRILOSEC) 40 MG capsule TAKE 1 CAPSULE BY MOUTH EVERY DAY 90 capsule 1  . pimecrolimus (ELIDEL) 1 % cream Apply 1 application topically daily. (Patient taking differently: Apply 1 application topically 3 (three) times daily as needed (rash). ) 60 g 5  . pyridostigmine  (MESTINON) 60 MG/5ML solution Take 2.5 mLs by mouth 3 (three) times daily.  12  . Red Yeast Rice Extract 600 MG TABS Take 1 tablet by mouth daily.     . TURMERIC PO Take 1 capsule by mouth daily.     No current facility-administered medications on file prior to visit.     There were no vitals taken for this visit.      Objective:   Physical Exam        Assessment & Plan:

## 2018-12-22 DIAGNOSIS — Z01812 Encounter for preprocedural laboratory examination: Secondary | ICD-10-CM | POA: Diagnosis not present

## 2018-12-22 DIAGNOSIS — Z1159 Encounter for screening for other viral diseases: Secondary | ICD-10-CM | POA: Diagnosis not present

## 2018-12-25 DIAGNOSIS — K222 Esophageal obstruction: Secondary | ICD-10-CM | POA: Diagnosis not present

## 2018-12-25 DIAGNOSIS — K219 Gastro-esophageal reflux disease without esophagitis: Secondary | ICD-10-CM | POA: Diagnosis not present

## 2018-12-25 DIAGNOSIS — K449 Diaphragmatic hernia without obstruction or gangrene: Secondary | ICD-10-CM | POA: Diagnosis not present

## 2018-12-25 DIAGNOSIS — E785 Hyperlipidemia, unspecified: Secondary | ICD-10-CM | POA: Diagnosis not present

## 2018-12-25 DIAGNOSIS — K220 Achalasia of cardia: Secondary | ICD-10-CM | POA: Diagnosis not present

## 2018-12-25 DIAGNOSIS — Z79899 Other long term (current) drug therapy: Secondary | ICD-10-CM | POA: Diagnosis not present

## 2018-12-25 DIAGNOSIS — I10 Essential (primary) hypertension: Secondary | ICD-10-CM | POA: Diagnosis not present

## 2018-12-25 DIAGNOSIS — R1310 Dysphagia, unspecified: Secondary | ICD-10-CM | POA: Diagnosis not present

## 2019-01-29 ENCOUNTER — Telehealth: Payer: Self-pay | Admitting: Medical

## 2019-01-29 DIAGNOSIS — R739 Hyperglycemia, unspecified: Secondary | ICD-10-CM

## 2019-01-29 DIAGNOSIS — E785 Hyperlipidemia, unspecified: Secondary | ICD-10-CM

## 2019-01-29 DIAGNOSIS — I10 Essential (primary) hypertension: Secondary | ICD-10-CM

## 2019-01-29 NOTE — Telephone Encounter (Signed)
Waiting on word from Gabriel [name] as to what insurance he has so I can know how to order labs.Which dx to associate future labs with.

## 2019-01-29 NOTE — Telephone Encounter (Signed)
Pt is scheduled for lab on 02/05/2019. Please enter orders.  CPE with Percell Miller scheduled for 02/09/2019.

## 2019-01-29 NOTE — Telephone Encounter (Signed)
Opened to review 

## 2019-01-30 ENCOUNTER — Telehealth: Payer: Self-pay | Admitting: Medical

## 2019-01-30 DIAGNOSIS — Z125 Encounter for screening for malignant neoplasm of prostate: Secondary | ICD-10-CM

## 2019-01-30 DIAGNOSIS — N400 Enlarged prostate without lower urinary tract symptoms: Secondary | ICD-10-CM

## 2019-01-30 DIAGNOSIS — I10 Essential (primary) hypertension: Secondary | ICD-10-CM

## 2019-01-30 DIAGNOSIS — R739 Hyperglycemia, unspecified: Secondary | ICD-10-CM

## 2019-01-30 DIAGNOSIS — E785 Hyperlipidemia, unspecified: Secondary | ICD-10-CM

## 2019-01-30 NOTE — Telephone Encounter (Signed)
Future labs placed. 

## 2019-02-05 ENCOUNTER — Other Ambulatory Visit (INDEPENDENT_AMBULATORY_CARE_PROVIDER_SITE_OTHER): Payer: Medicare Other

## 2019-02-05 ENCOUNTER — Other Ambulatory Visit: Payer: Self-pay

## 2019-02-05 DIAGNOSIS — R739 Hyperglycemia, unspecified: Secondary | ICD-10-CM | POA: Diagnosis not present

## 2019-02-05 DIAGNOSIS — E785 Hyperlipidemia, unspecified: Secondary | ICD-10-CM | POA: Diagnosis not present

## 2019-02-05 DIAGNOSIS — N400 Enlarged prostate without lower urinary tract symptoms: Secondary | ICD-10-CM

## 2019-02-05 DIAGNOSIS — I10 Essential (primary) hypertension: Secondary | ICD-10-CM | POA: Diagnosis not present

## 2019-02-05 DIAGNOSIS — Z125 Encounter for screening for malignant neoplasm of prostate: Secondary | ICD-10-CM | POA: Diagnosis not present

## 2019-02-05 LAB — CBC WITH DIFFERENTIAL/PLATELET
Basophils Absolute: 0 10*3/uL (ref 0.0–0.1)
Basophils Relative: 0.7 % (ref 0.0–3.0)
Eosinophils Absolute: 0.1 10*3/uL (ref 0.0–0.7)
Eosinophils Relative: 2.6 % (ref 0.0–5.0)
HCT: 44 % (ref 39.0–52.0)
Hemoglobin: 15.1 g/dL (ref 13.0–17.0)
Lymphocytes Relative: 30.4 % (ref 12.0–46.0)
Lymphs Abs: 1.6 10*3/uL (ref 0.7–4.0)
MCHC: 34.2 g/dL (ref 30.0–36.0)
MCV: 94.5 fl (ref 78.0–100.0)
Monocytes Absolute: 0.5 10*3/uL (ref 0.1–1.0)
Monocytes Relative: 9.8 % (ref 3.0–12.0)
Neutro Abs: 3 10*3/uL (ref 1.4–7.7)
Neutrophils Relative %: 56.5 % (ref 43.0–77.0)
Platelets: 290 10*3/uL (ref 150.0–400.0)
RBC: 4.66 Mil/uL (ref 4.22–5.81)
RDW: 12.4 % (ref 11.5–15.5)
WBC: 5.3 10*3/uL (ref 4.0–10.5)

## 2019-02-05 LAB — COMPREHENSIVE METABOLIC PANEL
ALT: 17 U/L (ref 0–53)
AST: 16 U/L (ref 0–37)
Albumin: 4.3 g/dL (ref 3.5–5.2)
Alkaline Phosphatase: 49 U/L (ref 39–117)
BUN: 10 mg/dL (ref 6–23)
CO2: 31 mEq/L (ref 19–32)
Calcium: 9.4 mg/dL (ref 8.4–10.5)
Chloride: 94 mEq/L — ABNORMAL LOW (ref 96–112)
Creatinine, Ser: 0.78 mg/dL (ref 0.40–1.50)
GFR: 99.25 mL/min (ref >60.00)
Glucose, Bld: 113 mg/dL — ABNORMAL HIGH (ref 70–99)
Potassium: 5.3 mEq/L — ABNORMAL HIGH (ref 3.5–5.1)
Sodium: 131 mEq/L — ABNORMAL LOW (ref 135–145)
Total Bilirubin: 0.5 mg/dL (ref 0.2–1.2)
Total Protein: 6.6 g/dL (ref 6.0–8.3)

## 2019-02-05 LAB — LIPID PANEL
Cholesterol: 180 mg/dL (ref 0–200)
HDL: 62.2 mg/dL (ref >39.00)
LDL Cholesterol: 104 mg/dL — ABNORMAL HIGH (ref 0–99)
NonHDL: 118.24
Total CHOL/HDL Ratio: 3
Triglycerides: 69 mg/dL (ref 0.0–149.0)
VLDL: 13.8 mg/dL (ref 0.0–40.0)

## 2019-02-05 LAB — PSA: PSA: 6.28 ng/mL — ABNORMAL HIGH (ref 0.10–4.00)

## 2019-02-05 LAB — HEMOGLOBIN A1C: Hgb A1c MFr Bld: 5.7 % (ref 4.6–6.5)

## 2019-02-07 ENCOUNTER — Other Ambulatory Visit: Payer: Self-pay | Admitting: Family Medicine

## 2019-02-08 ENCOUNTER — Other Ambulatory Visit: Payer: Self-pay

## 2019-02-09 ENCOUNTER — Encounter: Payer: Self-pay | Admitting: Medical

## 2019-02-09 ENCOUNTER — Other Ambulatory Visit: Payer: Self-pay

## 2019-02-09 ENCOUNTER — Ambulatory Visit (INDEPENDENT_AMBULATORY_CARE_PROVIDER_SITE_OTHER): Payer: Medicare Other | Admitting: Medical

## 2019-02-09 VITALS — BP 135/80 | HR 84 | Temp 96.6°F | Resp 16 | Ht 66.0 in | Wt 194.8 lb

## 2019-02-09 DIAGNOSIS — N400 Enlarged prostate without lower urinary tract symptoms: Secondary | ICD-10-CM

## 2019-02-09 DIAGNOSIS — I10 Essential (primary) hypertension: Secondary | ICD-10-CM | POA: Diagnosis not present

## 2019-02-09 DIAGNOSIS — R739 Hyperglycemia, unspecified: Secondary | ICD-10-CM | POA: Diagnosis not present

## 2019-02-09 DIAGNOSIS — Z23 Encounter for immunization: Secondary | ICD-10-CM | POA: Diagnosis not present

## 2019-02-09 NOTE — Progress Notes (Signed)
Subjective:    Patient ID: Gabriel [name], male    DOB: 1951/03/07, 68 y.o.   MRN: [id number]  HPI  Pt in for evaluation. He states he feels well.  Pt update me that he retired from work. He was working part time Education officer, community for 5 years.   Pt has hx of htn. And high cholesterol.  Pt has hx of elevated cholesterol.  Pt has recent mild elevated sugar.   Pt has elevated psa on review. Pt has seen urologist. Pt had negative biopsy and told large prostate.  He does admit some wait gain since last visit.Pt gained 9 lbs since end of July.   Pt got flu vaccine today.  Reviewed all of patient labs with him today.         Review of Systems  Constitutional: Negative for chills, fatigue and fever.  HENT: Negative for congestion and ear pain.   Respiratory: Negative for cough, chest tightness, shortness of breath and wheezing.   Cardiovascular: Negative for chest pain and palpitations.  Gastrointestinal: Negative for abdominal distention, abdominal pain, diarrhea and nausea.  Genitourinary: Negative for dysuria, frequency, hematuria, scrotal swelling and urgency.  Musculoskeletal: Negative for back pain, myalgias, neck pain and neck stiffness.  Skin: Negative for rash.  Neurological: Negative for dizziness, tremors, seizures and light-headedness.  Hematological: Negative for adenopathy. Does not bruise/bleed easily.  Psychiatric/Behavioral: Negative for behavioral problems, dysphoric mood and sleep disturbance. The patient is not nervous/anxious.     Past Medical History:  Diagnosis Date  . Achalasia   . Arthritis   . Colon cancer screening 08/11/2014   Se[facility], Dr Carlean Purl   . Colon polyp 08/11/2014   Sees [facility], Dr Carlean Purl    . Eczema 01/09/2014  . Esophageal reflux 05/21/2012  . Hyperlipidemia   . Hypertension   . Insomnia 01/09/2014  . Left hip pain 08/11/2014  . OA (osteoarthritis) of hip    right  . Otitis, externa, infective 03/31/2015  . Overweight 11/14/2012  . Right  hip pain 08/11/2014   Sees chiropractor, Dr Clovis Riley in [place]   . Skin lesion of left leg 10/07/2015  . Tinnitus 10/12/2016  . Ulcer    esophageal ulcer hx  . Wears contact lenses      Social History   Socioeconomic History  . Marital status: Married    Spouse name: Not on file  . Number of children: 2  . Years of education: Not on file  . Highest education level: Not on file  Occupational History  . Occupation: sales/distribution    Employer: [employer]  Social Needs  . Financial resource strain: Not on file  . Food insecurity    Worry: Not on file    Inability: Not on file  . Transportation needs    Medical: Not on file    Non-medical: Not on file  Tobacco Use  . Smoking status: Former Smoker    Types: Cigarettes    Quit date: 04/13/1991    Years since quitting: 27.8  . Smokeless tobacco: Never Used  . Tobacco comment: 1 ppd for 25 years quit 42'  Substance and Sexual Activity  . Alcohol use: Yes    Alcohol/week: 7.0 standard drinks    Types: 7 Glasses of wine per week    Comment: Wine nightly  . Drug use: No  . Sexual activity: Not on file  Lifestyle  . Physical activity    Days per week: Not on file    Minutes per  session: Not on file  . Stress: Not on file  Relationships  . Social Herbalist on phone: Not on file    Gets together: Not on file    Attends religious service: Not on file    Active member of club or organization: Not on file    Attends meetings of clubs or organizations: Not on file    Relationship status: Not on file  . Intimate partner violence    Fear of current or ex partner: Not on file    Emotionally abused: Not on file    Physically abused: Not on file    Forced sexual activity: Not on file  Other Topics Concern  . Not on file  Social History Narrative   Married 2 daughters   Scientific laboratory technician for [employer]          Past Surgical History:  Procedure Laterality Date  . COLONOSCOPY    .  ESOPHAGOGASTRODUODENOSCOPY    . HELLER MYOTOMY  1999   help with swallowing  . TONSILLECTOMY    . TONSILLECTOMY AND ADENOIDECTOMY  1963  . 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 TOTAL HIP ARTHROPLASTY Right 05/03/2017   Procedure: RIGHT TOTAL HIP ARTHROPLASTY ANTERIOR APPROACH;  Surgeon: Mcarthur Rossetti, MD;  Location: [facility];  Service: Orthopedics;  Laterality: Right;  . UPPER GASTROINTESTINAL ENDOSCOPY      Family History  Problem Relation Age of Onset  . Alcohol abuse Mother   . Ovarian cancer Mother   . Diabetes Maternal Grandmother   . Alcohol abuse Father   . Lung cancer Father   . Mental illness Sister   . Depression Sister   . Heart disease Brother        congenital heart  . Arthritis Sister   . Hypertension Sister   . Diverticulosis Sister   . Healthy Sister   . Healthy Sister   . Healthy Sister   . Colon cancer Neg Hx   . Esophageal cancer Neg Hx   . Rectal cancer Neg Hx   . Stomach cancer Neg Hx     No Known Allergies  Current Outpatient Medications on File Prior to Visit  Medication Sig Dispense Refill  . enalapril (VASOTEC) 10 MG tablet TAKE 1 TABLET BY MOUTH EVERY DAY 90 tablet 3  . hydrochlorothiazide (HYDRODIURIL) 12.5 MG tablet TAKE 1 TABLET BY MOUTH EVERY DAY 90 tablet 3  . Multiple Vitamins-Minerals (ANTIOXIDANT VITAMINS) TABS Take 1 tablet by mouth daily. Life Guard    . omeprazole (PRILOSEC) 40 MG capsule TAKE 1 CAPSULE BY MOUTH EVERY DAY 90 capsule 1  . pimecrolimus (ELIDEL) 1 % cream Apply 1 application topically daily. (Patient taking differently: Apply 1 application topically 3 (three) times daily as needed (rash). ) 60 g 5  . pyridostigmine (MESTINON) 60 MG/5ML solution Take 2.5 mLs by mouth 3 (three) times daily.  12  . Red Yeast Rice Extract 600 MG TABS Take 1 tablet by mouth  daily.      No current facility-administered medications on file prior to visit.     BP (!) 150/84   Pulse 84   Temp (!) 96.6 F (35.9 C) (Temporal)   Resp 16   Ht 5\' 6"  (1.676 m)   Wt 194 lb 12.8 oz (88.4 kg)   SpO2 100%  BMI 31.44 kg/m       Objective:   Physical Exam  General Mental Status- Alert. General Appearance- Not in acute distress.   Skin Scattered moles on back various.. Moisture- Normal Moisture.  Neck Carotid Arteries- Normal color. Moisture- Normal Moisture. No carotid bruits. No JVD.  Chest and Lung Exam Auscultation: Breath Sounds:-Normal.  Cardiovascular Auscultation:Rythm- Regular. Murmurs & Other Heart Sounds:Auscultation of the heart reveals- No Murmurs.  Abdomen Inspection:-Inspeection Normal. Palpation/Percussion:Note:No mass. Palpation and Percussion of the abdomen reveal- Non Tender, Non Distended + BS, no rebound or guarding.   Neurologic Cranial Nerve exam:- CN III-XII intact(No nystagmus), symmetric smile. Strength:- 5/5 equal and symmetric strength both upper and lower extremities.      Assessment & Plan:  Your bp is well controlled today. Better on recheck.  For high cholesterol continue red yeast rice and eat low cholesterol diet.  Your sugar was mild elevated but a1c not in diabetic range. If you cut back on carbs can prevent diabetes and will likeley loose weight.  Psa stable but follow up with your urologist.  psv 23 today. Also flu vaccine today.  Follow up 6 months or as needed  [employer], [employer]

## 2019-02-09 NOTE — Patient Instructions (Addendum)
Your bp is well controlled today. Better on recheck.  For high cholesterol continue red yeast rice and eat low cholesterol diet.  Your sugar was mild elevated but a1c not in diabetic range. If you cut back on carbs can prevent diabetes and will likeley loose weight.  Psa stable but follow up with your urologist.  psv 23 today. Also flu vaccine today.  For minimal borderline 0.2 point k increase advise avoid high k in diet. So minimal elevated did not think necessary to repeat today.   Follow up 6 months or as needed

## 2019-04-09 ENCOUNTER — Other Ambulatory Visit: Payer: Self-pay | Admitting: Family Medicine

## 2019-04-09 DIAGNOSIS — K222 Esophageal obstruction: Secondary | ICD-10-CM

## 2019-04-09 DIAGNOSIS — R1310 Dysphagia, unspecified: Secondary | ICD-10-CM

## 2019-04-09 DIAGNOSIS — K2100 Gastro-esophageal reflux disease with esophagitis, without bleeding: Secondary | ICD-10-CM

## 2019-04-09 DIAGNOSIS — K220 Achalasia of cardia: Secondary | ICD-10-CM

## 2019-05-08 ENCOUNTER — Other Ambulatory Visit: Payer: Self-pay | Admitting: Family Medicine

## 2019-05-08 ENCOUNTER — Encounter: Payer: Self-pay | Admitting: Family Medicine

## 2019-05-08 DIAGNOSIS — R040 Epistaxis: Secondary | ICD-10-CM

## 2019-05-08 MED ORDER — MUPIROCIN 2 % EX OINT: TOPICAL_OINTMENT | CUTANEOUS | 1 refills | Status: DC

## 2019-07-04 ENCOUNTER — Other Ambulatory Visit: Payer: Self-pay | Admitting: Family Medicine

## 2019-07-04 DIAGNOSIS — K220 Achalasia of cardia: Secondary | ICD-10-CM

## 2019-07-04 DIAGNOSIS — R1310 Dysphagia, unspecified: Secondary | ICD-10-CM

## 2019-07-04 DIAGNOSIS — K222 Esophageal obstruction: Secondary | ICD-10-CM

## 2019-07-04 DIAGNOSIS — K2100 Gastro-esophageal reflux disease with esophagitis, without bleeding: Secondary | ICD-10-CM

## 2019-08-21 DIAGNOSIS — Z20822 Contact with and (suspected) exposure to covid-19: Secondary | ICD-10-CM | POA: Diagnosis not present

## 2019-08-21 DIAGNOSIS — Z01812 Encounter for preprocedural laboratory examination: Secondary | ICD-10-CM | POA: Diagnosis not present

## 2019-08-24 DIAGNOSIS — K220 Achalasia of cardia: Secondary | ICD-10-CM | POA: Diagnosis not present

## 2019-08-24 DIAGNOSIS — E785 Hyperlipidemia, unspecified: Secondary | ICD-10-CM | POA: Diagnosis not present

## 2019-08-24 DIAGNOSIS — K222 Esophageal obstruction: Secondary | ICD-10-CM | POA: Diagnosis not present

## 2019-08-24 DIAGNOSIS — K219 Gastro-esophageal reflux disease without esophagitis: Secondary | ICD-10-CM | POA: Diagnosis not present

## 2019-08-24 DIAGNOSIS — I10 Essential (primary) hypertension: Secondary | ICD-10-CM | POA: Diagnosis not present

## 2019-08-24 DIAGNOSIS — K449 Diaphragmatic hernia without obstruction or gangrene: Secondary | ICD-10-CM | POA: Diagnosis not present

## 2019-08-24 DIAGNOSIS — Z9889 Other specified postprocedural states: Secondary | ICD-10-CM | POA: Diagnosis not present

## 2019-08-24 DIAGNOSIS — Z79899 Other long term (current) drug therapy: Secondary | ICD-10-CM | POA: Diagnosis not present

## 2019-08-24 DIAGNOSIS — M1990 Unspecified osteoarthritis, unspecified site: Secondary | ICD-10-CM | POA: Diagnosis not present

## 2019-09-26 ENCOUNTER — Other Ambulatory Visit: Payer: Self-pay | Admitting: *Deleted

## 2019-09-26 DIAGNOSIS — K220 Achalasia of cardia: Secondary | ICD-10-CM

## 2019-09-26 DIAGNOSIS — K222 Esophageal obstruction: Secondary | ICD-10-CM

## 2019-09-26 DIAGNOSIS — K2100 Gastro-esophageal reflux disease with esophagitis, without bleeding: Secondary | ICD-10-CM

## 2019-09-26 DIAGNOSIS — R1310 Dysphagia, unspecified: Secondary | ICD-10-CM

## 2019-09-26 MED ORDER — OMEPRAZOLE 40 MG PO CPDR: DELAYED_RELEASE_CAPSULE | ORAL | 0 refills | Status: DC

## 2019-11-15 DIAGNOSIS — U071 COVID-19: Secondary | ICD-10-CM | POA: Diagnosis not present

## 2020-01-02 DIAGNOSIS — U071 COVID-19: Secondary | ICD-10-CM | POA: Diagnosis not present

## 2020-01-05 ENCOUNTER — Encounter: Payer: Self-pay | Admitting: Family Medicine

## 2020-01-08 ENCOUNTER — Other Ambulatory Visit: Payer: Self-pay | Admitting: Family Medicine

## 2020-01-08 ENCOUNTER — Encounter: Payer: Medicare Other | Admitting: Medical

## 2020-01-11 DIAGNOSIS — U071 COVID-19: Secondary | ICD-10-CM | POA: Diagnosis not present

## 2020-01-14 ENCOUNTER — Other Ambulatory Visit: Payer: Self-pay

## 2020-01-14 ENCOUNTER — Telehealth (INDEPENDENT_AMBULATORY_CARE_PROVIDER_SITE_OTHER): Payer: Medicare Other | Admitting: Medical

## 2020-01-14 VITALS — BP 149/84 | HR 93 | Temp 97.7°F

## 2020-01-14 DIAGNOSIS — J0100 Acute maxillary sinusitis, unspecified: Secondary | ICD-10-CM

## 2020-01-14 MED ORDER — FLUTICASONE PROPIONATE 50 MCG/ACT NA SUSP: 2.0000 | Freq: Every day | NASAL | 1 refills | Status: DC

## 2020-01-14 MED ORDER — AZITHROMYCIN 250 MG PO TABS: ORAL_TABLET | ORAL | 0 refills | Status: DC

## 2020-01-14 MED ORDER — BENZONATATE 100 MG PO CAPS: 100.0000 mg | ORAL_CAPSULE | Freq: Three times a day (TID) | ORAL | 0 refills | Status: DC | PRN

## 2020-01-14 NOTE — Patient Instructions (Addendum)
You appear to have sinusitis and bronchitis type signs/symptoms. More sinus infection symptoms. Rest hydrate and tylenol for fever. I am prescribing cough medicine benzonatate and antibiotic azithromycin. For your nasal congestion rx flonase.  Want you to get rapid covid test otc as precaution event though already tested negative various times. Give me update on rapid test result.  If negative again and symptoms persist or worsen then need to see you in the office.  Follow up in 7-10 days or as needed

## 2020-01-14 NOTE — Progress Notes (Signed)
   Subjective:    Patient ID: Gabriel [name], male    DOB: 1951/01/28, 69 y.o.   MRN: [id number]  HPI Virtual Visit via Video Note  I connected with Gabriel [name] on 01/14/20 at  4:00 PM EDT by a video enabled telemedicine application and verified that I am speaking with the correct person using two identifiers.  Location: Patient: home.[place]. Provider: office     I discussed the limitations of evaluation and management by telemedicine and the availability of in person appointments. The patient expressed understanding and agreed to proceed.  History of Present Illness: Pt has been feeling sick since sept 21 st. Pt wife tested +  covid test. Pt states he has had cold type symptoms initiall. Pt tested negative both on sept 22nd and again on Sept 29, 2021. Both times got rapid test and pcr.   Pt has been vaccinated against covid. He got moderna. He got 2nd vaccine in April.   Pt has had nasal congestion, cough, some sweats off and on. Pt has some sinus pressure forehead and pressure.  Pt is not short of breath. Has productive cough in morning. Pt is not smoker. No hx of asthma.    Observations/Objective:  General-no acute distress, pleasant, oriented. Lungs- on inspection lungs appear unlabored. Neck- no tracheal deviation or jvd on inspection. Neuro- gross motor function appears intact.  Assessment and Plan:   You appear to have sinusitis and bronchitis type signs/symptoms. More sinus infection symptoms. Rest hydrate and tylenol for fever. I am prescribing cough medicine benzonatate and antibiotic azithromycin. For your nasal congestion rx flonase.  Want you to get rapid covid test otc as precaution event though already tested negative various times. Give me update on rapid test result.  If negative again and symptoms persist or worsen then need to see you in the office.  Follow up in 7-10 days or as needed  Mackie Pai, PA-C  Follow Up Instructions:    I  discussed the assessment and treatment plan with the patient. The patient was provided an opportunity to ask questions and all were answered. The patient agreed with the plan and demonstrated an understanding of the instructions.   The patient was advised to call back or seek an in-person evaluation if the symptoms worsen or if the condition fails to improve as anticipated.  I provided 30 minutes of non-face-to-face time during this encounter.   Mackie Pai, PA-C    Review of Systems  Constitutional: Negative for chills, fatigue and fever.  HENT: Positive for congestion, sinus pressure and sinus pain. Negative for ear pain.   Respiratory: Positive for cough. Negative for shortness of breath and wheezing.   Cardiovascular: Negative for chest pain and palpitations.  Gastrointestinal: Negative for abdominal pain.  Musculoskeletal: Negative for back pain.  Neurological: Negative for dizziness and headaches.  Hematological: Negative for adenopathy.  Psychiatric/Behavioral: Negative for behavioral problems and confusion.       Objective:   Physical Exam        Assessment & Plan:

## 2020-01-15 ENCOUNTER — Encounter: Payer: Self-pay | Admitting: Medical

## 2020-01-15 DIAGNOSIS — U071 COVID-19: Secondary | ICD-10-CM | POA: Diagnosis not present

## 2020-01-22 ENCOUNTER — Ambulatory Visit (INDEPENDENT_AMBULATORY_CARE_PROVIDER_SITE_OTHER): Payer: Medicare Other | Admitting: Medical

## 2020-01-22 ENCOUNTER — Other Ambulatory Visit: Payer: Self-pay

## 2020-01-22 ENCOUNTER — Ambulatory Visit (HOSPITAL_BASED_OUTPATIENT_CLINIC_OR_DEPARTMENT_OTHER)
Admission: RE | Admit: 2020-01-22 | Discharge: 2020-01-22 | Disposition: A | Discharge status: Home / Self Care | Payer: Medicare Other | Source: Ambulatory Visit | Attending: Medical | Admitting: Medical

## 2020-01-22 VITALS — BP 156/85 | HR 107 | Temp 98.6°F | Resp 18 | Ht 66.0 in | Wt 205.4 lb

## 2020-01-22 DIAGNOSIS — R059 Cough, unspecified: Secondary | ICD-10-CM | POA: Diagnosis not present

## 2020-01-22 DIAGNOSIS — J40 Bronchitis, not specified as acute or chronic: Secondary | ICD-10-CM

## 2020-01-22 DIAGNOSIS — Z23 Encounter for immunization: Secondary | ICD-10-CM | POA: Diagnosis not present

## 2020-01-22 DIAGNOSIS — J3489 Other specified disorders of nose and nasal sinuses: Secondary | ICD-10-CM

## 2020-01-22 MED ORDER — DOXYCYCLINE HYCLATE 100 MG PO TABS: 100.0000 mg | ORAL_TABLET | Freq: Two times a day (BID) | ORAL | 0 refills | Status: DC

## 2020-01-22 MED ORDER — METHYLPREDNISOLONE 4 MG PO TABS: ORAL_TABLET | ORAL | 0 refills | Status: DC

## 2020-01-22 NOTE — Progress Notes (Signed)
Subjective:    Patient ID: Gabriel [name], male    DOB: 04-08-1951, 69 y.o.   MRN: [id number]  HPI Pt in for follow up.  See last visit note. Pt states yesterday he felt better. Then this morning he woke and felt nasal and chest congestion.  Pt took benadryl last light.  Overall has been feeling sick since around sept 21, 2021. He had negative covid test various times.   See hpi on last visit virtual. "Pt has been feeling sick since sept 21 st. Pt wife tested +  covid test. Pt states he has had cold type symptoms initiall. Pt tested negative both on sept 22nd and again on Sept 29, 2021. Both times got rapid test and pcr.   Pt has been vaccinated against covid. He got moderna. He got 2nd vaccine in April.   Pt has had nasal congestion, cough, some sweats off and on. Pt has some sinus pressure forehead and pressure.  Pt is not short of breath. Has productive cough in morning. Pt is not smoker. No hx of asthma."  Pt states zpack seems to help his congested lung sensation.   Pt states cough is random but he can sleep. Benzonatate does help.     Review of Systems  Constitutional: Negative for chills, fatigue and fever.  HENT: Positive for sinus pressure.   Respiratory: Positive for wheezing. Negative for chest tightness and shortness of breath.        Rare  Cardiovascular: Negative for chest pain and palpitations.  Gastrointestinal: Negative for abdominal pain.  Musculoskeletal: Negative for back pain.  Psychiatric/Behavioral: Negative for behavioral problems.   Past Medical History:  Diagnosis Date  . Achalasia   . Arthritis   . Colon cancer screening 08/11/2014   [facility], Dr Carlean Purl   . Colon polyp 08/11/2014   [facility], Dr Carlean Purl    . Eczema 01/09/2014  . Esophageal reflux 05/21/2012  . Hyperlipidemia   . Hypertension   . Insomnia 01/09/2014  . Left hip pain 08/11/2014  . OA (osteoarthritis) of hip    right  . Otitis, externa, infective 03/31/2015  . Overweight  11/14/2012  . Right hip pain 08/11/2014   Sees chiropractor, Dr Clovis Riley in [place]   . Skin lesion of left leg 10/07/2015  . Tinnitus 10/12/2016  . Ulcer    esophageal ulcer hx  . Wears contact lenses      Social History   Socioeconomic History  . Marital status: Married    Spouse name: Not on file  . Number of children: 2  . Years of education: Not on file  . Highest education level: Not on file  Occupational History  . Occupation: sales/distribution    Employer: [employer]  Tobacco Use  . Smoking status: Former Smoker    Types: Cigarettes    Quit date: 04/13/1991    Years since quitting: 28.7  . Smokeless tobacco: Never Used  . Tobacco comment: 1 ppd for 25 years quit 62'  Vaping Use  . Vaping Use: Never used  Substance and Sexual Activity  . Alcohol use: Yes    Alcohol/week: 7.0 standard drinks    Types: 7 Glasses of wine per week    Comment: Wine nightly  . Drug use: No  . Sexual activity: Not on file  Other Topics Concern  . Not on file  Social History Narrative   Married 2 daughters   Scientific laboratory technician for [employer]  Social Determinants of Health   Financial Resource Strain:   . Difficulty of Paying Living Expenses: Not on file  Food Insecurity:   . Worried About Charity fundraiser in the Last Year: Not on file  . Ran Out of Food in the Last Year: Not on file  Transportation Needs:   . Lack of Transportation (Medical): Not on file  . Lack of Transportation (Non-Medical): Not on file  Physical Activity:   . Days of Exercise per Week: Not on file  . Minutes of Exercise per Session: Not on file  Stress:   . Feeling of Stress : Not on file  Social Connections:   . Frequency of Communication with Friends and Family: Not on file  . Frequency of Social Gatherings with Friends and Family: Not on file  . Attends Religious Services: Not on file  . Active Member of Clubs or Organizations: Not on file  . Attends Theatre manager Meetings: Not on file  . Marital Status: Not on file  Intimate Partner Violence:   . Fear of Current or Ex-Partner: Not on file  . Emotionally Abused: Not on file  . Physically Abused: Not on file  . Sexually Abused: Not on file    Past Surgical History:  Procedure Laterality Date  . COLONOSCOPY    . ESOPHAGOGASTRODUODENOSCOPY    . HELLER MYOTOMY  1999   help with swallowing  . TONSILLECTOMY    . TONSILLECTOMY AND ADENOIDECTOMY  1963  . 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   Procedure: LEFT TOTAL HIP ARTHROPLASTY ANTERIOR APPROACH;  Surgeon: Mcarthur Rossetti, MD;  Location: [facility];  Service: Orthopedics;  Laterality: Left;  . TOTAL HIP ARTHROPLASTY Right 05/03/2017   Procedure: RIGHT TOTAL HIP ARTHROPLASTY ANTERIOR APPROACH;  Surgeon: Mcarthur Rossetti, MD;  Location: [facility];  Service: Orthopedics;  Laterality: Right;  . UPPER GASTROINTESTINAL ENDOSCOPY      Family History  Problem Relation Age of Onset  . Alcohol abuse Mother   . Ovarian cancer Mother   . Diabetes Maternal Grandmother   . Alcohol abuse Father   . Lung cancer Father   . Mental illness Sister   . Depression Sister   . Heart disease Brother        congenital heart  . Arthritis Sister   . Hypertension Sister   . Diverticulosis Sister   . Healthy Sister   . Healthy Sister   . Healthy Sister   . Colon cancer Neg Hx   . Esophageal cancer Neg Hx   . Rectal cancer Neg Hx   . Stomach cancer Neg Hx     No Known Allergies  Current Outpatient Medications on File Prior to Visit  Medication Sig Dispense Refill  . benzonatate (TESSALON) 100 MG capsule Take 1 capsule (100 mg total) by mouth 3 (three) times daily as needed for cough. 30 capsule 0  . enalapril (VASOTEC) 10 MG tablet Take 1 tablet (10 mg total) by mouth daily. 90 tablet 0  . fluticasone (FLONASE) 50 MCG/ACT nasal spray Place 2 sprays into both nostrils daily. 16 g 1  . hydrochlorothiazide (HYDRODIURIL) 12.5 MG tablet TAKE 1  TABLET BY MOUTH EVERY DAY 90 tablet 3  . Multiple Vitamins-Minerals (ANTIOXIDANT VITAMINS) TABS Take 1 tablet by mouth daily. Life Guard    . mupirocin ointment (BACTROBAN) 2 % Apply to b/l nares qhs x 7 days then as needed 22 g 1  . omeprazole (PRILOSEC) 40 MG capsule TAKE 1 CAPSULE BY  MOUTH EVERY DAY 90 capsule 0  . pimecrolimus (ELIDEL) 1 % cream Apply 1 application topically daily. (Patient taking differently: Apply 1 application topically 3 (three) times daily as needed (rash). ) 60 g 5  . pyridostigmine (MESTINON) 60 MG/5ML solution Take 2.5 mLs by mouth 3 (three) times daily.  12  . Red Yeast Rice Extract 600 MG TABS Take 1 tablet by mouth daily.     Marland Kitchen azithromycin (ZITHROMAX) 250 MG tablet Take 2 tablets by mouth on day 1, followed by 1 tablet by mouth daily for 4 days. 6 tablet 0   No current facility-administered medications on file prior to visit.    BP (!) 156/85   Pulse (!) 107   Temp 98.6 F (37 C) (Oral)   Resp 18   Ht 5\' 6"  (1.676 m)   Wt 205 lb 6.4 oz (93.2 kg)   SpO2 96%   BMI 33.15 kg/m       Objective:   Physical Exam  General  Mental Status - Alert. General Appearance - Well groomed. Not in acute distress.  Skin Rashes- No Rashes.  HEENT Head- Normal. Ear Auditory Canal - Left- Normal. Right - Normal.Tympanic Membrane- Left- Normal. Right- Normal. Eye Sclera/Conjunctiva- Left- Normal. Right- Normal. Nose & Sinuses Nasal Mucosa- Left-  Boggy and Congested. Right-  Boggy and  Congested.Bilateral maxillary and frontal sinus pressure.   Neck Neck- Supple. No Masses.   Chest and Lung Exam Auscultation: Breath Sounds:-Clear even and unlabored.  Cardiovascular Auscultation:Rythm- Regular, rate and rhythm. Murmurs & Other Heart Sounds:Ausculatation of the heart reveal- No Murmurs.  Lymphatic Head & Neck General Head & Neck Lymphatics: Bilateral: Description- No Localized lymphadenopathy.      Assessment & Plan:  You have had recent sinus  pressure/possible infection , cough and bronchitis symptoms for 3 weeks despite treatment.  All Covid test negative so far.  We will prescribe doxycycline antibiotic to cover sinus infection and bronchitis.  Will get chest x-ray today.  Continue benzonatate for cough.  This Friday I want you to start Medrol 6-day taper dose.  You did get flu vaccine today so I want you to wait until Friday per our discussion before starting Medrol.  If signs symptoms linger despite above treatment then would consider Rocephin injection, steroid injection and possible referral to specialist.  Follow-up 10 to 14 days or as needed.

## 2020-01-22 NOTE — Patient Instructions (Signed)
You have had recent sinus pressure/possible infection , cough and bronchitis symptoms for 3 weeks despite treatment.  All Covid test negative so far.  We will prescribe doxycycline antibiotic to cover sinus infection and bronchitis.  Will get chest x-ray today.  Continue benzonatate for cough.  This Friday I want you to start Medrol 6-day taper dose.  You did get flu vaccine today so I want you to wait until Friday per our discussion before starting Medrol.  If signs symptoms linger despite above treatment then would consider Rocephin injection, steroid injection and possible referral to specialist.  Follow-up 10 to 14 days or as needed.

## 2020-01-24 ENCOUNTER — Other Ambulatory Visit: Payer: Self-pay | Admitting: Medical

## 2020-01-29 ENCOUNTER — Other Ambulatory Visit: Payer: Self-pay | Admitting: Family Medicine

## 2020-01-29 DIAGNOSIS — K2100 Gastro-esophageal reflux disease with esophagitis, without bleeding: Secondary | ICD-10-CM

## 2020-01-29 DIAGNOSIS — K220 Achalasia of cardia: Secondary | ICD-10-CM

## 2020-01-29 DIAGNOSIS — R1310 Dysphagia, unspecified: Secondary | ICD-10-CM

## 2020-01-29 DIAGNOSIS — K222 Esophageal obstruction: Secondary | ICD-10-CM

## 2020-02-05 ENCOUNTER — Other Ambulatory Visit: Payer: Self-pay | Admitting: Medical

## 2020-02-20 ENCOUNTER — Other Ambulatory Visit: Payer: Self-pay | Admitting: Medical

## 2020-03-04 ENCOUNTER — Encounter: Payer: Self-pay | Admitting: Medical

## 2020-03-04 ENCOUNTER — Ambulatory Visit (INDEPENDENT_AMBULATORY_CARE_PROVIDER_SITE_OTHER): Payer: Medicare Other | Admitting: Medical

## 2020-03-04 ENCOUNTER — Other Ambulatory Visit: Payer: Self-pay

## 2020-03-04 VITALS — BP 150/80 | HR 91 | Resp 18 | Ht 66.0 in | Wt 204.0 lb

## 2020-03-04 DIAGNOSIS — I10 Essential (primary) hypertension: Secondary | ICD-10-CM | POA: Diagnosis not present

## 2020-03-04 DIAGNOSIS — R972 Elevated prostate specific antigen [PSA]: Secondary | ICD-10-CM | POA: Diagnosis not present

## 2020-03-04 DIAGNOSIS — R739 Hyperglycemia, unspecified: Secondary | ICD-10-CM

## 2020-03-04 DIAGNOSIS — K219 Gastro-esophageal reflux disease without esophagitis: Secondary | ICD-10-CM

## 2020-03-04 DIAGNOSIS — E785 Hyperlipidemia, unspecified: Secondary | ICD-10-CM | POA: Diagnosis not present

## 2020-03-04 DIAGNOSIS — Z1283 Encounter for screening for malignant neoplasm of skin: Secondary | ICD-10-CM | POA: Diagnosis not present

## 2020-03-04 LAB — LIPID PANEL
Cholesterol: 187 mg/dL (ref 0–200)
HDL: 59.3 mg/dL (ref >39.00)
LDL Cholesterol: 113 mg/dL — ABNORMAL HIGH (ref 0–99)
NonHDL: 127.29
Total CHOL/HDL Ratio: 3
Triglycerides: 71 mg/dL (ref 0.0–149.0)
VLDL: 14.2 mg/dL (ref 0.0–40.0)

## 2020-03-04 LAB — PSA: PSA: 3.68 ng/mL (ref 0.10–4.00)

## 2020-03-04 LAB — COMPREHENSIVE METABOLIC PANEL
ALT: 39 U/L (ref 0–53)
AST: 23 U/L (ref 0–37)
Albumin: 4.4 g/dL (ref 3.5–5.2)
Alkaline Phosphatase: 52 U/L (ref 39–117)
BUN: 11 mg/dL (ref 6–23)
CO2: 30 mEq/L (ref 19–32)
Calcium: 9.3 mg/dL (ref 8.4–10.5)
Chloride: 96 mEq/L (ref 96–112)
Creatinine, Ser: 0.85 mg/dL (ref 0.40–1.50)
GFR: 89.49 mL/min (ref >60.00)
Glucose, Bld: 110 mg/dL — ABNORMAL HIGH (ref 70–99)
Potassium: 4.4 mEq/L (ref 3.5–5.1)
Sodium: 132 mEq/L — ABNORMAL LOW (ref 135–145)
Total Bilirubin: 0.7 mg/dL (ref 0.2–1.2)
Total Protein: 6.7 g/dL (ref 6.0–8.3)

## 2020-03-04 LAB — HEMOGLOBIN A1C: Hgb A1c MFr Bld: 6 % (ref 4.6–6.5)

## 2020-03-04 MED ORDER — ENALAPRIL MALEATE 20 MG PO TABS: 20.0000 mg | ORAL_TABLET | Freq: Every day | ORAL | 3 refills | Status: DC

## 2020-03-04 NOTE — Progress Notes (Signed)
Subjective:    Patient ID: Gabriel [name], male    DOB: [birth year], 69 y.o.   MRN: [id number]  HPI Pt in for regular check up on med problems.  Pt is fasting. Has htn, high cholesterol and elevated sugar.  Pt has htn. Pt is on elapril and hctz. No cardiac or neurologic signs or symptoms.  Pt cholesterol mild high ldl in 2020.   Pt last psa was elevated. Biopsy was negative. He was told to follow up in a year. He has not been called.  A1c was not elevated on last labs.      Review of Systems  Constitutional: Negative for chills, fatigue and fever.  Respiratory: Negative for cough, chest tightness, shortness of breath and wheezing.   Cardiovascular: Negative for chest pain and palpitations.  Gastrointestinal: Negative for abdominal pain.  Musculoskeletal: Negative for back pain.  Skin: Negative for rash.  Neurological: Negative for dizziness, syncope, weakness, numbness and headaches.  Hematological: Negative for adenopathy. Does not bruise/bleed easily.  Psychiatric/Behavioral: Negative for behavioral problems and confusion.    Past Medical History:  Diagnosis Date  . Achalasia   . Arthritis   . Colon cancer screening 08/11/2014   Sees [facility], Dr Carlean Purl   . Colon polyp 08/11/2014   Sees [facility], Dr Carlean Purl    . Eczema 01/09/2014  . Esophageal reflux 05/21/2012  . Hyperlipidemia   . Hypertension   . Insomnia 01/09/2014  . Left hip pain 08/11/2014  . OA (osteoarthritis) of hip    right  . Otitis, externa, infective 03/31/2015  . Overweight 11/14/2012  . Right hip pain 08/11/2014   Sees chiropractor, Dr Clovis Riley in [place]   . Skin lesion of left leg 10/07/2015  . Tinnitus 10/12/2016  . Ulcer    esophageal ulcer hx  . Wears contact lenses      Social History   Socioeconomic History  . Marital status: Married    Spouse name: Not on file  . Number of children: 2  . Years of education: Not on file  . Highest education level: Not on file  Occupational History  . Occupation:  sales/distribution    Employer: [employer]  Tobacco Use  . Smoking status: Former Smoker    Types: Cigarettes    Quit date: 04/13/1991    Years since quitting: 28.9  . Smokeless tobacco: Never Used  . Tobacco comment: 1 ppd for 25 years quit 36'  Vaping Use  . Vaping Use: Never used  Substance and Sexual Activity  . Alcohol use: Yes    Alcohol/week: 7.0 standard drinks    Types: 7 Glasses of wine per week    Comment: Wine nightly  . Drug use: No  . Sexual activity: Not on file  Other Topics Concern  . Not on file  Social History Narrative   Married 2 daughters   Scientific laboratory technician for [employer]         Social Determinants of [employer] Resource Strain:   . Difficulty of Paying Living Expenses: Not on file  Food Insecurity:   . Worried About Charity fundraiser in the Last Year: Not on file  . Ran Out of Food in the Last Year: Not on file  Transportation Needs:   . Lack of Transportation (Medical): Not on file  . Lack of Transportation (Non-Medical): Not on file  Physical Activity:   . Days of Exercise per Week: Not on file  . Minutes of Exercise  per Session: Not on file  Stress:   . Feeling of Stress : Not on file  Social Connections:   . Frequency of Communication with Friends and Family: Not on file  . Frequency of Social Gatherings with Friends and Family: Not on file  . Attends Religious Services: Not on file  . Active Member of Clubs or Organizations: Not on file  . Attends Archivist Meetings: Not on file  . Marital Status: Not on file  Intimate Partner Violence:   . Fear of Current or Ex-Partner: Not on file  . Emotionally Abused: Not on file  . Physically Abused: Not on file  . Sexually Abused: Not on file    Past Surgical History:  Procedure Laterality Date  . COLONOSCOPY    . ESOPHAGOGASTRODUODENOSCOPY    . HELLER MYOTOMY  1999   help with swallowing  . TONSILLECTOMY    . TONSILLECTOMY AND  ADENOIDECTOMY  1963  . 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   Procedure: LEFT TOTAL HIP ARTHROPLASTY ANTERIOR APPROACH;  Surgeon: Mcarthur Rossetti, MD;  Location: [facility];  Service: Orthopedics;  Laterality: Left;  . TOTAL HIP ARTHROPLASTY Right 05/03/2017   Procedure: RIGHT TOTAL HIP ARTHROPLASTY ANTERIOR APPROACH;  Surgeon: Mcarthur Rossetti, MD;  Location: [facility];  Service: Orthopedics;  Laterality: Right;  . UPPER GASTROINTESTINAL ENDOSCOPY      Family History  Problem Relation Age of Onset  . Alcohol abuse Mother   . Ovarian cancer Mother   . Diabetes Maternal Grandmother   . Alcohol abuse Father   . Lung cancer Father   . Mental illness Sister   . Depression Sister   . Heart disease Brother        congenital heart  . Arthritis Sister   . Hypertension Sister   . Diverticulosis Sister   . Healthy Sister   . Healthy Sister   . Healthy Sister   . Colon cancer Neg Hx   . Esophageal cancer Neg Hx   . Rectal cancer Neg Hx   . Stomach cancer Neg Hx     No Known Allergies  Current Outpatient Medications on File Prior to Visit  Medication Sig Dispense Refill  . enalapril (VASOTEC) 10 MG tablet Take 1 tablet (10 mg total) by mouth daily. 90 tablet 0  . hydrochlorothiazide (HYDRODIURIL) 12.5 MG tablet TAKE 1 TABLET BY MOUTH EVERY DAY 90 tablet 3  . Multiple Vitamins-Minerals (ANTIOXIDANT VITAMINS) TABS Take 1 tablet by mouth daily. Life Guard    . mupirocin ointment (BACTROBAN) 2 % Apply to b/l nares qhs x 7 days then as needed 22 g 1  . omeprazole (PRILOSEC) 40 MG capsule TAKE 1 CAPSULE BY MOUTH EVERY DAY 90 capsule 0  . pimecrolimus (ELIDEL) 1 % cream Apply 1 application topically daily. (Patient taking differently: Apply 1 application topically 3 (three) times daily as needed (rash). ) 60 g 5  . pyridostigmine (MESTINON) 60 MG/5ML solution Take 2.5 mLs by mouth 3 (three) times daily.  12  . Red Yeast Rice Extract 600 MG TABS Take 1 tablet by mouth daily.     Marland Kitchen  azithromycin (ZITHROMAX) 250 MG tablet Take 2 tablets by mouth on day 1, followed by 1 tablet by mouth daily for 4 days. 6 tablet 0  . benzonatate (TESSALON) 100 MG capsule Take 1 capsule (100 mg total) by mouth 3 (three) times daily as needed for cough. 30 capsule 0  . doxycycline (VIBRA-TABS) 100 MG tablet Take 1 tablet (100  mg total) by mouth 2 (two) times daily. Can give caps or generic. 14 tablet 0  . fluticasone (FLONASE) 50 MCG/ACT nasal spray SPRAY 2 SPRAYS INTO EACH NOSTRIL EVERY DAY 48 mL 1  . methylPREDNISolone (MEDROL) 4 MG tablet Standard 6 day taper dose. 21 tablet 0   No current facility-administered medications on file prior to visit.    Pulse 91   Resp 18   Ht 5\' 6"  (1.676 m)   Wt 204 lb (92.5 kg)   SpO2 98%   BMI 32.93 kg/m       Objective:   Physical Exam  General Mental Status- Alert. General Appearance- Not in acute distress.   Skin scatterd numerous moles all over. Some darker than other. Some greater than 8 mm.   Neck Carotid Arteries- Normal color. Moisture- Normal Moisture. No carotid bruits. No JVD.  Chest and Lung Exam Auscultation: Breath Sounds:-Normal.  Cardiovascular Auscultation:Rythm- Regular. Murmurs & Other Heart Sounds:Auscultation of the heart reveals- No Murmurs.  Abdomen Inspection:-Inspeection Normal. Palpation/Percussion:Note:No mass. Palpation and Percussion of the abdomen reveal- Non Tender, Non Distended + BS, no rebound or guarding.   Neurologic Cranial Nerve exam:- CN III-XII intact(No nystagmus), symmetric smile. Strength:- 5/5 equal and symmetric strength both upper and lower extremities.      Assessment & Plan:  Your bp is high today. Will give higher dose enalapril to 20 mg every day. Continue hctz.  For elevated sugar get cmp and a1c.  For high cholesterol recheck cmp.  Gerd symptoms contolled on omeprazole.  Will repeat psa. Follow up with urologist as they advised.  Referral to dermatologist placed for  skin cancer screening.  Follow up date to be determined after lab review.  Mackie Pai, PA-C

## 2020-03-04 NOTE — Patient Instructions (Addendum)
Your bp is high today. Will give higher dose enalapril to 20 mg every day. Continue hctz.  For elevated sugar get cmp and a1c.  For high cholesterol recheck cmp.  Gerd symptoms contolled on omeprazole.  Will repeat psa. Follow up with urologist as they advised.  Referral to dermatologist placed for skin cancer screening.  Follow up date to be determined after lab review.

## 2020-03-15 ENCOUNTER — Other Ambulatory Visit: Payer: Self-pay | Admitting: Family Medicine

## 2020-03-20 DIAGNOSIS — D1801 Hemangioma of skin and subcutaneous tissue: Secondary | ICD-10-CM | POA: Diagnosis not present

## 2020-03-20 DIAGNOSIS — D692 Other nonthrombocytopenic purpura: Secondary | ICD-10-CM | POA: Diagnosis not present

## 2020-03-20 DIAGNOSIS — D2272 Melanocytic nevi of left lower limb, including hip: Secondary | ICD-10-CM | POA: Diagnosis not present

## 2020-03-20 DIAGNOSIS — C44519 Basal cell carcinoma of skin of other part of trunk: Secondary | ICD-10-CM | POA: Diagnosis not present

## 2020-03-20 DIAGNOSIS — D485 Neoplasm of uncertain behavior of skin: Secondary | ICD-10-CM | POA: Diagnosis not present

## 2020-03-20 DIAGNOSIS — L814 Other melanin hyperpigmentation: Secondary | ICD-10-CM | POA: Diagnosis not present

## 2020-03-20 DIAGNOSIS — D2262 Melanocytic nevi of left upper limb, including shoulder: Secondary | ICD-10-CM | POA: Diagnosis not present

## 2020-03-20 DIAGNOSIS — D2261 Melanocytic nevi of right upper limb, including shoulder: Secondary | ICD-10-CM | POA: Diagnosis not present

## 2020-03-20 DIAGNOSIS — L570 Actinic keratosis: Secondary | ICD-10-CM | POA: Diagnosis not present

## 2020-04-02 ENCOUNTER — Other Ambulatory Visit: Payer: Self-pay | Admitting: Family Medicine

## 2020-04-27 ENCOUNTER — Other Ambulatory Visit: Payer: Self-pay | Admitting: Family Medicine

## 2020-04-27 DIAGNOSIS — R1310 Dysphagia, unspecified: Secondary | ICD-10-CM

## 2020-04-27 DIAGNOSIS — K2100 Gastro-esophageal reflux disease with esophagitis, without bleeding: Secondary | ICD-10-CM

## 2020-04-27 DIAGNOSIS — K222 Esophageal obstruction: Secondary | ICD-10-CM

## 2020-04-27 DIAGNOSIS — K220 Achalasia of cardia: Secondary | ICD-10-CM

## 2020-05-06 DIAGNOSIS — I10 Essential (primary) hypertension: Secondary | ICD-10-CM | POA: Diagnosis not present

## 2020-05-06 DIAGNOSIS — K449 Diaphragmatic hernia without obstruction or gangrene: Secondary | ICD-10-CM | POA: Diagnosis not present

## 2020-05-06 DIAGNOSIS — K222 Esophageal obstruction: Secondary | ICD-10-CM | POA: Diagnosis not present

## 2020-05-06 DIAGNOSIS — Z9889 Other specified postprocedural states: Secondary | ICD-10-CM | POA: Diagnosis not present

## 2020-05-06 DIAGNOSIS — R1310 Dysphagia, unspecified: Secondary | ICD-10-CM | POA: Diagnosis not present

## 2020-05-06 DIAGNOSIS — K220 Achalasia of cardia: Secondary | ICD-10-CM | POA: Diagnosis not present

## 2020-05-06 DIAGNOSIS — M1990 Unspecified osteoarthritis, unspecified site: Secondary | ICD-10-CM | POA: Diagnosis not present

## 2020-05-06 DIAGNOSIS — Z79899 Other long term (current) drug therapy: Secondary | ICD-10-CM | POA: Diagnosis not present

## 2020-05-06 DIAGNOSIS — K219 Gastro-esophageal reflux disease without esophagitis: Secondary | ICD-10-CM | POA: Diagnosis not present

## 2020-05-06 DIAGNOSIS — E785 Hyperlipidemia, unspecified: Secondary | ICD-10-CM | POA: Diagnosis not present

## 2020-07-17 ENCOUNTER — Encounter: Payer: Self-pay | Admitting: Family Medicine

## 2020-07-26 ENCOUNTER — Other Ambulatory Visit: Payer: Self-pay | Admitting: Family Medicine

## 2020-07-26 DIAGNOSIS — K220 Achalasia of cardia: Secondary | ICD-10-CM

## 2020-07-26 DIAGNOSIS — K2100 Gastro-esophageal reflux disease with esophagitis, without bleeding: Secondary | ICD-10-CM

## 2020-07-26 DIAGNOSIS — K222 Esophageal obstruction: Secondary | ICD-10-CM

## 2020-07-26 DIAGNOSIS — R1310 Dysphagia, unspecified: Secondary | ICD-10-CM

## 2020-08-06 ENCOUNTER — Telehealth: Payer: Self-pay

## 2020-08-06 NOTE — Telephone Encounter (Signed)
Sent a my chart message.

## 2020-08-06 NOTE — Telephone Encounter (Signed)
Sent pt on my chart

## 2020-08-07 ENCOUNTER — Encounter: Payer: Self-pay | Admitting: Family Medicine

## 2020-08-07 NOTE — Telephone Encounter (Signed)
Looks like patient has taken sildenafi (revatio) in 2015 but has not taken viagra. Does he need an appointment to see you or someone?

## 2020-08-13 ENCOUNTER — Other Ambulatory Visit: Payer: Self-pay | Admitting: Family Medicine

## 2020-08-13 DIAGNOSIS — K222 Esophageal obstruction: Secondary | ICD-10-CM

## 2020-08-13 DIAGNOSIS — K2100 Gastro-esophageal reflux disease with esophagitis, without bleeding: Secondary | ICD-10-CM

## 2020-08-13 DIAGNOSIS — K220 Achalasia of cardia: Secondary | ICD-10-CM

## 2020-08-13 DIAGNOSIS — R1310 Dysphagia, unspecified: Secondary | ICD-10-CM

## 2020-08-13 NOTE — Telephone Encounter (Signed)
Patient called today to schedule appt.. patient refuse to see anyone else. Patient was advise it will be months before Dr. Charlett Blake next available appt. Patient refuse to wait till august (next available)

## 2020-08-14 MED ORDER — OMEPRAZOLE 40 MG PO CPDR: 40.0000 mg | DELAYED_RELEASE_CAPSULE | Freq: Every day | ORAL | 0 refills | Status: DC

## 2020-08-14 NOTE — Telephone Encounter (Signed)
Patient scheduled.

## 2020-08-28 ENCOUNTER — Other Ambulatory Visit: Payer: Self-pay

## 2020-08-28 ENCOUNTER — Encounter: Payer: Self-pay | Admitting: Family Medicine

## 2020-08-28 ENCOUNTER — Ambulatory Visit (INDEPENDENT_AMBULATORY_CARE_PROVIDER_SITE_OTHER): Payer: Medicare Other | Admitting: Family Medicine

## 2020-08-28 VITALS — BP 136/88 | HR 101 | Temp 98.6°F | Resp 18 | Wt 205.6 lb

## 2020-08-28 DIAGNOSIS — N529 Male erectile dysfunction, unspecified: Secondary | ICD-10-CM

## 2020-08-28 DIAGNOSIS — I10 Essential (primary) hypertension: Secondary | ICD-10-CM | POA: Diagnosis not present

## 2020-08-28 DIAGNOSIS — R739 Hyperglycemia, unspecified: Secondary | ICD-10-CM

## 2020-08-28 DIAGNOSIS — G4700 Insomnia, unspecified: Secondary | ICD-10-CM

## 2020-08-28 DIAGNOSIS — K222 Esophageal obstruction: Secondary | ICD-10-CM

## 2020-08-28 DIAGNOSIS — E785 Hyperlipidemia, unspecified: Secondary | ICD-10-CM | POA: Diagnosis not present

## 2020-08-28 IMAGING — CR RIGHT ELBOW - COMPLETE 3+ VIEW
4 series · 4 of 4 positions shown · non-contrast
Comparison: None.

CLINICAL DATA: Injured elbow playing golf 2 days ago.

EXAM:
RIGHT ELBOW - COMPLETE 3+ VIEW

[x elbow joint ap right]
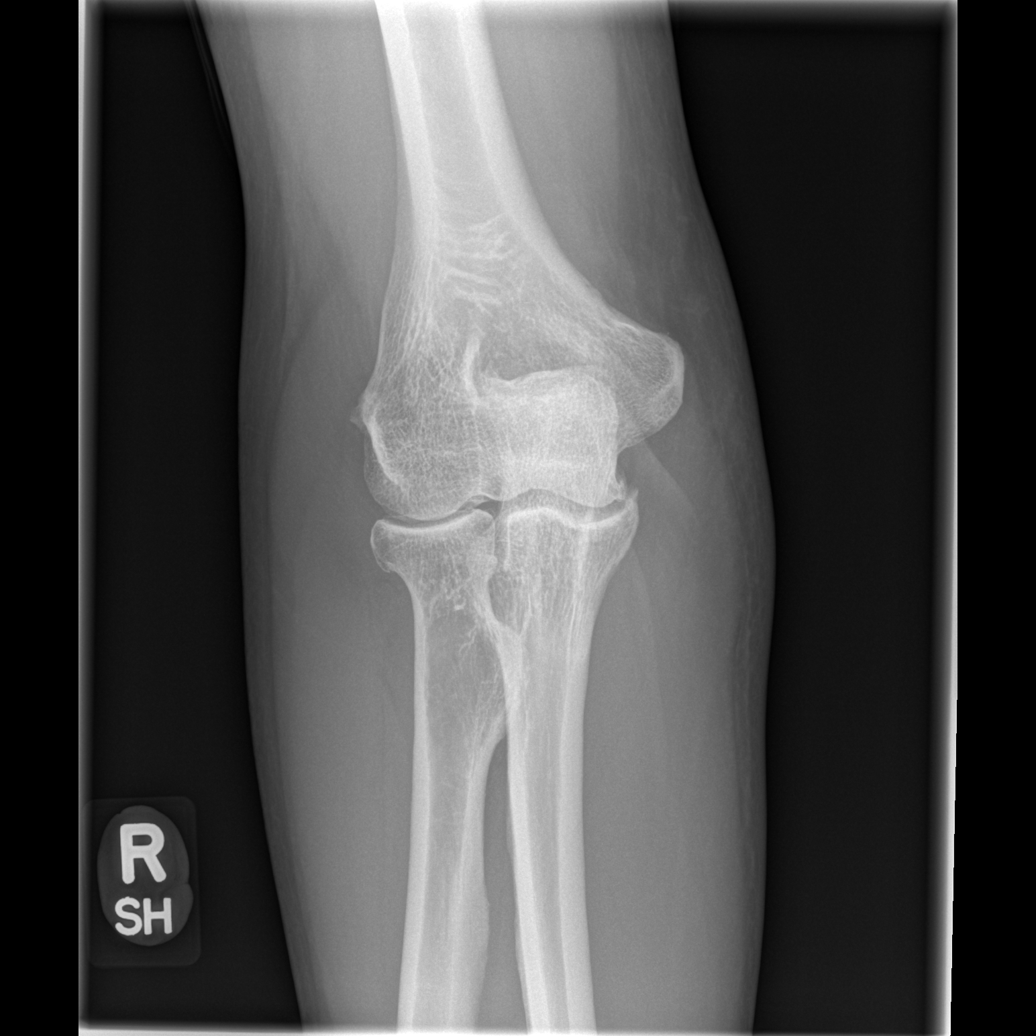

[x elbow joint obl. right (1 of 2)]
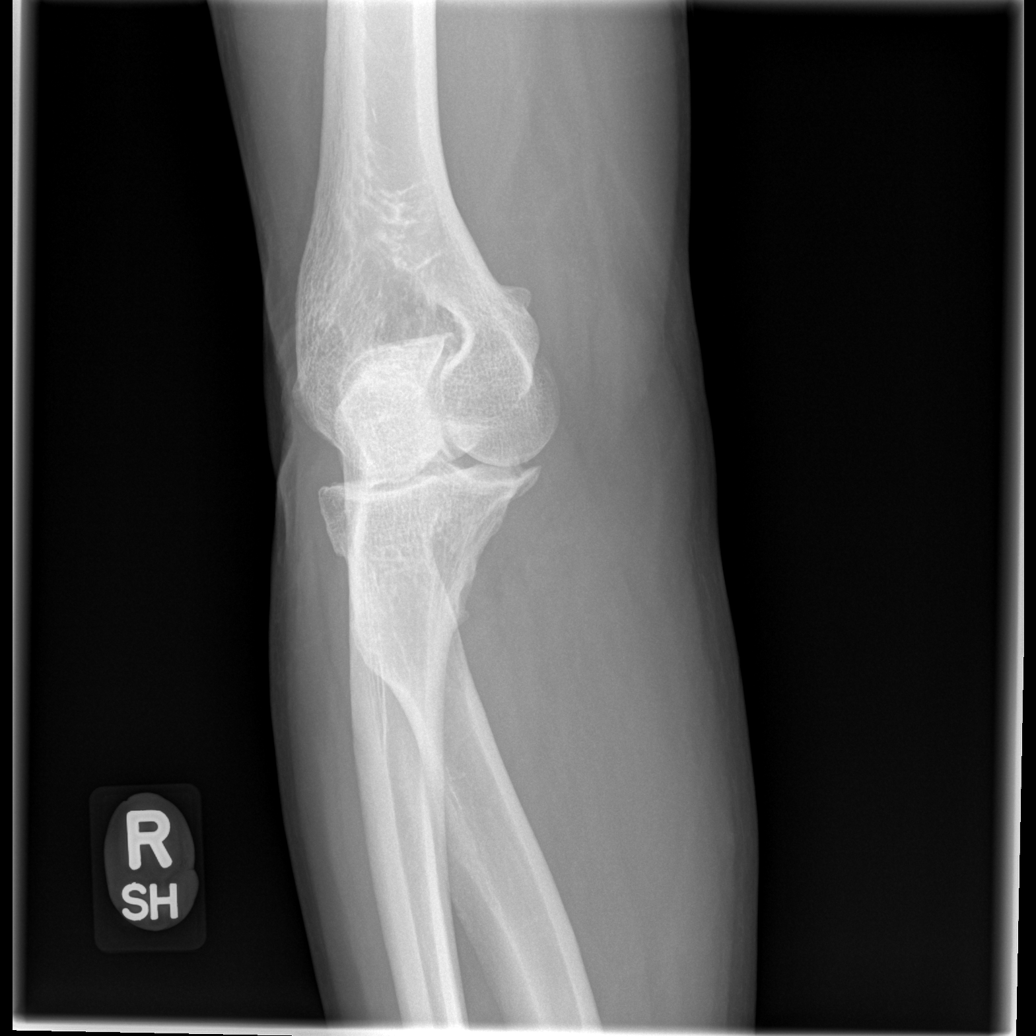

[x elbow joint obl. right (2 of 2)]
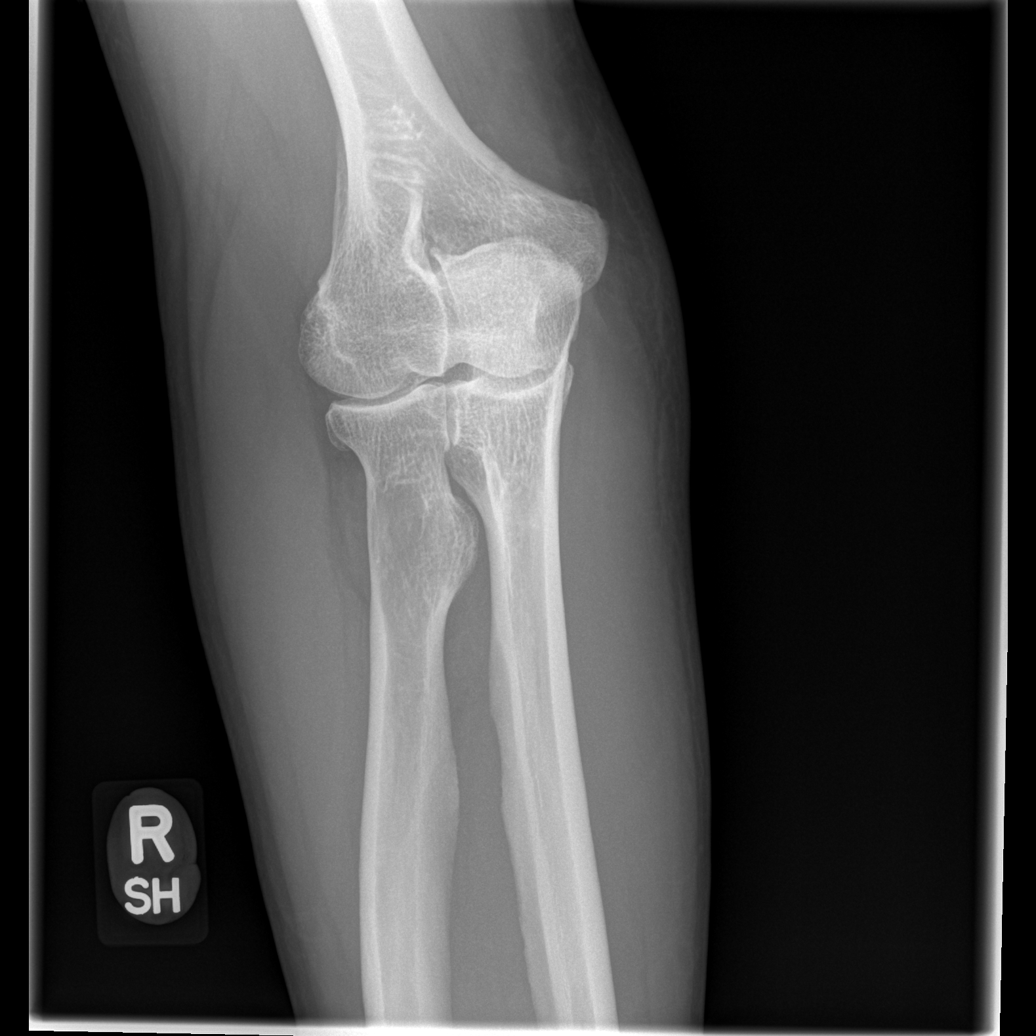

[x elbow joint lat right]
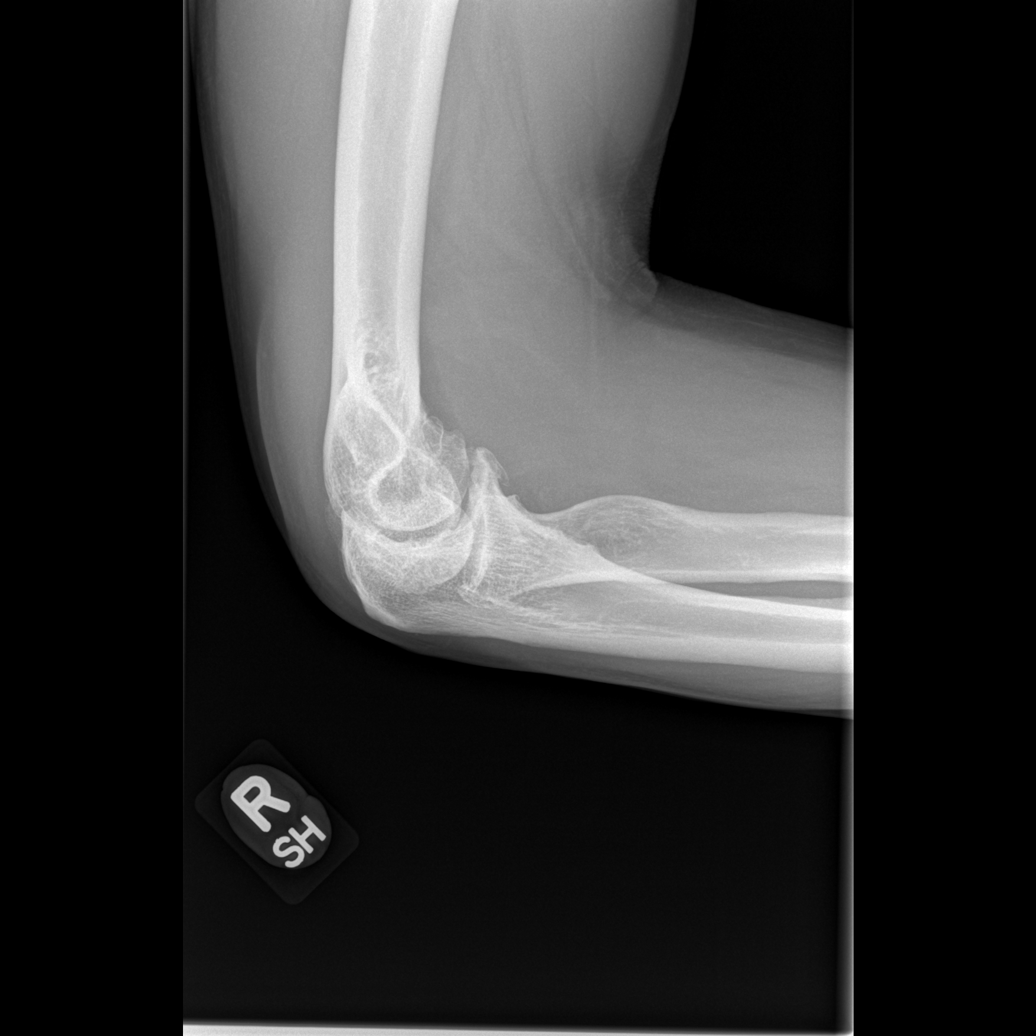

[4 of 4 positions shown; findings below may reference images not displayed]

FINDINGS: Moderate age advanced degenerative changes with joint space
narrowing and spurring. No acute fracture or osteochondral lesion.
No definite joint effusion.
IMPRESSION: Age advanced degenerative changes but no acute bony findings or
joint effusion.

## 2020-08-28 MED ORDER — HYDROCHLOROTHIAZIDE 12.5 MG PO TABS: 12.5000 mg | ORAL_TABLET | Freq: Every day | ORAL | 2 refills | Status: DC

## 2020-08-28 MED ORDER — SILDENAFIL CITRATE 20 MG PO TABS: 20.0000 mg | ORAL_TABLET | Freq: Every day | ORAL | 2 refills | Status: DC | PRN

## 2020-08-28 NOTE — Progress Notes (Signed)
Patient ID: Gabriel [name], male    DOB: [birth year]  Age: 70 y.o. MRN: [id number]    Subjective:  Subjective  HPI Gabriel [name] presents for office visit today for follow up on HTN and weight management. He reports that his recent BP readings have been around 128-130/79-81 on average at home. He denies any chest pain, SOB, fever, abdominal pain, cough, chills, sore throat, dysuria, urinary incontinence, back pain, HA, or N/VD. He endorses that he has cut his caloric intake in half, but he states that he still struggles to lose weight. He endorses joining a golfing club once a day for exercise. He states that he has no recent ER visits or recent hospitalizations to report.   Review of Systems  Constitutional: Negative for chills, fatigue and fever.  HENT: Negative for congestion, rhinorrhea, sinus pressure, sinus pain and sore throat.   Eyes: Negative for pain.  Respiratory: Negative for cough and shortness of breath.   Cardiovascular: Negative for chest pain, palpitations and leg swelling.  Gastrointestinal: Negative for abdominal pain, blood in stool, diarrhea, nausea and vomiting.  Genitourinary: Negative for flank pain, frequency and penile pain.  Musculoskeletal: Negative for back pain.  Neurological: Negative for headaches.    History Past Medical History:  Diagnosis Date  . Achalasia   . Arthritis   . Colon cancer screening 08/11/2014   Sees [facility], Dr Carlean Purl   . Colon polyp 08/11/2014   Sees [facility], Dr Carlean Purl    . Eczema 01/09/2014  . Esophageal reflux 05/21/2012  . Hyperlipidemia   . Hypertension   . Insomnia 01/09/2014  . Left hip pain 08/11/2014  . OA (osteoarthritis) of hip    right  . Otitis, externa, infective 03/31/2015  . Overweight 11/14/2012  . Right hip pain 08/11/2014   Sees chiropractor, Dr Clovis Riley in [place]   . Skin lesion of left leg 10/07/2015  . Tinnitus 10/12/2016  . Ulcer    esophageal ulcer hx  . Wears contact lenses     He has a past surgical history that  includes Heller myotomy (1999); Tonsillectomy and adenoidectomy (1963); Colonoscopy; Esophagogastroduodenoscopy; Upper gastrointestinal endoscopy; Tonsillectomy; Total hip arthroplasty (Left, 03/16/2016); and Total hip arthroplasty (Right, 05/03/2017).   His family history includes Alcohol abuse in his father and mother; Arthritis in his sister; Depression in his sister; Diabetes in his maternal grandmother; Diverticulosis in his sister; Healthy in his sister, sister, and sister; Heart disease in his brother; Hypertension in his sister; Lung cancer in his father; Mental illness in his sister; Ovarian cancer in his mother.He reports that he quit smoking about 29 years ago. His smoking use included cigarettes. He has never used smokeless tobacco. He reports current alcohol use of about 7.0 standard drinks of alcohol per week. He reports that he does not use drugs.  Current Outpatient Medications on File Prior to Visit  Medication Sig Dispense Refill  . enalapril (VASOTEC) 20 MG tablet Take 1 tablet (20 mg total) by mouth daily. 90 tablet 3  . Multiple Vitamins-Minerals (ANTIOXIDANT VITAMINS) TABS Take 1 tablet by mouth daily. Life Guard    . mupirocin ointment (BACTROBAN) 2 % Apply to b/l nares qhs x 7 days then as needed 22 g 1  . omeprazole (PRILOSEC) 40 MG capsule Take 1 capsule (40 mg total) by mouth daily. 90 capsule 0  . pimecrolimus (ELIDEL) 1 % cream Apply 1 application topically daily. (Patient taking differently: Apply 1 application topically 3 (three) times daily as needed (rash).) 60 g 5  .  pyridostigmine (MESTINON) 60 MG/5ML solution Take 2.5 mLs by mouth 3 (three) times daily.  12  . Red Yeast Rice Extract 600 MG TABS Take 1 tablet by mouth daily.      No current facility-administered medications on file prior to visit.     Objective:  Objective  Physical Exam Constitutional:      General: He is not in acute distress.    Appearance: Normal appearance. He is not ill-appearing or  toxic-appearing.  HENT:     Head: Normocephalic and atraumatic.     Right Ear: Tympanic membrane, ear canal and external ear normal.     Left Ear: Tympanic membrane, ear canal and external ear normal.     Nose: No congestion or rhinorrhea.  Eyes:     Extraocular Movements: Extraocular movements intact.     Pupils: Pupils are equal, round, and reactive to light.  Cardiovascular:     Rate and Rhythm: Normal rate and regular rhythm.     Pulses: Normal pulses.     Heart sounds: Normal heart sounds. No murmur heard.   Pulmonary:     Effort: Pulmonary effort is normal. No respiratory distress.     Breath sounds: Normal breath sounds. No wheezing, rhonchi or rales.  Abdominal:     General: Bowel sounds are normal.     Palpations: Abdomen is soft. There is no mass.     Tenderness: There is no abdominal tenderness. There is no guarding.     Hernia: No hernia is present.  Musculoskeletal:        General: Normal range of motion.     Cervical back: Normal range of motion and neck supple.  Skin:    General: Skin is warm and dry.  Neurological:     Mental Status: He is alert and oriented to person, place, and time.  Psychiatric:        Behavior: Behavior normal.    BP 136/88   Pulse (!) 101   Temp 98.6 F (37 C)   Resp 18   Wt 205 lb 9.6 oz (93.3 kg)   SpO2 99%   BMI 33.18 kg/m  Wt Readings from Last 3 Encounters:  08/28/20 205 lb 9.6 oz (93.3 kg)  03/04/20 204 lb (92.5 kg)  01/22/20 205 lb 6.4 oz (93.2 kg)     Lab Results  Component Value Date   WBC 8.0 08/28/2020   HGB 15.3 08/28/2020   HCT 44.3 08/28/2020   PLT 266.0 08/28/2020   GLUCOSE 108 (H) 08/28/2020   CHOL 191 08/28/2020   TRIG 86.0 08/28/2020   HDL 57.70 08/28/2020   LDLCALC 116 (H) 08/28/2020   ALT 38 08/28/2020   AST 25 08/28/2020   NA 130 (L) 08/28/2020   K 4.5 08/28/2020   CL 93 (L) 08/28/2020   CREATININE 0.98 08/28/2020   BUN 11 08/28/2020   CO2 29 08/28/2020   TSH 0.80 08/28/2020   PSA 3.68  03/04/2020   HGBA1C 6.2 08/28/2020   MICROALBUR <0.7 12/19/2017    DG Chest 2 View  Result Date: 01/22/2020 CLINICAL DATA:  Cough for several weeks EXAM: CHEST - 2 VIEW COMPARISON:  None. FINDINGS: Cardiac shadow is at the upper limits of normal in size. Thoracic aorta is tortuous in nature. Lungs are well aerated bilaterally without focal infiltrate or effusion. No bony abnormality is noted. IMPRESSION: No active cardiopulmonary disease. Electronically Signed   By: Inez Catalina M.D.   On: 01/22/2020 15:56     Assessment &  Plan:  Plan   Meds ordered this encounter  Medications  . hydrochlorothiazide (HYDRODIURIL) 12.5 MG tablet    Sig: Take 1 tablet (12.5 mg total) by mouth daily.    Dispense:  90 tablet    Refill:  2  . sildenafil (REVATIO) 20 MG tablet    Sig: Take 1-5 tablets (20-100 mg total) by mouth daily as needed.    Dispense:  30 tablet    Refill:  2    Problem List Items Addressed This Visit    HTN (hypertension)    Well controlled, no changes to meds. Encouraged heart healthy diet such as the DASH diet and exercise as tolerated.       Relevant Medications   hydrochlorothiazide (HYDRODIURIL) 12.5 MG tablet   sildenafil (REVATIO) 20 MG tablet   Hyperlipidemia    Encouraged heart healthy diet, increase exercise, avoid trans fats, consider a krill oil cap daily      Relevant Medications   hydrochlorothiazide (HYDRODIURIL) 12.5 MG tablet   sildenafil (REVATIO) 20 MG tablet   Other Relevant Orders   CBC with Differential/Platelet (Completed)   Comprehensive metabolic panel (Completed)   TSH (Completed)   Lipid panel (Completed)   Esophageal stricture    Continues to follow closely with [facility] he is doing well.      Insomnia    Encouraged good sleep hygiene such as dark, quiet room. No blue/green glowing lights such as computer screens in bedroom. No alcohol or stimulants in evening. Cut down on caffeine as able. Regular exercise is helpful but not just prior  to bed time.       Hyperglycemia - Primary    hgba1c acceptable, minimize simple carbs. Increase exercise as tolerated.       Relevant Orders   Hemoglobin A1c (Completed)   Erectile dysfunction    Will try Sildenafil 20 mg tabs, 1-5 mg daily prn ED. Sent to [facility]       Other Visit Diagnoses    Essential hypertension       Relevant Medications   hydrochlorothiazide (HYDRODIURIL) 12.5 MG tablet   sildenafil (REVATIO) 20 MG tablet   Other Relevant Orders   CBC with Differential/Platelet (Completed)   Comprehensive metabolic panel (Completed)   TSH (Completed)   Lipid panel (Completed)      Follow-up: Return in about 32 weeks (around 04/09/2021) for annual exam.   I,David Hanna,acting as a scribe for Penni Homans, MD.,have documented all relevant documentation on the behalf of Penni Homans, MD,as directed by  Penni Homans, MD while in the presence of Penni Homans, MD.  I, Mosie Lukes, MD personally performed the services described in this documentation. All medical record entries made by the scribe were at my direction and in my presence. I have reviewed the chart and agree that the record reflects my personal performance and is accurate and complete

## 2020-08-28 NOTE — Patient Instructions (Addendum)
MIND diet  Shingrix is the new shingles shot, 2 shots over 2-6 months, confirm coverage with insurance and document, then can return here for shots with nurse appt or at pharmacy  [facility] or [place] injectables for weight loss  Hypertension, Adult High blood pressure (hypertension) is when the force of blood pumping through the arteries is too strong. The arteries are the blood vessels that carry blood from the heart throughout the body. Hypertension forces the heart to work harder to pump blood and may cause arteries to become narrow or stiff. Untreated or uncontrolled hypertension can cause a heart attack, heart failure, a stroke, kidney disease, and other problems. A blood pressure reading consists of a higher number over a lower number. Ideally, your blood pressure should be below 120/80. The first ("top") number is called the systolic pressure. It is a measure of the pressure in your arteries as your heart beats. The second ("bottom") number is called the diastolic pressure. It is a measure of the pressure in your arteries as the heart relaxes. What are the causes? The exact cause of this condition is not known. There are some conditions that result in or are related to high blood pressure. What increases the risk? Some risk factors for high blood pressure are under your control. The following factors may make you more likely to develop this condition:  Smoking.  Having type 2 diabetes mellitus, high cholesterol, or both.  Not getting enough exercise or physical activity.  Being overweight.  Having too much fat, sugar, calories, or salt (sodium) in your diet.  Drinking too much alcohol. Some risk factors for high blood pressure may be difficult or impossible to change. Some of these factors include:  Having chronic kidney disease.  Having a family history of high blood pressure.  Age. Risk increases with age.  Race. You may be at higher risk if you are African  American.  Gender. Men are at higher risk than women before age 65. After age 68, women are at higher risk than men.  Having obstructive sleep apnea.  Stress. What are the signs or symptoms? High blood pressure may not cause symptoms. Very high blood pressure (hypertensive crisis) may cause:  Headache.  Anxiety.  Shortness of breath.  Nosebleed.  Nausea and vomiting.  Vision changes.  Severe chest pain.  Seizures. How is this diagnosed? This condition is diagnosed by measuring your blood pressure while you are seated, with your arm resting on a flat surface, your legs uncrossed, and your feet flat on the floor. The cuff of the blood pressure monitor will be placed directly against the skin of your upper arm at the level of your heart. It should be measured at least twice using the same arm. Certain conditions can cause a difference in blood pressure between your right and left arms. Certain factors can cause blood pressure readings to be lower or higher than normal for a short period of time:  When your blood pressure is higher when you are in a health care provider's office than when you are at home, this is called white coat hypertension. Most people with this condition do not need medicines.  When your blood pressure is higher at home than when you are in a health care provider's office, this is called masked hypertension. Most people with this condition may need medicines to control blood pressure. If you have a high blood pressure reading during one visit or you have normal blood pressure with other risk factors, you  may be asked to:  Return on a different day to have your blood pressure checked again.  Monitor your blood pressure at home for 1 week or longer. If you are diagnosed with hypertension, you may have other blood or imaging tests to help your health care provider understand your overall risk for other conditions. How is this treated? This condition is treated by  making healthy lifestyle changes, such as eating healthy foods, exercising more, and reducing your alcohol intake. Your health care provider may prescribe medicine if lifestyle changes are not enough to get your blood pressure under control, and if:  Your systolic blood pressure is above 130.  Your diastolic blood pressure is above 80. Your personal target blood pressure may vary depending on your medical conditions, your age, and other factors. Follow these instructions at home: Eating and drinking  Eat a diet that is high in fiber and potassium, and low in sodium, added sugar, and fat. An example eating plan is called the DASH (Dietary Approaches to Stop Hypertension) diet. To eat this way: ? Eat plenty of fresh fruits and vegetables. Try to fill one half of your plate at each meal with fruits and vegetables. ? Eat whole grains, such as whole-wheat pasta, brown rice, or whole-grain bread. Fill about one fourth of your plate with whole grains. ? Eat or drink low-fat dairy products, such as skim milk or low-fat yogurt. ? Avoid fatty cuts of meat, processed or cured meats, and poultry with skin. Fill about one fourth of your plate with lean proteins, such as fish, chicken without skin, beans, eggs, or tofu. ? Avoid pre-made and processed foods. These tend to be higher in sodium, added sugar, and fat.  Reduce your daily sodium intake. Most people with hypertension should eat less than 1,500 mg of sodium a day.  Do not drink alcohol if: ? Your health care provider tells you not to drink. ? You are pregnant, may be pregnant, or are planning to become pregnant.  If you drink alcohol: ? Limit how much you use to:  0-1 drink a day for women.  0-2 drinks a day for men. ? Be aware of how much alcohol is in your drink. In the [place] one drink equals one 12 oz bottle of beer (355 mL), one 5 oz glass of wine (148 mL), or one 1 oz glass of hard liquor (44 mL).   Lifestyle  Work with your health  care provider to maintain a healthy body weight or to lose weight. Ask what an ideal weight is for you.  Get at least 30 minutes of exercise most days of the week. Activities may include walking, swimming, or biking.  Include exercise to strengthen your muscles (resistance exercise), such as Pilates or lifting weights, as part of your weekly exercise routine. Try to do these types of exercises for 30 minutes at least 3 days a week.  Do not use any products that contain nicotine or tobacco, such as cigarettes, e-cigarettes, and chewing tobacco. If you need help quitting, ask your health care provider.  Monitor your blood pressure at home as told by your health care provider.  Keep all follow-up visits as told by your health care provider. This is important.   Medicines  Take over-the-counter and prescription medicines only as told by your health care provider. Follow directions carefully. Blood pressure medicines must be taken as prescribed.  Do not skip doses of blood pressure medicine. Doing this puts you at risk for problems  and can make the medicine less effective.  Ask your health care provider about side effects or reactions to medicines that you should watch for. Contact a health care provider if you:  Think you are having a reaction to a medicine you are taking.  Have headaches that keep coming back (recurring).  Feel dizzy.  Have swelling in your ankles.  Have trouble with your vision. Get help right away if you:  Develop a severe headache or confusion.  Have unusual weakness or numbness.  Feel faint.  Have severe pain in your chest or abdomen.  Vomit repeatedly.  Have trouble breathing. Summary  Hypertension is when the force of blood pumping through your arteries is too strong. If this condition is not controlled, it may put you at risk for serious complications.  Your personal target blood pressure may vary depending on your medical conditions, your age, and  other factors. For most people, a normal blood pressure is less than 120/80.  Hypertension is treated with lifestyle changes, medicines, or a combination of both. Lifestyle changes include losing weight, eating a healthy, low-sodium diet, exercising more, and limiting alcohol. This information is not intended to replace advice given to you by your health care provider. Make sure you discuss any questions you have with your health care provider. Document Revised: 12/07/2017 Document Reviewed: 12/07/2017 Elsevier Patient Education  2021 [employer].

## 2020-08-29 LAB — COMPREHENSIVE METABOLIC PANEL
ALT: 38 U/L (ref 0–53)
AST: 25 U/L (ref 0–37)
Albumin: 4.6 g/dL (ref 3.5–5.2)
Alkaline Phosphatase: 48 U/L (ref 39–117)
BUN: 11 mg/dL (ref 6–23)
CO2: 29 mEq/L (ref 19–32)
Calcium: 9.6 mg/dL (ref 8.4–10.5)
Chloride: 93 mEq/L — ABNORMAL LOW (ref 96–112)
Creatinine, Ser: 0.98 mg/dL (ref 0.40–1.50)
GFR: 79.14 mL/min (ref >60.00)
Glucose, Bld: 108 mg/dL — ABNORMAL HIGH (ref 70–99)
Potassium: 4.5 mEq/L (ref 3.5–5.1)
Sodium: 130 mEq/L — ABNORMAL LOW (ref 135–145)
Total Bilirubin: 0.7 mg/dL (ref 0.2–1.2)
Total Protein: 7 g/dL (ref 6.0–8.3)

## 2020-08-29 LAB — CBC WITH DIFFERENTIAL/PLATELET
Basophils Absolute: 0.1 10*3/uL (ref 0.0–0.1)
Basophils Relative: 0.7 % (ref 0.0–3.0)
Eosinophils Absolute: 0.1 10*3/uL (ref 0.0–0.7)
Eosinophils Relative: 1 % (ref 0.0–5.0)
HCT: 44.3 % (ref 39.0–52.0)
Hemoglobin: 15.3 g/dL (ref 13.0–17.0)
Lymphocytes Relative: 18 % (ref 12.0–46.0)
Lymphs Abs: 1.4 10*3/uL (ref 0.7–4.0)
MCHC: 34.6 g/dL (ref 30.0–36.0)
MCV: 92.6 fl (ref 78.0–100.0)
Monocytes Absolute: 0.6 10*3/uL (ref 0.1–1.0)
Monocytes Relative: 8 % (ref 3.0–12.0)
Neutro Abs: 5.8 10*3/uL (ref 1.4–7.7)
Neutrophils Relative %: 72.3 % (ref 43.0–77.0)
Platelets: 266 10*3/uL (ref 150.0–400.0)
RBC: 4.78 Mil/uL (ref 4.22–5.81)
RDW: 12.7 % (ref 11.5–15.5)
WBC: 8 10*3/uL (ref 4.0–10.5)

## 2020-08-29 LAB — LIPID PANEL
Cholesterol: 191 mg/dL (ref 0–200)
HDL: 57.7 mg/dL (ref >39.00)
LDL Cholesterol: 116 mg/dL — ABNORMAL HIGH (ref 0–99)
NonHDL: 132.98
Total CHOL/HDL Ratio: 3
Triglycerides: 86 mg/dL (ref 0.0–149.0)
VLDL: 17.2 mg/dL (ref 0.0–40.0)

## 2020-08-29 LAB — TSH: TSH: 0.8 u[IU]/mL (ref 0.35–4.50)

## 2020-08-29 LAB — HEMOGLOBIN A1C: Hgb A1c MFr Bld: 6.2 % (ref 4.6–6.5)

## 2020-08-30 DIAGNOSIS — N529 Male erectile dysfunction, unspecified: Secondary | ICD-10-CM | POA: Insufficient documentation

## 2020-08-30 NOTE — Assessment & Plan Note (Signed)
Will try Sildenafil 20 mg tabs, 1-5 mg daily prn ED. Sent to [facility]

## 2020-08-30 NOTE — Assessment & Plan Note (Signed)
Well controlled, no changes to meds. Encouraged heart healthy diet such as the DASH diet and exercise as tolerated.  °

## 2020-08-30 NOTE — Assessment & Plan Note (Signed)
Encouraged heart healthy diet, increase exercise, avoid trans fats, consider a krill oil cap daily 

## 2020-08-30 NOTE — Assessment & Plan Note (Signed)
hgba1c acceptable, minimize simple carbs. Increase exercise as tolerated.  

## 2020-08-30 NOTE — Assessment & Plan Note (Signed)
Encouraged good sleep hygiene such as dark, quiet room. No blue/green glowing lights such as computer screens in bedroom. No alcohol or stimulants in evening. Cut down on caffeine as able. Regular exercise is helpful but not just prior to bed time.  

## 2020-08-30 NOTE — Assessment & Plan Note (Addendum)
Continues to follow closely with [facility] he is doing well.

## 2020-11-06 ENCOUNTER — Other Ambulatory Visit: Payer: Self-pay | Admitting: Family Medicine

## 2020-11-06 DIAGNOSIS — K222 Esophageal obstruction: Secondary | ICD-10-CM

## 2020-11-06 DIAGNOSIS — K220 Achalasia of cardia: Secondary | ICD-10-CM

## 2020-11-06 DIAGNOSIS — K2100 Gastro-esophageal reflux disease with esophagitis, without bleeding: Secondary | ICD-10-CM

## 2020-11-06 DIAGNOSIS — R1310 Dysphagia, unspecified: Secondary | ICD-10-CM

## 2020-11-26 ENCOUNTER — Ambulatory Visit: Payer: Medicare Other | Admitting: Orthopedic Surgery

## 2020-11-26 ENCOUNTER — Ambulatory Visit (INDEPENDENT_AMBULATORY_CARE_PROVIDER_SITE_OTHER): Payer: Medicare Other

## 2020-11-26 ENCOUNTER — Other Ambulatory Visit: Payer: Self-pay

## 2020-11-26 DIAGNOSIS — M25511 Pain in right shoulder: Secondary | ICD-10-CM | POA: Diagnosis not present

## 2020-12-01 ENCOUNTER — Encounter: Payer: Self-pay | Admitting: Orthopedic Surgery

## 2020-12-01 NOTE — Progress Notes (Signed)
Office Visit Note   Patient: Gabriel [name]           Date of Birth: 1950/07/22           MRN: [id number] Visit Date: 11/26/2020 Requested by: Mosie Lukes, MD [address] [place],  [place] [id number] PCP: Mosie Lukes, MD  Subjective: Chief Complaint  Patient presents with   Right Shoulder - Pain    HPI: Gabriel [name] is a 70 y.o. male who presents to the office complaining of right shoulder pain.  Patient complains of shoulder pain with onset 1 month ago.  Denies any known injury.  Localizes pain to the anterior aspect of the shoulder with radiation to the bicep.  No radiation to the hand, radicular pain, numbness/tingling.  No neck pain.  He does have some "tension" in the shoulder blade but no significant pain between his shoulder blades.  No history of prior shoulder or neck surgery.  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  He enjoys golfing in his free time and he has not been able to play golf in the last 2 weeks due to the severe pain that he has afterward.  Wakes with pain every night.  He denies any weakness of the arm but he does note that lifting things away from his body, even as small as a coffee cup causes pain..                ROS: All systems reviewed are negative as they relate to the chief complaint within the history of present illness.  Patient denies fevers or chills.  Assessment & Plan: Visit Diagnoses:  1. Right shoulder pain, unspecified chronicity     Plan: Patient is a 70 year old male who presents complaining of right shoulder pain over the last month.  He has had no injury leading to the onset of his symptoms but has had quite severe symptoms over the last several weeks to the point where he is waking up with pain every night.  He has weakness of external rotation on exam today.  Radiographs are negative for any pathology to explain his pain but there is some suggestion of early superior migration of the  humeral head.  No fracture or dislocation noted.  With patient's severe symptoms at night and no weakness on exam, ordered MRI arthrogram of the right shoulder for further evaluation of potential rotator cuff tear.  Follow-up after MRI to review results.  Follow-Up Instructions: No follow-ups on file.   Orders:  Orders Placed This Encounter  Procedures   XR Shoulder Right   MR SHOULDER RIGHT W CONTRAST   Arthrogram   No orders of the defined types were placed in this encounter.     Procedures: No procedures performed   Clinical Data: No additional findings.  Objective: Vital Signs: There were no vitals taken for this visit.  Physical Exam:  Constitutional: Patient appears well-developed HEENT:  Head: Normocephalic Eyes:EOM are normal Neck: Normal range of motion Cardiovascular: Normal rate Pulmonary/chest: Effort normal Neurologic: Patient is alert Skin: Skin is warm Psychiatric: Patient has normal mood and affect  Ortho Exam: Ortho exam demonstrates right shoulder with 50 degrees external rotation, 90 degrees abduction, 165 degrees forward flexion.  This compared with the left shoulder with 40 degrees external rotation, 90 degrees abduction, 150 degrees forward flexion.  Excellent rotator cuff strength of left shoulder.  Excellent strength of supra, subscap of the right shoulder with  weakness of the right shoulder external rotation.  He has pain with resisted external rotation and supraspinatus strength testing of the right shoulder.  No crepitus noted.  Active range of motion equivalent to passive range of motion.  Mild tenderness to palpation over the [facility] joint.  Negative Hornblower sign.  Negative belly press test.  5/5 motor strength of bilateral grip strength, finger abduction, pronation/supination, bicep, tricep, deltoid.  Specialty Comments:  No specialty comments available.  Imaging: No results found.   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   Skin lesion of left leg 10/07/2015   Otitis, externa, infective 03/31/2015   Right hip pain 08/11/2014   Colon polyp 08/11/2014   Insomnia 01/09/2014   Hyperglycemia 01/09/2014   Eczema 01/09/2014   Enlarged prostate 01/30/2013   Esophageal stricture 01/30/2013   Esophageal ulcer 01/30/2013   Overweight 11/14/2012   Esophageal reflux 05/21/2012   Hyperlipidemia 01/09/2012   Preventative health care 09/29/2011   Epicondylitis elbow, medial 04/20/2011   HTN (hypertension) 03/27/2011   Achalasia 03/27/2011   Past Medical History:  Diagnosis Date   Achalasia    Arthritis    Colon cancer screening 08/11/2014   Sees [facility], Dr Carlean Purl    Colon polyp 08/11/2014   Sees [facility], Dr Carlean Purl     Eczema 01/09/2014   Esophageal reflux 05/21/2012   Hyperlipidemia    Hypertension    Insomnia 01/09/2014   Left hip pain 08/11/2014   OA (osteoarthritis) of hip    right   Otitis, externa, infective 03/31/2015   Overweight 11/14/2012   Right hip pain 08/11/2014   Sees chiropractor, Dr Clovis [place] in [place]    Skin lesion of left leg 10/07/2015   Tinnitus 10/12/2016   Ulcer    esophageal ulcer hx   Wears contact lenses     Family History  Problem Relation Age of Onset   Alcohol abuse Mother    Ovarian cancer Mother    Diabetes Maternal Grandmother    Alcohol abuse Father    Lung cancer Father    Mental illness Sister    Depression Sister    Heart disease Brother        congenital heart   Arthritis Sister    Hypertension Sister    Diverticulosis Sister    Healthy Sister    Healthy Sister    Healthy Sister    Colon cancer Neg Hx    Esophageal cancer Neg Hx    Rectal cancer Neg Hx     Stomach cancer Neg Hx     Past Surgical History:  Procedure Laterality Date   COLONOSCOPY     ESOPHAGOGASTRODUODENOSCOPY     HELLER MYOTOMY  1999   help with swallowing   TONSILLECTOMY     TONSILLECTOMY AND ADENOIDECTOMY  1963   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   Procedure: LEFT TOTAL HIP ARTHROPLASTY ANTERIOR APPROACH;  Surgeon: Mcarthur Rossetti, MD;  Location: [place];  Service: Orthopedics;  Laterality: Left;   TOTAL HIP ARTHROPLASTY Right 05/03/2017   Procedure: RIGHT TOTAL HIP ARTHROPLASTY ANTERIOR APPROACH;  Surgeon: Mcarthur Rossetti, MD;  Location: [place];  Service: Orthopedics;  Laterality: Right;   UPPER GASTROINTESTINAL ENDOSCOPY  Social History   Occupational History   Occupation: sales/distribution    Employer: [employer]  Tobacco Use   Smoking status: Former    Types: Cigarettes    Quit date: 04/13/1991    Years since quitting: 29.6   Smokeless tobacco: Never   Tobacco comments:    1 ppd for 25 years quit 93'  Vaping Use   Vaping Use: Never used  Substance and Sexual Activity   Alcohol use: Yes    Alcohol/week: 7.0 standard drinks    Types: 7 Glasses of wine per week    Comment: Wine nightly   Drug use: No   Sexual activity: Not on file

## 2020-12-03 ENCOUNTER — Encounter: Payer: Self-pay | Admitting: Orthopedic Surgery

## 2020-12-08 ENCOUNTER — Ambulatory Visit (INDEPENDENT_AMBULATORY_CARE_PROVIDER_SITE_OTHER): Payer: Medicare Other

## 2020-12-08 VITALS — Ht 66.0 in | Wt 205.0 lb

## 2020-12-08 DIAGNOSIS — Z0000 Encounter for general adult medical examination without abnormal findings: Secondary | ICD-10-CM

## 2020-12-08 DIAGNOSIS — M75121 Complete rotator cuff tear or rupture of right shoulder, not specified as traumatic: Secondary | ICD-10-CM | POA: Insufficient documentation

## 2020-12-08 NOTE — Progress Notes (Signed)
Subjective:   Gabriel [name] is a 70 y.o. male who presents for an Initial Medicare Annual Wellness Visit.  I connected with  Gabriel [name] today by a video enabled telemedicine application and verified that I am speaking with the correct person using two identifiers.  Location of patient:Home Location of provider:Work  Persons participating in the virtual visit: patient, nurse.   I discussed the limitations, risk, security and privacy concerns of evaluation and management by telemedicine. The patient expressed understanding and agreed to proceed.  Some vital signs may be absent or patient reported.   Review of Systems     Cardiac Risk Factors include: advanced age (>54mn, >>5women);dyslipidemia;hypertension;sedentary lifestyle;obesity (BMI >30kg/m2);male gender     Objective:    Today's Vitals   12/08/20 0900  Weight: 205 lb (93 kg)  Height: '5\' 6"'$  (1.676 m)   Body mass index is 33.09 kg/m.  Advanced Directives 12/08/2020 04/26/2017 03/08/2016  Does Patient Have a Medical Advance Directive? No No (No Data)  Would patient like information on creating a medical advance directive? No - Patient declined Yes (MAU/Ambulatory/Procedural Areas - Information given) -    Current Medications (verified) Outpatient Encounter Medications as of 12/08/2020  Medication Sig   enalapril (VASOTEC) 20 MG tablet Take 1 tablet (20 mg total) by mouth daily.   hydrochlorothiazide (HYDRODIURIL) 12.5 MG tablet Take 1 tablet (12.5 mg total) by mouth daily.   Multiple Vitamins-Minerals (ANTIOXIDANT VITAMINS) TABS Take 1 tablet by mouth daily. Life Guard   mupirocin ointment (BACTROBAN) 2 % Apply to b/l nares qhs x 7 days then as needed   omeprazole (PRILOSEC) 40 MG capsule TAKE 1 CAPSULE (40 MG TOTAL) BY MOUTH DAILY.   pimecrolimus (ELIDEL) 1 % cream Apply 1 application topically daily. (Patient taking differently: Apply 1 application topically 3 (three) times daily as needed (rash).)   pyridostigmine  (MESTINON) 60 MG/5ML solution Take 2.5 mLs by mouth 3 (three) times daily.   Red Yeast Rice Extract 600 MG TABS Take 1 tablet by mouth daily.    sildenafil (REVATIO) 20 MG tablet Take 1-5 tablets (20-100 mg total) by mouth daily as needed.   No facility-administered encounter medications on file as of 12/08/2020.    Allergies (verified) Patient has no known allergies.   History: Past Medical History:  Diagnosis Date   Achalasia    Arthritis    Colon cancer screening 08/11/2014   Sees [facility], Dr GCarlean Purl   Colon polyp 08/11/2014   Sees [facility], Dr GCarlean Purl    Eczema 01/09/2014   Esophageal reflux 05/21/2012   Hyperlipidemia    Hypertension    Insomnia 01/09/2014   Left hip pain 08/11/2014   OA (osteoarthritis) of hip    right   Otitis, externa, infective 03/31/2015   Overweight 11/14/2012   Right hip pain 08/11/2014   [place]   Skin lesion of left leg 10/07/2015   Tinnitus 10/12/2016   Ulcer    esophageal ulcer hx   Wears contact lenses    Past Surgical History:  Procedure Laterality Date   COLONOSCOPY     ESOPHAGOGASTRODUODENOSCOPY     HELLER MYOTOMY  1999   help with swallowing   TONSILLECTOMY     TONSILLECTOMY AND ADENOIDECTOMY  1963   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   Procedure: LEFT TOTAL HIP ARTHROPLASTY ANTERIOR APPROACH;  Surgeon: CMcarthur Rossetti MD;  [place]  Service: Orthopedics;  Laterality: Left;   TOTAL HIP ARTHROPLASTY Right 05/03/2017  Procedure: RIGHT TOTAL HIP ARTHROPLASTY ANTERIOR APPROACH;  Surgeon: Mcarthur Rossetti, MD;  Location: [place];  Service: Orthopedics;  Laterality: Right;   UPPER GASTROINTESTINAL ENDOSCOPY     Family History  Problem Relation Age of Onset   Alcohol abuse Mother    Ovarian cancer Mother    Diabetes Maternal Grandmother    Alcohol abuse Father    Lung cancer Father    Mental illness Sister    Depression Sister    Heart disease Brother        congenital heart   Arthritis Sister     Hypertension Sister    Diverticulosis Sister    Healthy Sister    Healthy Sister    Healthy Sister    Colon cancer Neg Hx    Esophageal cancer Neg Hx    Rectal cancer Neg Hx    Stomach cancer Neg Hx    Social History   Socioeconomic History   Marital status: Married    Spouse name: Not on file   Number of children: 2   Years of education: Not on file   Highest education level: Not on file  Occupational History   Occupation: sales/distribution    Employer: [employer]  Tobacco Use   Smoking status: Former    Types: Cigarettes    Quit date: 04/13/1991    Years since quitting: 29.6   Smokeless tobacco: Never   Tobacco comments:    1 ppd for 25 years quit 93'  Vaping Use   Vaping Use: Never used  Substance and Sexual Activity   Alcohol use: Yes    Alcohol/week: 7.0 standard drinks    Types: 7 Glasses of wine per week    Comment: Wine nightly   Drug use: No   Sexual activity: Not on file  Other Topics Concern   Not on file  Social History Narrative   Married 2 daughters   Scientific laboratory technician for [employer]         Social Determinants of Health   Financial Resource Strain: Low Risk    Difficulty of Paying Living Expenses: Not hard at all  Food Insecurity: No Food Insecurity   Worried About Charity fundraiser in the Last Year: Never true   Arboriculturist in the Last Year: Never true  Transportation Needs: No Transportation Needs   Lack of Transportation (Medical): No   Lack of Transportation (Non-Medical): No  Physical Activity: Inactive   Days of Exercise per Week: 0 days   Minutes of Exercise per Session: 0 min  Stress: No Stress Concern Present   Feeling of Stress : Not at all  Social Connections: Socially Integrated   Frequency of Communication with Friends and Family: More than three times a week   Frequency of Social Gatherings with Friends and Family: More than three times a week   Attends Religious Services: More than 4  times per year   Active Member of [employer] or Organizations: Yes   Attends Music therapist: More than 4 times per year   Marital Status: Married    Tobacco Counseling Counseling given: Not Answered Tobacco comments: 1 ppd for 25 years quit 93'   Clinical Intake:  Pre-visit preparation completed: Yes  Pain : No/denies pain     Nutritional Status: BMI > 30  Obese Nutritional Risks: None Diabetes: No  How often do you need to have someone help you when you read instructions, pamphlets, or other written  materials from your doctor or pharmacy?: 1 - Never  Diabetic?No  Interpreter Needed?: No  Information entered by :: Caroleen Hamman LPN   Activities of Daily Living In your present state of health, do you have any difficulty performing the following activities: 12/08/2020 08/28/2020  Hearing? N N  Vision? N N  Difficulty concentrating or making decisions? N N  Walking or climbing stairs? N N  Dressing or bathing? N N  Doing errands, shopping? N N  Preparing Food and eating ? N -  Using the Toilet? N -  In the past six months, have you accidently leaked urine? N -  Do you have problems with loss of bowel control? N -  Managing your Medications? N -  Managing your Finances? N -  Housekeeping or managing your Housekeeping? N -  Some recent data might be hidden    Patient Care Team: Mosie Lukes, MD as PCP - General (Family Medicine)  Indicate any recent Medical Services you may have received from other than [facility] providers in the past year (date may be approximate).     Assessment:   This is a routine wellness examination for Cj.  Hearing/Vision screen Hearing Screening - Comments:: No issues Vision Screening - Comments:: Last eye exam-12/2019  Dietary issues and exercise activities discussed: Current Exercise Habits: The patient does not participate in regular exercise at present, Exercise limited by: None identified   Goals Addressed              This Visit's Progress    Patient Stated       Join the [facility].       Depression Screen PHQ 2/9 Scores 12/08/2020 08/28/2020 12/19/2017 10/12/2016  PHQ - 2 Score 0 0 0 0    Fall Risk Fall Risk  12/08/2020 08/28/2020 12/19/2017 10/12/2016  Falls in the past year? 0 0 No No  Number falls in past yr: 0 0 - -  Injury with Fall? 0 0 - -  Follow up Falls prevention discussed - - -    FALL RISK PREVENTION PERTAINING TO THE HOME:  Any stairs in or around the home? No  Home free of loose throw rugs in walkways, pet beds, electrical cords, etc? Yes  Adequate lighting in your home to reduce risk of falls? Yes   ASSISTIVE DEVICES UTILIZED TO PREVENT FALLS:  Life alert? Yes  Use of a cane, walker or w/c? No  Grab bars in the bathroom? No  Shower chair or bench in shower? No  Elevated toilet seat or a handicapped toilet? No   TIMED UP AND GO:  Was the test performed? No . Virtual visit   Cognitive Function:Normal cognitive status assessed by direct observation by this Nurse Health Advisor. No abnormalities found.          Immunizations Immunization History  Administered Date(s) Administered   Fluad Quad(high Dose 65+) 02/09/2019, 01/22/2020   Influenza, High Dose Seasonal PF 12/19/2017   Influenza,inj,Quad PF,6+ Mos 03/31/2015, 03/17/2016   Influenza-Unspecified 03/31/2015   Moderna Sars-Covid-2 Vaccination 05/26/2019, 06/23/2019, 03/05/2020   Pneumococcal Conjugate-13 12/19/2017   Pneumococcal Polysaccharide-23 02/09/2019   Tdap 04/15/2008   Zoster, Live 12/03/2015    TDAP status: Due, Education has been provided regarding the importance of this vaccine. Advised may receive this vaccine at local pharmacy or Health Dept. Aware to provide a copy of the vaccination record if obtained from local pharmacy or Health Dept. Verbalized acceptance and understanding.  Flu Vaccine status: Due, Education has been provided  regarding the importance of this vaccine. Advised may receive this  vaccine at local pharmacy or Health Dept. Aware to provide a copy of the vaccination record if obtained from local pharmacy or Health Dept. Verbalized acceptance and understanding.  Pneumococcal vaccine status: Up to date  Covid-19 vaccine status: Information provided on how to obtain vaccines. Booster due  Qualifies for Shingles Vaccine? Yes   Zostavax completed Yes   Shingrix Completed?: No.    Education has been provided regarding the importance of this vaccine. Patient has been advised to call insurance company to determine out of pocket expense if they have not yet received this vaccine. Advised may also receive vaccine at local pharmacy or Health Dept. Verbalized acceptance and understanding.  Screening Tests Health Maintenance  Topic Date Due   Hepatitis C Screening  Never done   Zoster Vaccines- Shingrix (1 of 2) Never done   TETANUS/TDAP  04/15/2018   COVID-19 Vaccine (4 - Booster for Moderna series) 06/05/2020   INFLUENZA VACCINE  11/10/2020   COLONOSCOPY (Pts 45-16yr Insurance coverage will need to be confirmed)  01/31/2023   PNA vac Low Risk Adult  Completed   HPV VACCINES  Aged Out    Health Maintenance  Health Maintenance Due  Topic Date Due   Hepatitis C Screening  Never done   Zoster Vaccines- Shingrix (1 of 2) Never done   TETANUS/TDAP  04/15/2018   COVID-19 Vaccine (4 - Booster for Moderna series) 06/05/2020   INFLUENZA VACCINE  11/10/2020    Colorectal cancer screening: Type of screening: Colonoscopy. Completed 01/30/2013. Repeat every 10 years  Lung Cancer Screening: (Low Dose CT Chest recommended if Age 387-80years, 30 pack-year currently smoking OR have quit w/in 15years.) does not qualify.     Additional Screening:  Hepatitis C Screening: does qualify; Discuss with PCP  Vision Screening: Recommended annual ophthalmology exams for early detection of glaucoma and other disorders of the eye. Is the patient up to date with their annual eye exam?  Yes   Who is the provider or what is the name of the office in which the patient attends annual eye exams? Pt unsure of name   Dental Screening: Recommended annual dental exams for proper oral hygiene  Community Resource Referral / Chronic Care Management: CRR required this visit?  No   CCM required this visit?  No      Plan:     I have personally reviewed and noted the following in the patient's chart:   Medical and social history Use of alcohol, tobacco or illicit drugs  Current medications and supplements including opioid prescriptions. Patient is not currently taking opioid prescriptions. Functional ability and status Nutritional status Physical activity Advanced directives List of other physicians Hospitalizations, surgeries, and ER visits in previous 12 months Vitals Screenings to include cognitive, depression, and falls Referrals and appointments  In addition, I have reviewed and discussed with patient certain preventive protocols, quality metrics, and best practice recommendations. A written personalized care plan for preventive services as well as general preventive health recommendations were provided to patient.   Due to this being a virtual visit, the after visit summary with patients personalized plan was offered to patient via mail or my-chart. Patient would like to access on my-chart.   MMarta Antu LPN   [phone] Nurse Health Advisor  Nurse Notes: None

## 2020-12-08 NOTE — Patient Instructions (Signed)
Gabriel [name] , Thank you for taking time to complete your Medicare Wellness Visit. I appreciate your ongoing commitment to your health goals. Please review the following plan we discussed and let me know if I can assist you in the future.   Screening recommendations/referrals: Colonoscopy: Completed 01/30/2013-Due 01/31/2023 Recommended yearly ophthalmology/optometry visit for glaucoma screening and checkup Recommended yearly dental visit for hygiene and checkup  Vaccinations: Influenza vaccine: Due-May obtain vaccine at our office or your local pharmacy. Pneumococcal vaccine: Up to date Tdap vaccine: Discuss with pharmacy Shingles vaccine: Discuss with pharmacy   Covid-19: Booster due  Advanced directives: Please bring a copy for your chart once completed  Conditions/risks identified: See problem list  Next appointment: Follow up in one year for your annual wellness visit.   Preventive Care 69 Years and Older, Male Older, Male Preventive care refers to lifestyle choices and visits with your health care provider that can promote health and wellness. What does preventive care include? A yearly physical exam. This is also called an annual well check. Dental exams once or twice a year. Routine eye exams. Ask your health care provider how often you should have your eyes checked. Personal lifestyle choices, including: Daily care of your teeth and gums. Regular physical activity. Eating a healthy diet. Avoiding tobacco and drug use. Limiting alcohol use. Practicing safe sex. Taking low doses of aspirin every day. Taking vitamin and mineral supplements as recommended by your health care provider. What happens during an annual well check? The services and screenings done by your health care provider during your annual well check will depend on your age, overall health, lifestyle risk factors, and family history of disease. Counseling  Your health care provider may ask you questions about  your: Alcohol use. Tobacco use. Drug use. Emotional well-being. Home and relationship well-being. Sexual activity. Eating habits. History of falls. Memory and ability to understand (cognition). Work and work Statistician. Screening  You may have the following tests or measurements: Height, weight, and BMI. Blood pressure. Lipid and cholesterol levels. These may be checked every 5 years, or more frequently if you are over 69 years old. Skin check. Lung cancer screening. You may have this screening every year starting at age 69 if you have a 30-pack-year history of smoking and currently smoke or have quit within the past 15 years. Fecal occult blood test (FOBT) of the stool. You may have this test every year starting at age 69. Flexible sigmoidoscopy or colonoscopy. You may have a sigmoidoscopy every 5 years or a colonoscopy every 10 years starting at age 69. Prostate cancer screening. Recommendations will vary depending on your family history and other risks. Hepatitis C blood test. Hepatitis B blood test. Sexually transmitted disease (STD) testing. Diabetes screening. This is done by checking your blood sugar (glucose) after you have not eaten for a while (fasting). You may have this done every 1-3 years. Abdominal aortic aneurysm (AAA) screening. You may need this if you are a current or former smoker. Osteoporosis. You may be screened starting at age 69 if you are at high risk. Talk with your health care provider about your test results, treatment options, and if necessary, the need for more tests. Vaccines  Your health care provider may recommend certain vaccines, such as: Influenza vaccine. This is recommended every year. Tetanus, diphtheria, and acellular pertussis (Tdap, Td) vaccine. You may need a Td booster every 10 years. Zoster vaccine. You may need this after age 69. Pneumococcal 13-valent conjugate (PCV13) vaccine. One dose is  recommended after age 69. Pneumococcal  polysaccharide (PPSV23) vaccine. One dose is recommended after age 43. Talk to your health care provider about which screenings and vaccines you need and how often you need them. This information is not intended to replace advice given to you by your health care provider. Make sure you discuss any questions you have with your health care provider. Document Released: 04/25/2015 Document Revised: 12/17/2015 Document Reviewed: 01/28/2015 Elsevier Interactive Patient Education  2017 [place] Prevention in the Home Falls can cause injuries. They can happen to people of all ages. There are many things you can do to make your home safe and to help prevent falls. What can I do on the outside of my home? Regularly fix the edges of walkways and driveways and fix any cracks. Remove anything that might make you trip as you walk through a door, such as a raised step or threshold. Trim any bushes or trees on the path to your home. Use bright outdoor lighting. Clear any walking paths of anything that might make someone trip, such as rocks or tools. Regularly check to see if handrails are loose or broken. Make sure that both sides of any steps have handrails. Any raised decks and porches should have guardrails on the edges. Have any leaves, snow, or ice cleared regularly. Use sand or salt on walking paths during winter. Clean up any spills in your garage right away. This includes oil or grease spills. What can I do in the bathroom? Use night lights. Install grab bars by the toilet and in the tub and shower. Do not use towel bars as grab bars. Use non-skid mats or decals in the tub or shower. If you need to sit down in the shower, use a plastic, non-slip stool. Keep the floor dry. Clean up any water that spills on the floor as soon as it happens. Remove soap buildup in the tub or shower regularly. Attach bath mats securely with double-sided non-slip rug tape. Do not have throw rugs and other  things on the floor that can make you trip. What can I do in the bedroom? Use night lights. Make sure that you have a light by your bed that is easy to reach. Do not use any sheets or blankets that are too big for your bed. They should not hang down onto the floor. Have a firm chair that has side arms. You can use this for support while you get dressed. Do not have throw rugs and other things on the floor that can make you trip. What can I do in the kitchen? Clean up any spills right away. Avoid walking on wet floors. Keep items that you use a lot in easy-to-reach places. If you need to reach something above you, use a strong step stool that has a grab bar. Keep electrical cords out of the way. Do not use floor polish or wax that makes floors slippery. If you must use wax, use non-skid floor wax. Do not have throw rugs and other things on the floor that can make you trip. What can I do with my stairs? Do not leave any items on the stairs. Make sure that there are handrails on both sides of the stairs and use them. Fix handrails that are broken or loose. Make sure that handrails are as long as the stairways. Check any carpeting to make sure that it is firmly attached to the stairs. Fix any carpet that is loose or worn. Avoid having  throw rugs at the top or bottom of the stairs. If you do have throw rugs, attach them to the floor with carpet tape. Make sure that you have a light switch at the top of the stairs and the bottom of the stairs. If you do not have them, ask someone to add them for you. What else can I do to help prevent falls? Wear shoes that: Do not have high heels. Have rubber bottoms. Are comfortable and fit you well. Are closed at the toe. Do not wear sandals. If you use a stepladder: Make sure that it is fully opened. Do not climb a closed stepladder. Make sure that both sides of the stepladder are locked into place. Ask someone to hold it for you, if possible. Clearly  mark and make sure that you can see: Any grab bars or handrails. First and last steps. Where the edge of each step is. Use tools that help you move around (mobility aids) if they are needed. These include: Canes. Walkers. Scooters. Crutches. Turn on the lights when you go into a dark area. Replace any light bulbs as soon as they burn out. Set up your furniture so you have a clear path. Avoid moving your furniture around. If any of your floors are uneven, fix them. If there are any pets around you, be aware of where they are. Review your medicines with your doctor. Some medicines can make you feel dizzy. This can increase your chance of falling. Ask your doctor what other things that you can do to help prevent falls. This information is not intended to replace advice given to you by your health care provider. Make sure you discuss any questions you have with your health care provider. Document Released: 01/23/2009 Document Revised: 09/04/2015 Document Reviewed: 05/03/2014 Elsevier Interactive Patient Education  2017 [employer].

## 2020-12-17 ENCOUNTER — Other Ambulatory Visit: Payer: Self-pay

## 2020-12-17 ENCOUNTER — Ambulatory Visit
Admission: RE | Admit: 2020-12-17 | Discharge: 2020-12-17 | Disposition: A | Discharge status: Home / Self Care | Payer: Medicare Other | Source: Ambulatory Visit | Attending: Surgical | Admitting: Surgical

## 2020-12-17 DIAGNOSIS — G8929 Other chronic pain: Secondary | ICD-10-CM | POA: Diagnosis not present

## 2020-12-17 DIAGNOSIS — M25511 Pain in right shoulder: Secondary | ICD-10-CM | POA: Diagnosis not present

## 2020-12-17 DIAGNOSIS — S46011A Strain of muscle(s) and tendon(s) of the rotator cuff of right shoulder, initial encounter: Secondary | ICD-10-CM | POA: Diagnosis not present

## 2020-12-17 MED ORDER — IOPAMIDOL (ISOVUE-M 200) INJECTION 41%
13.0000 mL | Freq: Once | INTRAMUSCULAR | Status: AC
  Administered 2020-12-17: 13 mL via INTRA_ARTICULAR

## 2020-12-27 ENCOUNTER — Encounter: Payer: Self-pay | Admitting: Orthopedic Surgery

## 2020-12-27 NOTE — Progress Notes (Signed)
Needs follow-up appointment with Dr dean or me relatively soon

## 2020-12-29 NOTE — Progress Notes (Signed)
Thank you :)

## 2020-12-31 ENCOUNTER — Other Ambulatory Visit: Payer: Self-pay

## 2020-12-31 ENCOUNTER — Ambulatory Visit: Payer: Medicare Other | Admitting: Orthopedic Surgery

## 2020-12-31 ENCOUNTER — Encounter: Payer: Self-pay | Admitting: Orthopedic Surgery

## 2020-12-31 DIAGNOSIS — M75121 Complete rotator cuff tear or rupture of right shoulder, not specified as traumatic: Secondary | ICD-10-CM

## 2020-12-31 NOTE — Progress Notes (Signed)
Office Visit Note   Patient: Gabriel [name]           Date of Birth: 1950-07-02           MRN: [id number] Visit Date: 12/31/2020 Requested by: Mosie Lukes, MD [address] [place],  [place] [address] PCP: Mosie Lukes, MD  Subjective: Chief Complaint  Patient presents with   Right Shoulder - Follow-up    Scan review   Other    Scan review    HPI: Gabriel [name] is a 70 year old patient with right shoulder pain.  Here to review scan of his shoulder.  MRI scan shows retracted supraspinatus tear to the top of the humeral head.  Split tear of the biceps tendon is present as well.  This is keeping him from playing golf and it is also interfering with his sleep.  Took 2 Aleve last night which helped.  The pain does keep him awake.  His pain complaints are greater than his weakness complaints.  Likes to golf 1 day a week.  No known injury to the shoulder.              ROS: All systems reviewed are negative as they relate to the chief complaint within the history of present illness.  Patient denies  fevers or chills.   Assessment & Plan: Visit Diagnoses:  1. Nontraumatic complete tear of right rotator cuff     Plan: Impression is retracted rotator cuff tear in a 70 year old patient who remains relatively active his complaints now are more pain and weakness.  Pain does keep him awake at times.  Rotator cuff tear looks repairable at this time even though it is retracted.  Fairly minimal atrophy of the muscle which is also encouraging.  Discussed with [name] operative and nonoperative treatment options.  Operative treatment option I think would give him best chance at having some pain relief as this problem on its own will likely not improve significantly even with therapy to improve his force couple strengthening.  Plan at this time is arthroscopy with biceps tendon release and rotator cuff tear and biceps tenodesis.  Risk and benefits are discussed including not limited to infection  nerve vessel damage incomplete pain relief as well as shoulder stiffness.  Anticipate 4 months out from golf.  All questions answered  Follow-Up Instructions: No follow-ups on file.   Orders:  No orders of the defined types were placed in this encounter.  No orders of the defined types were placed in this encounter.     Procedures: No procedures performed   Clinical Data: No additional findings.  Objective: Vital Signs: There were no vitals taken for this visit.  Physical Exam:   Constitutional: Patient appears well-developed HEENT:  Head: Normocephalic Eyes:EOM are normal Neck: Normal range of motion Cardiovascular: Normal rate Pulmonary/chest: Effort normal Neurologic: Patient is alert Skin: Skin is warm Psychiatric: Patient has normal mood and affect   Ortho Exam: Ortho exam demonstrates good cervical spine range of motion.  Some weakness to external rotation on the right compared to the left 4 out of 5 compared to 5 out of 5.  Not too much coarse grinding or crepitus with internal or external rotation of the right arm.  Subscap strength is intact.  O'Brien's testing is equivocal.  No other masses lymphadenopathy or skin changes noted in that shoulder girdle region.  No AC joint tenderness.  Specialty Comments:  No specialty comments available.  Imaging: No results found.  PMFS History: Patient Active Problem List   Diagnosis Date Noted   Erectile dysfunction 08/30/2020   Acute pansinusitis 02/27/2018   Unilateral primary osteoarthritis, right hip 05/03/2017   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   Tinnitus 10/12/2016   Unilateral primary osteoarthritis, left hip 03/16/2016   Status post left hip replacement 03/16/2016   Skin lesion of left leg 10/07/2015   Otitis, externa, infective 03/31/2015   Right hip pain 08/11/2014   Colon polyp 08/11/2014   Insomnia 01/09/2014   Hyperglycemia 01/09/2014   Eczema 01/09/2014   Enlarged prostate  01/30/2013   Esophageal stricture 01/30/2013   Esophageal ulcer 01/30/2013   Overweight 11/14/2012   Esophageal reflux 05/21/2012   Hyperlipidemia 01/09/2012   Preventative health care 09/29/2011   Epicondylitis elbow, medial 04/20/2011   HTN (hypertension) 03/27/2011   Achalasia 03/27/2011   Past Medical History:  Diagnosis Date   Achalasia    Arthritis    Colon cancer screening 08/11/2014   Sees [facility], Dr Carlean Purl    Colon polyp 08/11/2014   Sees [facility], Dr Carlean Purl     Eczema 01/09/2014   Esophageal reflux 05/21/2012   Hyperlipidemia    Hypertension    Insomnia 01/09/2014   Left hip pain 08/11/2014   OA (osteoarthritis) of hip    right   Otitis, externa, infective 03/31/2015   Overweight 11/14/2012   Right hip pain 08/11/2014   Sees chiropractor, Dr Clovis Riley in [place]    Skin lesion of left leg 10/07/2015   Tinnitus 10/12/2016   Ulcer    esophageal ulcer hx   Wears contact lenses     Family History  Problem Relation Age of Onset   Alcohol abuse Mother    Ovarian cancer Mother    Diabetes Maternal Grandmother    Alcohol abuse Father    Lung cancer Father    Mental illness Sister    Depression Sister    Heart disease Brother        congenital heart   Arthritis Sister    Hypertension Sister    Diverticulosis Sister    Healthy Sister    Healthy Sister    Healthy Sister    Colon cancer Neg Hx    Esophageal cancer Neg Hx    Rectal cancer Neg Hx    Stomach cancer Neg Hx     Past Surgical History:  Procedure Laterality Date   COLONOSCOPY     ESOPHAGOGASTRODUODENOSCOPY     HELLER MYOTOMY  1999   help with swallowing   TONSILLECTOMY     TONSILLECTOMY AND ADENOIDECTOMY  1963   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   Procedure: LEFT TOTAL HIP ARTHROPLASTY ANTERIOR APPROACH;  Surgeon: Mcarthur Rossetti, MD;  Location: [facility];  Service: Orthopedics;  Laterality: Left;   TOTAL HIP ARTHROPLASTY Right 05/03/2017   Procedure: RIGHT TOTAL HIP ARTHROPLASTY ANTERIOR APPROACH;  Surgeon:  Mcarthur Rossetti, MD;  Location: [facility];  Service: Orthopedics;  Laterality: Right;   UPPER GASTROINTESTINAL ENDOSCOPY     Social History   Occupational History   Occupation: sales/distribution    Employer: [employer]  Tobacco Use   Smoking status: Former    Types: Cigarettes    Quit date: 04/13/1991    Years since quitting: 29.7   Smokeless tobacco: Never   Tobacco comments:    1 ppd for 25 years quit 51'  Vaping Use   Vaping Use: Never used  Substance and Sexual Activity   Alcohol use: Yes  Alcohol/week: 7.0 standard drinks    Types: 7 Glasses of wine per week    Comment: Wine nightly   Drug use: No   Sexual activity: Not on file

## 2021-01-02 DIAGNOSIS — E785 Hyperlipidemia, unspecified: Secondary | ICD-10-CM | POA: Diagnosis not present

## 2021-01-02 DIAGNOSIS — Z79899 Other long term (current) drug therapy: Secondary | ICD-10-CM | POA: Diagnosis not present

## 2021-01-02 DIAGNOSIS — K222 Esophageal obstruction: Secondary | ICD-10-CM | POA: Diagnosis not present

## 2021-01-02 DIAGNOSIS — Z91048 Other nonmedicinal substance allergy status: Secondary | ICD-10-CM | POA: Diagnosis not present

## 2021-01-02 DIAGNOSIS — K200 Eosinophilic esophagitis: Secondary | ICD-10-CM | POA: Diagnosis not present

## 2021-01-02 DIAGNOSIS — I10 Essential (primary) hypertension: Secondary | ICD-10-CM | POA: Diagnosis not present

## 2021-01-02 DIAGNOSIS — K219 Gastro-esophageal reflux disease without esophagitis: Secondary | ICD-10-CM | POA: Diagnosis not present

## 2021-01-02 DIAGNOSIS — K220 Achalasia of cardia: Secondary | ICD-10-CM | POA: Diagnosis not present

## 2021-01-02 DIAGNOSIS — Z9889 Other specified postprocedural states: Secondary | ICD-10-CM | POA: Diagnosis not present

## 2021-01-04 ENCOUNTER — Encounter: Payer: Self-pay | Admitting: Family Medicine

## 2021-01-05 ENCOUNTER — Telehealth (INDEPENDENT_AMBULATORY_CARE_PROVIDER_SITE_OTHER): Payer: Medicare Other | Admitting: Family Medicine

## 2021-01-05 DIAGNOSIS — I10 Essential (primary) hypertension: Secondary | ICD-10-CM | POA: Diagnosis not present

## 2021-01-05 DIAGNOSIS — U071 COVID-19: Secondary | ICD-10-CM

## 2021-01-05 DIAGNOSIS — R739 Hyperglycemia, unspecified: Secondary | ICD-10-CM | POA: Diagnosis not present

## 2021-01-05 MED ORDER — MOLNUPIRAVIR EUA 200MG CAPSULE: 4.0000 | ORAL_CAPSULE | Freq: Two times a day (BID) | ORAL | 0 refills | Status: AC

## 2021-01-05 NOTE — Assessment & Plan Note (Addendum)
Monitor and report any concerns,  no changes to meds. Encouraged heart healthy diet such as the DASH diet and exercise as tolerated. Typical home Blood pressure number is 130/85

## 2021-01-05 NOTE — Telephone Encounter (Signed)
Patient scheduled with Charlett Blake today at 1pm for VV.

## 2021-01-05 NOTE — Assessment & Plan Note (Signed)
hgba1c acceptable, minimize simple carbs. Increase exercise as tolerated.  

## 2021-01-05 NOTE — Progress Notes (Signed)
MyChart Video Visit    Virtual Visit via Video Note   This visit type was conducted due to national recommendations for restrictions regarding the COVID-19 Pandemic (e.g. social distancing) in an effort to limit this patient's exposure and mitigate transmission in our community. This patient is at least at moderate risk for complications without adequate follow up. This format is felt to be most appropriate for this patient at this time. Physical exam was limited by quality of the video and audio technology used for the visit. [name] was able to get the patient set up on a video visit.  Patient location: Home Patient and provider in visit Provider location: Office  I discussed the limitations of evaluation and management by telemedicine and the availability of in person appointments. The patient expressed understanding and agreed to proceed.  Visit Date: 01/05/2021  Today's healthcare provider: Penni Homans, MD      Subjective:    Patient ID: Gabriel [name], male    DOB: Nov 29, 1950, 70 y.o.   MRN: [id number]  Chief Complaint  Patient presents with   Covid Positive    HPI Patient is in today for a video visit.  He is Covid-19 positive and his symptoms started on 09/23//2022. Sinus pressure and congestion started on Friday and after being exposed at work, the test was positive on Sunday. He is also experiencing rhinorrhea, fever, sore throat, myalgias and a slight loss of appetite. He denies chills, chest pains, diarrhea, vomiting and ear pains. He has been managing the cough with robitussin and is doing well on it.  Stats:  Oxygen - 96 Blood pressure - 148/85 Pulse -90 Temperature -97.8  On a normal day,his blood pressure is around 130/85.   Past Medical History:  Diagnosis Date   Achalasia    Arthritis    Colon cancer screening 08/11/2014   Sees [facility], Dr Carlean Purl    Colon polyp 08/11/2014   Sees [facility], Dr Carlean Purl     Eczema 01/09/2014   Esophageal reflux 05/21/2012    Hyperlipidemia    Hypertension    Insomnia 01/09/2014   Left hip pain 08/11/2014   OA (osteoarthritis) of hip    right   Otitis, externa, infective 03/31/2015   Overweight 11/14/2012   Right hip pain 08/11/2014   Sees chiropractor, Dr Clovis Riley in [place]    Skin lesion of left leg 10/07/2015   Tinnitus 10/12/2016   Ulcer    esophageal ulcer hx   Wears contact lenses     Past Surgical History:  Procedure Laterality Date   COLONOSCOPY     ESOPHAGOGASTRODUODENOSCOPY     HELLER MYOTOMY  1999   help with swallowing   TONSILLECTOMY     TONSILLECTOMY AND ADENOIDECTOMY  1963   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   Procedure: LEFT TOTAL HIP ARTHROPLASTY ANTERIOR APPROACH;  Surgeon: Mcarthur Rossetti, MD;  Location: [facility];  Service: Orthopedics;  Laterality: Left;   TOTAL HIP ARTHROPLASTY Right 05/03/2017   Procedure: RIGHT TOTAL HIP ARTHROPLASTY ANTERIOR APPROACH;  Surgeon: Mcarthur Rossetti, MD;  Location: [facility];  Service: Orthopedics;  Laterality: Right;   UPPER GASTROINTESTINAL ENDOSCOPY      Family History  Problem Relation Age of Onset   Alcohol abuse Mother    Ovarian cancer Mother    Diabetes Maternal Grandmother    Alcohol abuse Father    Lung cancer Father    Mental illness Sister    Depression Sister    Heart disease Brother  congenital heart   Arthritis Sister    Hypertension Sister    Diverticulosis Sister    Healthy Sister    Healthy Sister    Healthy Sister    Colon cancer Neg Hx    Esophageal cancer Neg Hx    Rectal cancer Neg Hx    Stomach cancer Neg Hx     Social History   Socioeconomic History   Marital status: Married    Spouse name: Not on file   Number of children: 2   Years of education: Not on file   Highest education level: Not on file  Occupational History   Occupation: sales/distribution    Employer: [employer]  Tobacco Use   Smoking status: Former    Types: Cigarettes    Quit date: 04/13/1991    Years  since quitting: 29.7   Smokeless tobacco: Never   Tobacco comments:    1 ppd for 25 years quit 93'  Vaping Use   Vaping Use: Never used  Substance and Sexual Activity   Alcohol use: Yes    Alcohol/week: 7.0 standard drinks    Types: 7 Glasses of wine per week    Comment: Wine nightly   Drug use: No   Sexual activity: Not on file  Other Topics Concern   Not on file  Social History Narrative   Married 2 daughters   Scientific laboratory technician for [employer]         Social Determinants of Health   Financial Resource Strain: Low Risk    Difficulty of Paying Living Expenses: Not hard at all  Food Insecurity: No Food Insecurity   Worried About Charity fundraiser in the Last Year: Never true   Arboriculturist in the Last Year: Never true  Transportation Needs: No Transportation Needs   Lack of Transportation (Medical): No   Lack of Transportation (Non-Medical): No  Physical Activity: Inactive   Days of Exercise per Week: 0 days   Minutes of Exercise per Session: 0 min  Stress: No Stress Concern Present   Feeling of Stress : Not at all  Social Connections: Socially Integrated   Frequency of Communication with Friends and Family: More than three times a week   Frequency of Social Gatherings with Friends and Family: More than three times a week   Attends Religious Services: More than 4 times per year   Active Member of [employer] or Organizations: Yes   Attends Music therapist: More than 4 times per year   Marital Status: Married  Human resources officer Violence: Not At Risk   Fear of Current or Ex-Partner: No   Emotionally Abused: No   Physically Abused: No   Sexually Abused: No    Outpatient Medications Prior to Visit  Medication Sig Dispense Refill   enalapril (VASOTEC) 20 MG tablet Take 1 tablet (20 mg total) by mouth daily. 90 tablet 3   hydrochlorothiazide (HYDRODIURIL) 12.5 MG tablet Take 1 tablet (12.5 mg total) by mouth daily. 90 tablet 2   Multiple  Vitamins-Minerals (ANTIOXIDANT VITAMINS) TABS Take 1 tablet by mouth daily. Life Guard     mupirocin ointment (BACTROBAN) 2 % Apply to b/l nares qhs x 7 days then as needed 22 g 1   omeprazole (PRILOSEC) 40 MG capsule TAKE 1 CAPSULE (40 MG TOTAL) BY MOUTH DAILY. 90 capsule 1   pimecrolimus (ELIDEL) 1 % cream Apply 1 application topically daily. (Patient taking differently: Apply 1 application topically 3 (three)  times daily as needed (rash).) 60 g 5   pyridostigmine (MESTINON) 60 MG/5ML solution Take 2.5 mLs by mouth 3 (three) times daily.  12   Red Yeast Rice Extract 600 MG TABS Take 1 tablet by mouth daily.      sildenafil (REVATIO) 20 MG tablet Take 1-5 tablets (20-100 mg total) by mouth daily as needed. 30 tablet 2   No facility-administered medications prior to visit.    No Known Allergies  Review of Systems  Constitutional:  Positive for fever. Negative for chills.       (+) loss of appetite    HENT:  Positive for congestion, sinus pain and sore throat. Negative for ear pain.        (+) rhinorrhea  Respiratory:  Positive for cough. Negative for wheezing.   Cardiovascular:  Negative for chest pain.  Gastrointestinal:  Negative for diarrhea and vomiting.  Musculoskeletal:  Positive for myalgias.      Objective:    Physical Exam Constitutional:      Appearance: Normal appearance.  Neurological:     Mental Status: He is alert and oriented to person, place, and time.  Psychiatric:        Behavior: Behavior normal.        Judgment: Judgment normal.    BP 130/85   Pulse 90   Temp 97.8 F (36.6 C)   SpO2 96%  Wt Readings from Last 3 Encounters:  12/08/20 205 lb (93 kg)  08/28/20 205 lb 9.6 oz (93.3 kg)  03/04/20 204 lb (92.5 kg)    Diabetic Foot Exam - Simple   No data filed    Lab Results  Component Value Date   WBC 8.0 08/28/2020   HGB 15.3 08/28/2020   HCT 44.3 08/28/2020   PLT 266.0 08/28/2020   GLUCOSE 108 (H) 08/28/2020   CHOL 191 08/28/2020   TRIG 86.0  08/28/2020   HDL 57.70 08/28/2020   LDLCALC 116 (H) 08/28/2020   ALT 38 08/28/2020   AST 25 08/28/2020   NA 130 (L) 08/28/2020   K 4.5 08/28/2020   CL 93 (L) 08/28/2020   CREATININE 0.98 08/28/2020   BUN 11 08/28/2020   CO2 29 08/28/2020   TSH 0.80 08/28/2020   PSA 3.68 03/04/2020   HGBA1C 6.2 08/28/2020   MICROALBUR <0.7 12/19/2017    Lab Results  Component Value Date   TSH 0.80 08/28/2020   Lab Results  Component Value Date   WBC 8.0 08/28/2020   HGB 15.3 08/28/2020   HCT 44.3 08/28/2020   MCV 92.6 08/28/2020   PLT 266.0 08/28/2020   Lab Results  Component Value Date   NA 130 (L) 08/28/2020   K 4.5 08/28/2020   CO2 29 08/28/2020   GLUCOSE 108 (H) 08/28/2020   BUN 11 08/28/2020   CREATININE 0.98 08/28/2020   BILITOT 0.7 08/28/2020   ALKPHOS 48 08/28/2020   AST 25 08/28/2020   ALT 38 08/28/2020   PROT 7.0 08/28/2020   ALBUMIN 4.6 08/28/2020   CALCIUM 9.6 08/28/2020   ANIONGAP 10 05/04/2017   GFR 79.14 08/28/2020   Lab Results  Component Value Date   CHOL 191 08/28/2020   Lab Results  Component Value Date   HDL 57.70 08/28/2020   Lab Results  Component Value Date   LDLCALC 116 (H) 08/28/2020   Lab Results  Component Value Date   TRIG 86.0 08/28/2020   Lab Results  Component Value Date   CHOLHDL 3 08/28/2020   Lab Results  Component Value Date   HGBA1C 6.2 08/28/2020       Assessment & Plan:   Problem List Items Addressed This Visit     HTN (hypertension)    Monitor and report any concerns,  no changes to meds. Encouraged heart healthy diet such as the DASH diet and exercise as tolerated. Typical home Blood pressure number is 130/85      Hyperglycemia    hgba1c acceptable, minimize simple carbs. Increase exercise as tolerated.       COVID-19    Was first symptomatic on Friday 4 days ago. Tested negative for 2 days then positive yesterday. Has noted cough but that has improved some. Has myalgias, fevers, congestion and agrees to a  course of molnupiravir due to his risk factors. Is sent to his pharmacy. Omron Blood Pressure cuff, upper arm, want BP 100-140/60-90 Pulse oximeter, want oxygen in 90s         No orders of the defined types were placed in this encounter.   I discussed the assessment and treatment plan with the patient. The patient was provided an opportunity to ask questions and all were answered. The patient agreed with the plan and demonstrated an understanding of the instructions.   The patient was advised to call back or seek an in-person evaluation if the symptoms worsen or if the condition fails to improve as anticipated.  I provided 20 minutes of face-to-face time during this encounter.   I,Zite Okoli,acting as a Education administrator for Penni Homans, MD.,have documented all relevant documentation on the behalf of Penni Homans, MD,as directed by  Penni Homans, MD while in the presence of Penni Homans, MD.   I, Mosie Lukes, MD personally performed the services described in this documentation. All medical record entries made by the scribe were at my direction and in my presence. I have reviewed the chart and agree that the record reflects my personal performance and is accurate and complete     Penni Homans, MD [facility] (phone) [phone] (fax)  [place]

## 2021-01-05 NOTE — Assessment & Plan Note (Addendum)
Was first symptomatic on Friday 4 days ago. Tested negative for 2 days then positive yesterday. Has noted cough but that has improved some. Has myalgias, fevers, congestion and agrees to a course of molnupiravir due to his risk factors. Is sent to his pharmacy. Omron Blood Pressure cuff, upper arm, want BP 100-140/60-90 Pulse oximeter, want oxygen in 90s

## 2021-02-09 ENCOUNTER — Other Ambulatory Visit: Payer: Self-pay | Admitting: Surgical

## 2021-02-09 ENCOUNTER — Encounter: Payer: Self-pay | Admitting: Orthopedic Surgery

## 2021-02-09 DIAGNOSIS — S43431A Superior glenoid labrum lesion of right shoulder, initial encounter: Secondary | ICD-10-CM | POA: Diagnosis not present

## 2021-02-09 DIAGNOSIS — G8918 Other acute postprocedural pain: Secondary | ICD-10-CM | POA: Diagnosis not present

## 2021-02-09 DIAGNOSIS — M7521 Bicipital tendinitis, right shoulder: Secondary | ICD-10-CM | POA: Diagnosis not present

## 2021-02-09 DIAGNOSIS — M75121 Complete rotator cuff tear or rupture of right shoulder, not specified as traumatic: Secondary | ICD-10-CM | POA: Diagnosis not present

## 2021-02-09 DIAGNOSIS — M94211 Chondromalacia, right shoulder: Secondary | ICD-10-CM | POA: Diagnosis not present

## 2021-02-09 MED ORDER — METHOCARBAMOL 500 MG PO TABS: 500.0000 mg | ORAL_TABLET | Freq: Three times a day (TID) | ORAL | 0 refills | Status: DC | PRN

## 2021-02-09 MED ORDER — OXYCODONE-ACETAMINOPHEN 5-325 MG PO TABS: 1.0000 | ORAL_TABLET | ORAL | 0 refills | Status: DC | PRN

## 2021-02-09 MED ORDER — CELECOXIB 100 MG PO CAPS: 100.0000 mg | ORAL_CAPSULE | Freq: Two times a day (BID) | ORAL | 0 refills | Status: DC

## 2021-02-11 ENCOUNTER — Telehealth: Payer: Self-pay | Admitting: Orthopedic Surgery

## 2021-02-11 NOTE — Telephone Encounter (Signed)
I talked to Gabriel [name].  He is doing reasonably well after surgery.  Having some pain but the oxycodone is making him a little sick.  He is going to try to Tylenol and  aaleveand if that does not help we may need to call him in something different.  He will let us know later this week.  He is doing well with the CPM machine.

## 2021-02-16 ENCOUNTER — Ambulatory Visit (INDEPENDENT_AMBULATORY_CARE_PROVIDER_SITE_OTHER): Payer: Medicare Other | Admitting: Surgical

## 2021-02-16 ENCOUNTER — Encounter: Payer: Self-pay | Admitting: Orthopedic Surgery

## 2021-02-16 ENCOUNTER — Other Ambulatory Visit: Payer: Self-pay

## 2021-02-16 DIAGNOSIS — M75121 Complete rotator cuff tear or rupture of right shoulder, not specified as traumatic: Secondary | ICD-10-CM

## 2021-02-16 NOTE — Progress Notes (Addendum)
Post-Op Visit Note   Patient: Gabriel [name]           Date of Birth: 1950-05-26           MRN: [id number] Visit Date: 02/16/2021 PCP: Mosie Lukes, MD   Assessment & Plan:  Chief Complaint:  Chief Complaint  Patient presents with   Right Shoulder - Post-op Follow-up   Visit Diagnoses:  1. Nontraumatic complete tear of right rotator cuff     Plan: Patient is a 70 year old male who presents s/p right shoulder rotator cuff repair 02/09/21.  He reports pain that he rates 1.5/10.  He initially had severe pain after the block wore off but now has been able to taper off of oxycodone and is only taking Aleve twice per day with Tylenol for pain control.  He is using the CPM machine religiously and is up to 80 degrees as of today.  He is staying in the sling as directed.  Not lift anything with the arm and not lifting the arm under its own power.  He does have 1 complaint of a red rash under his arm that was itchy.  This is localized to the axilla.  Patient has right shoulder range of motion with 20 degrees external rotation, 90 degrees abduction, 90 degrees forward flexion.  Incisions are healing well without any evidence of infection or dehiscence.  Sutures removed and replaced Steri-Strips.  Neurovascularly intact distally with intact EPL, wrist extension, finger abduction, finger adduction, FPL, 2+ radial pulse. Nerve intact with deltoid firing and pinprick sensation intact over the deltoid.  Plan for patient to continue with sling and continue using CPM machine.  No lifting with the operative arm.  Use hydrocortisone cream twice daily for rash under arm.  Follow-up in 2 weeks for clinical recheck with Dr. Marlou Sa.  Follow-Up Instructions: No follow-ups on file.   Orders:  No orders of the defined types were placed in this encounter.  No orders of the defined types were placed in this encounter.   Imaging: No results found.  PMFS History: Patient Active Problem List   Diagnosis  Date Noted   COVID-19 01/05/2021   Erectile dysfunction 08/30/2020   Acute pansinusitis 02/27/2018   Unilateral primary osteoarthritis, right hip 05/03/2017   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   Tinnitus 10/12/2016   Unilateral primary osteoarthritis, left hip 03/16/2016   Status post left hip replacement 03/16/2016   Skin lesion of left leg 10/07/2015   Otitis, externa, infective 03/31/2015   Right hip pain 08/11/2014   Colon polyp 08/11/2014   Insomnia 01/09/2014   Hyperglycemia 01/09/2014   Eczema 01/09/2014   Enlarged prostate 01/30/2013   Esophageal stricture 01/30/2013   Esophageal ulcer 01/30/2013   Overweight 11/14/2012   Esophageal reflux 05/21/2012   Hyperlipidemia 01/09/2012   Preventative health care 09/29/2011   Epicondylitis elbow, medial 04/20/2011   HTN (hypertension) 03/27/2011   Achalasia 03/27/2011   Past Medical History:  Diagnosis Date   Achalasia    Arthritis    Colon cancer screening 08/11/2014   Sees [facility], Dr Carlean Purl    Colon polyp 08/11/2014   Sees [facility], Dr Carlean Purl     Eczema 01/09/2014   Esophageal reflux 05/21/2012   Hyperlipidemia    Hypertension    Insomnia 01/09/2014   Left hip pain 08/11/2014   OA (osteoarthritis) of hip    right   Otitis, externa, infective 03/31/2015   Overweight 11/14/2012   Right hip pain 08/11/2014  Sees chiropractor, Dr Clovis Riley in [place]    Skin lesion of left leg 10/07/2015   Tinnitus 10/12/2016   Ulcer    esophageal ulcer hx   Wears contact lenses     Family History  Problem Relation Age of Onset   Alcohol abuse Mother    Ovarian cancer Mother    Diabetes Maternal Grandmother    Alcohol abuse Father    Lung cancer Father    Mental illness Sister    Depression Sister    Heart disease Brother        congenital heart   Arthritis Sister    Hypertension Sister    Diverticulosis Sister    Healthy Sister    Healthy Sister    Healthy Sister    Colon cancer Neg Hx    Esophageal cancer Neg Hx     Rectal cancer Neg Hx    Stomach cancer Neg Hx     Past Surgical History:  Procedure Laterality Date   COLONOSCOPY     ESOPHAGOGASTRODUODENOSCOPY     HELLER MYOTOMY  1999   help with swallowing   TONSILLECTOMY     TONSILLECTOMY AND ADENOIDECTOMY  1963   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   Procedure: LEFT TOTAL HIP ARTHROPLASTY ANTERIOR APPROACH;  Surgeon: Mcarthur Rossetti, MD;  Location: [facility];  Service: Orthopedics;  Laterality: Left;   TOTAL HIP ARTHROPLASTY Right 05/03/2017   Procedure: RIGHT TOTAL HIP ARTHROPLASTY ANTERIOR APPROACH;  Surgeon: Mcarthur Rossetti, MD;  Location: [facility];  Service: Orthopedics;  Laterality: Right;   UPPER GASTROINTESTINAL ENDOSCOPY     Social History   Occupational History   Occupation: sales/distribution    Employer: [employer]  Tobacco Use   Smoking status: Former    Types: Cigarettes    Quit date: 04/13/1991    Years since quitting: 29.8   Smokeless tobacco: Never   Tobacco comments:    1 ppd for 25 years quit 93'  Vaping Use   Vaping Use: Never used  Substance and Sexual Activity   Alcohol use: Yes    Alcohol/week: 7.0 standard drinks    Types: 7 Glasses of wine per week    Comment: Wine nightly   Drug use: No   Sexual activity: Not on file

## 2021-02-17 ENCOUNTER — Encounter: Payer: Self-pay | Admitting: Orthopedic Surgery

## 2021-02-17 NOTE — Telephone Encounter (Signed)
Tell him Catalina [place]igua says it's okay to go past 90 degrees.  We can give him pictures at next appointment in 2 weeks

## 2021-03-01 ENCOUNTER — Encounter: Payer: Self-pay | Admitting: Family Medicine

## 2021-03-02 ENCOUNTER — Other Ambulatory Visit: Payer: Self-pay

## 2021-03-02 DIAGNOSIS — R040 Epistaxis: Secondary | ICD-10-CM

## 2021-03-02 MED ORDER — MUPIROCIN 2 % EX OINT: TOPICAL_OINTMENT | CUTANEOUS | 1 refills | Status: AC

## 2021-03-04 ENCOUNTER — Ambulatory Visit (INDEPENDENT_AMBULATORY_CARE_PROVIDER_SITE_OTHER): Payer: Medicare Other | Admitting: Orthopedic Surgery

## 2021-03-04 ENCOUNTER — Other Ambulatory Visit: Payer: Self-pay

## 2021-03-04 ENCOUNTER — Encounter: Payer: Self-pay | Admitting: Orthopedic Surgery

## 2021-03-04 DIAGNOSIS — M25511 Pain in right shoulder: Secondary | ICD-10-CM

## 2021-03-04 MED ORDER — TRAMADOL HCL 50 MG PO TABS: ORAL_TABLET | ORAL | 0 refills | Status: DC

## 2021-03-04 NOTE — Progress Notes (Signed)
Post-Op Visit Note   Patient: Gabriel [name]           Date of Birth: April 10, 1951           MRN: [id number] Visit Date: 03/04/2021 PCP: Mosie Lukes, MD   Assessment & Plan:  Chief Complaint:  Chief Complaint  Patient presents with   Right Shoulder - Routine Post Op     right shoulder rotator cuff repair 02/09/21   Visit Diagnoses:  1. Right shoulder pain, unspecified chronicity     Plan: Gabriel [name] is a 70 year old patient who underwent right shoulder rotator cuff repair 02/09/2021.  101 on CPM.  No formal PT yet.  Taking Aleve and Tylenol for pain.  Has CPM 1 more week.  On exam he has very good rotator cuff strength and good passive range of motion.  Passive range of motion today is 30/70/100.  Plan is therapy here 1 time a week for 3 weeks for deltoid isometrics and passive range of motion.  Next 3 weeks after that okay for strengthening 2 times a week for 4 weeks.  Tramadol prescribed as well.  4-week return for clinical recheck.  Follow-Up Instructions: Return in about 4 weeks (around 04/01/2021).   Orders:  Orders Placed This Encounter  Procedures   Ambulatory referral to Physical Therapy   Meds ordered this encounter  Medications   traMADol (ULTRAM) 50 MG tablet    Sig: 1 po q 8 hrs prn pain    Dispense:  30 tablet    Refill:  0    Imaging: No results found.  PMFS History: Patient Active Problem List   Diagnosis Date Noted   COVID-19 01/05/2021   Erectile dysfunction 08/30/2020   Acute pansinusitis 02/27/2018   Unilateral primary osteoarthritis, right hip 05/03/2017   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   Tinnitus 10/12/2016   Unilateral primary osteoarthritis, left hip 03/16/2016   Status post left hip replacement 03/16/2016   Skin lesion of left leg 10/07/2015   Otitis, externa, infective 03/31/2015   Right hip pain 08/11/2014   Colon polyp 08/11/2014   Insomnia 01/09/2014   Hyperglycemia 01/09/2014   Eczema 01/09/2014   Enlarged  prostate 01/30/2013   Esophageal stricture 01/30/2013   Esophageal ulcer 01/30/2013   Overweight 11/14/2012   Esophageal reflux 05/21/2012   Hyperlipidemia 01/09/2012   Preventative health care 09/29/2011   Epicondylitis elbow, medial 04/20/2011   HTN (hypertension) 03/27/2011   Achalasia 03/27/2011   Past Medical History:  Diagnosis Date   Achalasia    Arthritis    Colon cancer screening 08/11/2014   Sees [facility], Dr Carlean Purl    Colon polyp 08/11/2014   Sees [facility], Dr Carlean Purl     Eczema 01/09/2014   Esophageal reflux 05/21/2012   Hyperlipidemia    Hypertension    Insomnia 01/09/2014   Left hip pain 08/11/2014   OA (osteoarthritis) of hip    right   Otitis, externa, infective 03/31/2015   Overweight 11/14/2012   Right hip pain 08/11/2014   Sees chiropractor, Dr Clovis Riley in [place]    Skin lesion of left leg 10/07/2015   Tinnitus 10/12/2016   Ulcer    esophageal ulcer hx   Wears contact lenses     Family History  Problem Relation Age of Onset   Alcohol abuse Mother    Ovarian cancer Mother    Diabetes Maternal Grandmother    Alcohol abuse Father    Lung cancer Father    Mental illness Sister  Depression Sister    Heart disease Brother        congenital heart   Arthritis Sister    Hypertension Sister    Diverticulosis Sister    Healthy Sister    Healthy Sister    Healthy Sister    Colon cancer Neg Hx    Esophageal cancer Neg Hx    Rectal cancer Neg Hx    Stomach cancer Neg Hx     Past Surgical History:  Procedure Laterality Date   COLONOSCOPY     ESOPHAGOGASTRODUODENOSCOPY     HELLER MYOTOMY  1999   help with swallowing   TONSILLECTOMY     TONSILLECTOMY AND ADENOIDECTOMY  1963   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   Procedure: LEFT TOTAL HIP ARTHROPLASTY ANTERIOR APPROACH;  Surgeon: Mcarthur Rossetti, MD;  Location: [place];  Service: Orthopedics;  Laterality: Left;   TOTAL HIP ARTHROPLASTY Right 05/03/2017   Procedure: RIGHT TOTAL HIP ARTHROPLASTY ANTERIOR APPROACH;   Surgeon: Mcarthur Rossetti, MD;  Location: [place];  Service: Orthopedics;  Laterality: Right;   UPPER GASTROINTESTINAL ENDOSCOPY     Social History   Occupational History   Occupation: sales/distribution    Employer: [employer]  Tobacco Use   Smoking status: Former    Types: Cigarettes    Quit date: 04/13/1991    Years since quitting: 29.9   Smokeless tobacco: Never   Tobacco comments:    1 ppd for 25 years quit 93'  Vaping Use   Vaping Use: Never used  Substance and Sexual Activity   Alcohol use: Yes    Alcohol/week: 7.0 standard drinks    Types: 7 Glasses of wine per week    Comment: Wine nightly   Drug use: No   Sexual activity: Not on file

## 2021-03-10 ENCOUNTER — Other Ambulatory Visit: Payer: Self-pay | Admitting: Family Medicine

## 2021-03-13 ENCOUNTER — Encounter: Payer: Self-pay | Admitting: Orthopedic Surgery

## 2021-03-20 DIAGNOSIS — D2371 Other benign neoplasm of skin of right lower limb, including hip: Secondary | ICD-10-CM | POA: Diagnosis not present

## 2021-03-20 DIAGNOSIS — D2261 Melanocytic nevi of right upper limb, including shoulder: Secondary | ICD-10-CM | POA: Diagnosis not present

## 2021-03-20 DIAGNOSIS — Z85828 Personal history of other malignant neoplasm of skin: Secondary | ICD-10-CM | POA: Diagnosis not present

## 2021-03-20 DIAGNOSIS — L821 Other seborrheic keratosis: Secondary | ICD-10-CM | POA: Diagnosis not present

## 2021-03-20 DIAGNOSIS — D225 Melanocytic nevi of trunk: Secondary | ICD-10-CM | POA: Diagnosis not present

## 2021-03-20 DIAGNOSIS — D1801 Hemangioma of skin and subcutaneous tissue: Secondary | ICD-10-CM | POA: Diagnosis not present

## 2021-03-20 DIAGNOSIS — D485 Neoplasm of uncertain behavior of skin: Secondary | ICD-10-CM | POA: Diagnosis not present

## 2021-03-25 ENCOUNTER — Encounter: Payer: Self-pay | Admitting: Physical Therapy

## 2021-03-25 ENCOUNTER — Other Ambulatory Visit: Payer: Self-pay

## 2021-03-25 ENCOUNTER — Ambulatory Visit: Payer: Medicare Other | Admitting: Physical Therapy

## 2021-03-25 DIAGNOSIS — R600 Localized edema: Secondary | ICD-10-CM

## 2021-03-25 DIAGNOSIS — M25511 Pain in right shoulder: Secondary | ICD-10-CM

## 2021-03-25 DIAGNOSIS — M6281 Muscle weakness (generalized): Secondary | ICD-10-CM | POA: Diagnosis not present

## 2021-03-25 NOTE — Therapy (Signed)
[facility] Physical Therapy [address] [place], [place], [address] Phone: [phone]   Fax:  [phone]  Physical Therapy Evaluation  Patient Details  Name: Gabriel [name] MRN: [id number] Date of Birth: 06-22-1950 Referring Provider (PT): Marlou Sa, Tonna Corner, MD,   Encounter Date: 03/25/2021   PT End of Session - 03/25/21 1014     Visit Number 1    Number of Visits 15    Date for PT Re-Evaluation 05/20/21    Authorization Type UHC MCR    PT Start Time 2505    PT Stop Time 1100    PT Time Calculation (min) 45 min    Activity Tolerance Patient tolerated treatment well    Behavior During Therapy [facility] for tasks assessed/performed             Past Medical History:  Diagnosis Date   Achalasia    Arthritis    Colon cancer screening 08/11/2014   Sees [facility], Dr Carlean Purl    Colon polyp 08/11/2014   Sees [facility], Dr Carlean Purl     Eczema 01/09/2014   Esophageal reflux 05/21/2012   Hyperlipidemia    Hypertension    Insomnia 01/09/2014   Left hip pain 08/11/2014   OA (osteoarthritis) of hip    right   Otitis, externa, infective 03/31/2015   Overweight 11/14/2012   Right hip pain 08/11/2014   Sees chiropractor, Dr Clovis Riley in [place]    Skin lesion of left leg 10/07/2015   Tinnitus 10/12/2016   Ulcer    esophageal ulcer hx   Wears contact lenses     Past Surgical History:  Procedure Laterality Date   COLONOSCOPY     ESOPHAGOGASTRODUODENOSCOPY     HELLER MYOTOMY  1999   help with swallowing   TONSILLECTOMY     TONSILLECTOMY AND ADENOIDECTOMY  1963   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   Procedure: LEFT TOTAL HIP ARTHROPLASTY ANTERIOR APPROACH;  Surgeon: Mcarthur Rossetti, MD;  Location: [place];  Service: Orthopedics;  Laterality: Left;   TOTAL HIP ARTHROPLASTY Right 05/03/2017   Procedure: RIGHT TOTAL HIP ARTHROPLASTY ANTERIOR APPROACH;  Surgeon: Mcarthur Rossetti, MD;  Location: [place];  Service: Orthopedics;  Laterality: Right;   UPPER GASTROINTESTINAL  ENDOSCOPY      There were no vitals filed for this visit.    Subjective Assessment - 03/25/21 1008     Subjective Rt shoulder RTC repair 02/09/21. He said he had CPM machine which really helped. 2 days ago he was in a lot of pain after "hugging" a friend but says it has improved now    Currently in Pain? Yes    Pain Score 5     Pain Location Shoulder    Pain Orientation Right    Pain Descriptors / Indicators Aching;Burning    Pain Type Surgical pain    Pain Radiating Towards denies N/T    Pain Onset More than a month ago    Pain Frequency Intermittent    Aggravating Factors  using his arm    Pain Relieving Factors having arm supported, ice                [facility] PT Assessment - 03/25/21 0001       Assessment   Medical Diagnosis Rt shoulder RTC repair 02/09/21    Referring Provider (PT) Marlou Sa Tonna Corner, MD,      Precautions   Precaution Comments MD, recommending PT 1x/wk for 3 weeks  Deltoid isometrics+PROm+HEP. Ok for strengthening 6 weeks post op-In 3  weeks start PT 2x/wk for 4 weeks      Balance Screen   Has the patient fallen in the past 6 months No    Has the patient had a decrease in activity level because of a fear of falling?  No    Is the patient reluctant to leave their home because of a fear of falling?  No      Home Ecologist residence      Prior Function   Level of Independence Independent    Vocation Part time employment    Vocation Requirements deliveries    Leisure golf      Cognition   Overall Cognitive Status Within Functional Limits for tasks assessed      Observation/Other Assessments   Focus on Therapeutic Outcomes (FOTO)  51% functional, goal is 69%      ROM / Strength   AROM / PROM / Strength PROM;Strength      PROM   PROM Assessment Site Shoulder    Right/Left Shoulder Right    Right Shoulder Flexion 130 Degrees    Right Shoulder ABduction 125 Degrees    Right Shoulder Internal Rotation 45  Degrees    Right Shoulder External Rotation 70 Degrees      Strength   Overall Strength Comments deferred due to post op status                        Objective measurements completed on examination: See above findings.       [place] Adult PT Treatment/Exercise - 03/25/21 0001       Exercises   Exercises Shoulder      Shoulder Exercises: Supine   External Rotation AAROM      Shoulder Exercises: Seated   Other Seated Exercises supine wand ER,abd, and flexion PROM X 10 holding 3 sec ea    Other Seated Exercises supine IR stretch 5 sec X10      Shoulder Exercises: Standing   Other Standing Exercises submax isometrics 5 sec X 5 for flexion, ER,IR,abd,ext                       PT Short Term Goals - 03/25/21 1013       PT SHORT TERM GOAL #1   Title Pt will be I and compliant with HEP    Time 4    Period Weeks    Status New    Target Date 04/22/21      PT SHORT TERM GOAL #2   Title He will improve Rt shoulder PROM to [facility]    Time 4    Period Weeks    Status New               PT Long Term Goals - 03/25/21 1012       PT LONG TERM GOAL #1   Title Pt will improve FOTO functional score to 69%    Time 8    Period Weeks    Status New    Target Date 05/20/21      PT LONG TERM GOAL #2   Title Pt will improve Rt shoulder AROM to [facility] and PROM to WNL    Time 8    Period Weeks    Status New      PT LONG TERM GOAL #3   Title Pt will improve Rt shoulder strength to at least 4+/5 all planes to improve functional lifiting  Time 8    Period Weeks    Status New      PT LONG TERM GOAL #4   Title Pt will reduce pain to overall 3 or less out of 10 with usual activity.    Time 8    Period Weeks    Status New      PT LONG TERM GOAL #5   Title He will be able to return to light golf activity drills, chipping and putting activity without difficulty    Time 8    Period Weeks    Status New                    Plan - 03/25/21  1055     Clinical Impression Statement Pt presents to PT S/P Rt shoulder RTC repair 02/09/21. He is overall doing well up to this point. MD is recommending PT 1x/wk for 3 weeks for Deltoid isometrics+PROm+HEP, then [facility] for strengthening 6 weeks post op-In 3 weeks start PT 2x/wk for 4 weeks. He will benefit from skilled PT to regain his normal ROM, strength, and functonal use of his Rt UE. He has goal to return to golf.    Personal Factors and Comorbidities Comorbidity 1    Comorbidities PMH:Rt RTC repain 02/09/21, Rt THA, insomnia,    Examination-Activity Limitations Lift;Carry;Reach Overhead;Sleep    Examination-Participation Restrictions Cleaning;Driving;Yard Work;Occupation;Other   golf   Stability/Clinical Decision Making Stable/Uncomplicated    Clinical Decision Making Low    Rehab Potential Excellent    PT Frequency 2x / week   1-2   PT Duration 8 weeks   6-8   PT Treatment/Interventions ADLs/Self Care Home Management;Cryotherapy;Electrical Stimulation;Iontophoresis 4mg /ml Dexamethasone;Moist Heat;Ultrasound;Therapeutic activities;Therapeutic exercise;Neuromuscular re-education;Patient/family education;Manual techniques;Passive range of motion;Dry needling;Joint Manipulations;Vasopneumatic Device;Taping    PT Next Visit Plan PROM and deltoid isometrics next week then can progress to AROM and light RTC strength in 2 weeks    PT Home Exercise Plan Access Code: [email or web address]    Consulted and Agree with Plan of Care Patient             Patient will benefit from skilled therapeutic intervention in order to improve the following deficits and impairments:  Decreased activity tolerance, Decreased range of motion, Decreased strength, Increased edema, Impaired UE functional use, Pain  Visit Diagnosis: Acute pain of right shoulder  Muscle weakness (generalized)  Localized edema     Problem List Patient Active Problem List   Diagnosis Date Noted   COVID-19 01/05/2021   Erectile dysfunction  08/30/2020   Acute pansinusitis 02/27/2018   Unilateral primary osteoarthritis, right hip 05/03/2017   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   Tinnitus 10/12/2016   Unilateral primary osteoarthritis, left hip 03/16/2016   Status post left hip replacement 03/16/2016   Skin lesion of left leg 10/07/2015   Otitis, externa, infective 03/31/2015   Right hip pain 08/11/2014   Colon polyp 08/11/2014   Insomnia 01/09/2014   Hyperglycemia 01/09/2014   Eczema 01/09/2014   Enlarged prostate 01/30/2013   Esophageal stricture 01/30/2013   Esophageal ulcer 01/30/2013   Overweight 11/14/2012   Esophageal reflux 05/21/2012   Hyperlipidemia 01/09/2012   Preventative health care 09/29/2011   Epicondylitis elbow, medial 04/20/2011   HTN (hypertension) 03/27/2011   Achalasia 03/27/2011    Debbe Odea, PT,DPT 03/25/2021, 11:21 AM  [facility] Physical Therapy [address] [place], [place], [address] Phone: [phone]   Fax:  [phone]  Name: Gabriel [name] MRN: [id number] Date of Birth:  06/19/1950 ° ° °

## 2021-03-25 NOTE — Patient Instructions (Signed)
Access Code: [email or web address] URL: [email or web address] Date: 03/25/2021 Prepared by: Elsie Ra  Exercises Supine Shoulder Flexion Extension AAROM with Dowel - 2 x daily - 6 x weekly - 1 sets - 10-15 reps - 3-5 sec hold Supine Shoulder Abduction AAROM with Dowel - 2 x daily - 6 x weekly - 1 sets - 10-15 reps - 3-5 sec hold Supine Shoulder External Rotation in 45 Degrees Abduction AAROM with Dowel - 2 x daily - 6 x weekly - 1-2 sets - 10-15 reps - 3-5 sec hold Supine Shoulder Internal Rotation Stretch - 2 x daily - 6 x weekly - 1-2 sets - 10 reps - 3-5 sec hold Isometric Shoulder Flexion - 2 x daily - 6 x weekly - 1 sets - 10 reps - 5 hold Isometric Shoulder External Rotation - 2 x daily - 6 x weekly - 1 sets - 10 reps - 5 hold Isometric Shoulder Internal Rotation - 2 x daily - 6 x weekly - 1 sets - 10 reps - 5 hold Isometric Shoulder Abduction at Wall - 2 x daily - 6 x weekly - 1 sets - 10 reps - 5 hold Isometric Shoulder Extension at Wall - 2 x daily - 6 x weekly - 1 sets - 10 reps - 5 hold

## 2021-04-03 ENCOUNTER — Other Ambulatory Visit: Payer: Self-pay

## 2021-04-03 ENCOUNTER — Ambulatory Visit: Payer: Medicare Other | Admitting: Rehabilitative and Restorative Service Providers"

## 2021-04-03 ENCOUNTER — Encounter: Payer: Self-pay | Admitting: Rehabilitative and Restorative Service Providers"

## 2021-04-03 DIAGNOSIS — R600 Localized edema: Secondary | ICD-10-CM

## 2021-04-03 DIAGNOSIS — M25511 Pain in right shoulder: Secondary | ICD-10-CM

## 2021-04-03 DIAGNOSIS — M6281 Muscle weakness (generalized): Secondary | ICD-10-CM | POA: Diagnosis not present

## 2021-04-03 NOTE — Therapy (Signed)
[facility] Physical Therapy [address] [place], [place], [address] Phone: [phone]   Fax:  [phone]  Physical Therapy Treatment  Patient Details  Name: Gabriel [name] MRN: [id number] Date of Birth: 1950/11/01 Referring Provider (PT): Marlou Sa, Tonna Corner, MD,   Encounter Date: 04/03/2021   PT End of Session - 04/03/21 1001     Visit Number 2    Number of Visits 15    Date for PT Re-Evaluation 05/20/21    Authorization Type UHC MCR    PT Start Time 1008    PT Stop Time 0109    PT Time Calculation (min) 39 min    Activity Tolerance Patient tolerated treatment well    Behavior During Therapy [facility] for tasks assessed/performed             Past Medical History:  Diagnosis Date   Achalasia    Arthritis    Colon cancer screening 08/11/2014   Sees [facility], Dr Carlean Purl    Colon polyp 08/11/2014   Sees [facility], Dr Carlean Purl     Eczema 01/09/2014   Esophageal reflux 05/21/2012   Hyperlipidemia    Hypertension    Insomnia 01/09/2014   Left hip pain 08/11/2014   OA (osteoarthritis) of hip    right   Otitis, externa, infective 03/31/2015   Overweight 11/14/2012   Right hip pain 08/11/2014   Sees chiropractor, Dr Clovis Riley in [place]    Skin lesion of left leg 10/07/2015   Tinnitus 10/12/2016   Ulcer    esophageal ulcer hx   Wears contact lenses     Past Surgical History:  Procedure Laterality Date   COLONOSCOPY     ESOPHAGOGASTRODUODENOSCOPY     HELLER MYOTOMY  1999   help with swallowing   TONSILLECTOMY     TONSILLECTOMY AND ADENOIDECTOMY  1963   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   Procedure: LEFT TOTAL HIP ARTHROPLASTY ANTERIOR APPROACH;  Surgeon: Mcarthur Rossetti, MD;  Location: [place];  Service: Orthopedics;  Laterality: Left;   TOTAL HIP ARTHROPLASTY Right 05/03/2017   Procedure: RIGHT TOTAL HIP ARTHROPLASTY ANTERIOR APPROACH;  Surgeon: Mcarthur Rossetti, MD;  Location: [place];  Service: Orthopedics;  Laterality: Right;   UPPER GASTROINTESTINAL  ENDOSCOPY      There were no vitals filed for this visit.   Subjective Assessment - 04/03/21 1010     Subjective Pt. indicated no pain upon arrival today.  6/10 at worst c soreness with some exercise.    Currently in Pain? No/denies    Pain Score 0-No pain    Pain Onset More than a month ago                [facility] PT Assessment - 04/03/21 0001       Assessment   Medical Diagnosis Rt shoulder RTC repair 02/09/21    Referring Provider (PT) Marlou Sa Tonna Corner, MD,    Onset Date/Surgical Date 02/09/22      Precautions   Precaution Comments Per MD note on 03/04/2021, Pt was ok for strengthening at this time      PROM   Right Shoulder Internal Rotation 70 Degrees   in 45 deg abduction in supine   Right Shoulder External Rotation 45 Degrees   in 45 deg abduction in supine                          [facility] Adult PT Treatment/Exercise - 04/03/21 0001       Exercises  Exercises Other Exercises    Other Exercises  Review of existing HEP program c cues for procedures      Shoulder Exercises: Supine   External Rotation Right;10 reps   in 45 deg abduction 5 sec hold   Flexion Both   wand flexion Lt arm driven x 10   ABduction 10 reps   wand 5 sec hold     Shoulder Exercises: Standing   Other Standing Exercises submax painfree isometrics flexion, abduction, er, ir, ext 5 sec hold x 12 each (5 sec on/off)      Manual Therapy   Manual therapy comments supine g2-g3 inferior jt mobs in flexion/abduction, scaption Rt shoulder.  AP mobs Rt shoulder in ER                       PT Short Term Goals - 03/25/21 1013       PT SHORT TERM GOAL #1   Title Pt will be I and compliant with HEP    Time 4    Period Weeks    Status New    Target Date 04/22/21      PT SHORT TERM GOAL #2   Title He will improve Rt shoulder PROM to [facility]    Time 4    Period Weeks    Status New               PT Long Term Goals - 03/25/21 1012       PT LONG TERM GOAL  #1   Title Pt will improve FOTO functional score to 69%    Time 8    Period Weeks    Status New    Target Date 05/20/21      PT LONG TERM GOAL #2   Title Pt will improve Rt shoulder AROM to [facility] and PROM to WNL    Time 8    Period Weeks    Status New      PT LONG TERM GOAL #3   Title Pt will improve Rt shoulder strength to at least 4+/5 all planes to improve functional lifiting    Time 8    Period Weeks    Status New      PT LONG TERM GOAL #4   Title Pt will reduce pain to overall 3 or less out of 10 with usual activity.    Time 8    Period Weeks    Status New      PT LONG TERM GOAL #5   Title He will be able to return to light golf activity drills, chipping and putting activity without difficulty    Time 8    Period Weeks    Status New                   Plan - 04/03/21 1041     Clinical Impression Statement Pt. demonstrated good overall knowledge of HEP c some cues in techniques to improve overall performance.  Pt. to benefit from continued skilled PT services to address mobility, strength and functional deficits in movement.    Personal Factors and Comorbidities Comorbidity 1    Comorbidities PMH:Rt RTC repain 02/09/21, Rt THA, insomnia,    Examination-Activity Limitations Lift;Carry;Reach Overhead;Sleep    Examination-Participation Restrictions Cleaning;Driving;Yard Work;Occupation;Other   golf   Stability/Clinical Decision Making Stable/Uncomplicated    Rehab Potential Excellent    PT Frequency 2x / week   1-2   PT Duration 8 weeks   6-8  PT Treatment/Interventions ADLs/Self Care Home Management;Cryotherapy;Electrical Stimulation;Iontophoresis 4mg /ml Dexamethasone;Moist Heat;Ultrasound;Therapeutic activities;Therapeutic exercise;Neuromuscular re-education;Patient/family education;Manual techniques;Passive range of motion;Dry needling;Joint Manipulations;Vasopneumatic Device;Taping    PT Next Visit Plan AAROM, early AROM as tolerated    PT Home Exercise Plan  Access Code: [email or web address]    Consulted and Agree with Plan of Care Patient             Patient will benefit from skilled therapeutic intervention in order to improve the following deficits and impairments:  Decreased activity tolerance, Decreased range of motion, Decreased strength, Increased edema, Impaired UE functional use, Pain  Visit Diagnosis: Acute pain of right shoulder  Muscle weakness (generalized)  Localized edema     Problem List Patient Active Problem List   Diagnosis Date Noted   COVID-19 01/05/2021   Erectile dysfunction 08/30/2020   Acute pansinusitis 02/27/2018   Unilateral primary osteoarthritis, right hip 05/03/2017   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   Tinnitus 10/12/2016   Unilateral primary osteoarthritis, left hip 03/16/2016   Status post left hip replacement 03/16/2016   Skin lesion of left leg 10/07/2015   Otitis, externa, infective 03/31/2015   Right hip pain 08/11/2014   Colon polyp 08/11/2014   Insomnia 01/09/2014   Hyperglycemia 01/09/2014   Eczema 01/09/2014   Enlarged prostate 01/30/2013   Esophageal stricture 01/30/2013   Esophageal ulcer 01/30/2013   Overweight 11/14/2012   Esophageal reflux 05/21/2012   Hyperlipidemia 01/09/2012   Preventative health care 09/29/2011   Epicondylitis elbow, medial 04/20/2011   HTN (hypertension) 03/27/2011   Achalasia 03/27/2011    Scot Jun, PT, DPT, OCS, ATC 04/03/21  10:52 AM    [facility] [address] [place], [place], [address] Phone: [phone]   Fax:  [phone]  Name: Gabriel [name] MRN: [id number] Date of Birth: [birth year]

## 2021-04-08 ENCOUNTER — Encounter: Payer: Self-pay | Admitting: Rehabilitative and Restorative Service Providers"

## 2021-04-08 ENCOUNTER — Other Ambulatory Visit: Payer: Self-pay

## 2021-04-08 ENCOUNTER — Ambulatory Visit: Payer: Medicare Other | Admitting: Rehabilitative and Restorative Service Providers"

## 2021-04-08 DIAGNOSIS — R600 Localized edema: Secondary | ICD-10-CM | POA: Diagnosis not present

## 2021-04-08 DIAGNOSIS — M25511 Pain in right shoulder: Secondary | ICD-10-CM | POA: Diagnosis not present

## 2021-04-08 DIAGNOSIS — M6281 Muscle weakness (generalized): Secondary | ICD-10-CM

## 2021-04-08 NOTE — Patient Instructions (Signed)
Access Code: [email or web address] URL: [email or web address] Date: 04/08/2021 Prepared by: Scot Jun  Exercises Supine Shoulder External Rotation in 45 Degrees Abduction AAROM with Dowel - 2 x daily - 6 x weekly - 1-2 sets - 10-15 reps - 3-5 sec hold Supine Shoulder Internal Rotation Stretch - 2 x daily - 6 x weekly - 1-2 sets - 10 reps - 3-5 sec hold Standing Isometric Shoulder External Rotation with Doorway (Mirrored) - 2 x daily - 7 x weekly - 1 sets - 10 reps - 5 hold Standing Shoulder Row with Anchored Resistance - 2 x daily - 7 x weekly - 3 sets - 10 reps Shoulder Extension with Resistance - 2 x daily - 7 x weekly - 3 sets - 10 reps Supine Shoulder Flexion Extension Full Range AROM - 2 x daily - 7 x weekly - 3 sets - 10 reps Sidelying Shoulder Abduction Palm Forward - 2 x daily - 7 x weekly - 3 sets - 10-15 reps

## 2021-04-08 NOTE — Therapy (Signed)
[facility] [address] [place], [place], [address] Phone: [phone]   Fax:  [phone]  Physical Therapy Treatment  Patient Details  Name: Gabriel [name] MRN: [id number] Date of Birth: [birth year] Referring Provider (PT): Marlou Sa, Tonna Corner, MD,   Encounter Date: 04/08/2021   PT End of Session - 04/08/21 1421     Visit Number 3    Number of Visits 15    Date for PT Re-Evaluation 05/20/21    Authorization Type UHC MCR    PT Start Time 8144    PT Stop Time 1501    PT Time Calculation (min) 39 min    Activity Tolerance Patient tolerated treatment well    Behavior During Therapy [facility] for tasks assessed/performed             Past Medical History:  Diagnosis Date   Achalasia    Arthritis    Colon cancer screening 08/11/2014   Sees [facility], Dr Carlean Purl    Colon polyp 08/11/2014   Sees [facility], Dr Carlean Purl     Eczema 01/09/2014   Esophageal reflux 05/21/2012   Hyperlipidemia    Hypertension    Insomnia 01/09/2014   Left hip pain 08/11/2014   OA (osteoarthritis) of hip    right   Otitis, externa, infective 03/31/2015   Overweight 11/14/2012   Right hip pain 08/11/2014   Sees chiropractor, Dr Clovis Riley in [place]    Skin lesion of left leg 10/07/2015   Tinnitus 10/12/2016   Ulcer    esophageal ulcer hx   Wears contact lenses     Past Surgical History:  Procedure Laterality Date   COLONOSCOPY     ESOPHAGOGASTRODUODENOSCOPY     HELLER MYOTOMY  1999   help with swallowing   TONSILLECTOMY     TONSILLECTOMY AND ADENOIDECTOMY  1963   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   Procedure: LEFT TOTAL HIP ARTHROPLASTY ANTERIOR APPROACH;  Surgeon: Mcarthur Rossetti, MD;  Location: [place];  Service: Orthopedics;  Laterality: Left;   TOTAL HIP ARTHROPLASTY Right 05/03/2017   Procedure: RIGHT TOTAL HIP ARTHROPLASTY ANTERIOR APPROACH;  Surgeon: Mcarthur Rossetti, MD;  Location: [place];  Service: Orthopedics;  Laterality: Right;   UPPER GASTROINTESTINAL  ENDOSCOPY      There were no vitals filed for this visit.   Subjective Assessment - 04/08/21 1423     Subjective Pt. indicated using arm at work some with mouse use.  Some soreness but was getting better today.  No specific pain upon arrival.    Currently in Pain? No/denies    Pain Score 0-No pain    Pain Onset More than a month ago                               [facility] Adult PT Treatment/Exercise - 04/08/21 0001       Shoulder Exercises: Supine   Protraction AROM;Both   3 second hold x 15   Other Supine Exercises supine bilateral wand flexion x 15, Rt arm AROM flexion x 15      Shoulder Exercises: Sidelying   ABduction Right   2 x 10     Shoulder Exercises: Standing   Extension 20 reps;Both    Theraband Level (Shoulder Extension) Level 3 (Green)    Row 20 reps;Both    Theraband Level (Shoulder Row) Level 3 (Green)      Shoulder Exercises: Pulleys   Flexion 2 minutes    Scaption  2 minutes      Shoulder Exercises: ROM/Strengthening   UBE (Upper Arm Bike) Lvl 3.5 fwd/back 3 mins each way                     PT Education - 04/08/21 1445     Education Details HEP progression    Person(s) Educated Patient    Methods Explanation;Demonstration;Verbal cues;Handout    Comprehension Verbalized understanding;Returned demonstration              PT Short Term Goals - 04/08/21 1448       PT SHORT TERM GOAL #1   Title Pt will be I and compliant with HEP    Time 4    Period Weeks    Status On-going    Target Date 04/22/21      PT SHORT TERM GOAL #2   Title He will improve Rt shoulder PROM to [facility]    Time 4    Period Weeks    Status On-going               PT Long Term Goals - 03/25/21 1012       PT LONG TERM GOAL #1   Title Pt will improve FOTO functional score to 69%    Time 8    Period Weeks    Status New    Target Date 05/20/21      PT LONG TERM GOAL #2   Title Pt will improve Rt shoulder AROM to [facility] and PROM to WNL     Time 8    Period Weeks    Status New      PT LONG TERM GOAL #3   Title Pt will improve Rt shoulder strength to at least 4+/5 all planes to improve functional lifiting    Time 8    Period Weeks    Status New      PT LONG TERM GOAL #4   Title Pt will reduce pain to overall 3 or less out of 10 with usual activity.    Time 8    Period Weeks    Status New      PT LONG TERM GOAL #5   Title He will be able to return to light golf activity drills, chipping and putting activity without difficulty    Time 8    Period Weeks    Status New                   Plan - 04/08/21 1449     Clinical Impression Statement Introduction of active movements were beneficial and tolerated well by Pt. in clinic use today.  Progressed to use AAROM/AROM primarily at home in HEP.  Continued skilled PT services indicated at this time.    Personal Factors and Comorbidities Comorbidity 1    Comorbidities PMH:Rt RTC repain 02/09/21, Rt THA, insomnia,    Examination-Activity Limitations Lift;Carry;Reach Overhead;Sleep    Examination-Participation Restrictions Cleaning;Driving;Yard Work;Occupation;Other   golf   Stability/Clinical Decision Making Stable/Uncomplicated    Rehab Potential Excellent    PT Frequency 2x / week   1-2   PT Duration 8 weeks   6-8   PT Treatment/Interventions ADLs/Self Care Home Management;Cryotherapy;Electrical Stimulation;Iontophoresis 4mg /ml Dexamethasone;Moist Heat;Ultrasound;Therapeutic activities;Therapeutic exercise;Neuromuscular re-education;Patient/family education;Manual techniques;Passive range of motion;Dry needling;Joint Manipulations;Vasopneumatic Device;Taping    PT Next Visit Plan Check AROM for data collection, strength assessment (movement against gravity - watch shrug).  Continued early AROM in gravity reduced positioning.    PT Home Exercise  Plan Access Code: [email or web address]    Consulted and Agree with Plan of Care Patient             Patient will benefit from  skilled therapeutic intervention in order to improve the following deficits and impairments:  Decreased activity tolerance, Decreased range of motion, Decreased strength, Increased edema, Impaired UE functional use, Pain  Visit Diagnosis: Acute pain of right shoulder  Muscle weakness (generalized)  Localized edema     Problem List Patient Active Problem List   Diagnosis Date Noted   COVID-19 01/05/2021   Erectile dysfunction 08/30/2020   Acute pansinusitis 02/27/2018   Unilateral primary osteoarthritis, right hip 05/03/2017   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   Tinnitus 10/12/2016   Unilateral primary osteoarthritis, left hip 03/16/2016   Status post left hip replacement 03/16/2016   Skin lesion of left leg 10/07/2015   Otitis, externa, infective 03/31/2015   Right hip pain 08/11/2014   Colon polyp 08/11/2014   Insomnia 01/09/2014   Hyperglycemia 01/09/2014   Eczema 01/09/2014   Enlarged prostate 01/30/2013   Esophageal stricture 01/30/2013   Esophageal ulcer 01/30/2013   Overweight 11/14/2012   Esophageal reflux 05/21/2012   Hyperlipidemia 01/09/2012   Preventative health care 09/29/2011   Epicondylitis elbow, medial 04/20/2011   HTN (hypertension) 03/27/2011   Achalasia 03/27/2011    Scot Jun, PT, DPT, OCS, ATC 04/08/21  2:55 PM    [facility] [address] [place], [place], [address] Phone: [phone]   Fax:  [phone]  Name: Gabriel [name] MRN: [id number] Date of Birth: 26-Oct-1950

## 2021-04-15 ENCOUNTER — Other Ambulatory Visit: Payer: Self-pay

## 2021-04-15 ENCOUNTER — Encounter: Payer: Self-pay | Admitting: Rehabilitative and Restorative Service Providers"

## 2021-04-15 ENCOUNTER — Ambulatory Visit: Payer: Medicare Other | Admitting: Rehabilitative and Restorative Service Providers"

## 2021-04-15 DIAGNOSIS — M6281 Muscle weakness (generalized): Secondary | ICD-10-CM | POA: Diagnosis not present

## 2021-04-15 DIAGNOSIS — R600 Localized edema: Secondary | ICD-10-CM

## 2021-04-15 DIAGNOSIS — M25511 Pain in right shoulder: Secondary | ICD-10-CM | POA: Diagnosis not present

## 2021-04-15 NOTE — Therapy (Signed)
[facility] [address] [place], [place], [address] Phone: [phone]   Fax:  [phone]  Physical Therapy Treatment  Patient Details  Name: Gabriel [name] MRN: [id number] Date of Birth: 03/02/1951 Referring Provider (PT): Marlou Sa, Tonna Corner, MD,   Encounter Date: 04/15/2021   PT End of Session - 04/15/21 0803     Visit Number 4    Number of Visits 15    Date for PT Re-Evaluation 05/20/21    Authorization Type UHC MCR    PT Start Time 0758    PT Stop Time 0841    PT Time Calculation (min) 43 min    Activity Tolerance Patient tolerated treatment well    Behavior During Therapy [facility] for tasks assessed/performed             Past Medical History:  Diagnosis Date   Achalasia    Arthritis    Colon cancer screening 08/11/2014   Sees [facility], Dr Carlean Purl    Colon polyp 08/11/2014   Sees [facility], Dr Carlean Purl     Eczema 01/09/2014   Esophageal reflux 05/21/2012   Hyperlipidemia    Hypertension    Insomnia 01/09/2014   Left hip pain 08/11/2014   OA (osteoarthritis) of hip    right   Otitis, externa, infective 03/31/2015   Overweight 11/14/2012   Right hip pain 08/11/2014   Sees chiropractor, Dr Clovis Riley in [place]    Skin lesion of left leg 10/07/2015   Tinnitus 10/12/2016   Ulcer    esophageal ulcer hx   Wears contact lenses     Past Surgical History:  Procedure Laterality Date   COLONOSCOPY     ESOPHAGOGASTRODUODENOSCOPY     HELLER MYOTOMY  1999   help with swallowing   TONSILLECTOMY     TONSILLECTOMY AND [place] Left 03/16/2016   Procedure: LEFT TOTAL HIP ARTHROPLASTY ANTERIOR APPROACH;  Surgeon: Mcarthur Rossetti, MD;  Location: [place];  Service: Orthopedics;  Laterality: Left;   TOTAL HIP ARTHROPLASTY Right 05/03/2017   Procedure: RIGHT TOTAL HIP ARTHROPLASTY ANTERIOR APPROACH;  Surgeon: Mcarthur Rossetti, MD;  Location: [place];  Service: Orthopedics;  Laterality: Right;   UPPER GASTROINTESTINAL  ENDOSCOPY      There were no vitals filed for this visit.   Subjective Assessment - 04/15/21 0802     Subjective Pt. indicated feeling 2/10 sore /ache upon arrival today.  Pt. indicated he still noted soreness c computer work.  Pt. indicated use of arm and mobility has improved.  Pt. indicated front of upper arm gets sore every once and a while.    Currently in Pain? No/denies    Pain Score 0-No pain    Pain Onset More than a month ago                [facility] PT Assessment - 04/15/21 0001       Assessment   Medical Diagnosis Rt shoulder RTC repair 02/09/21    Referring Provider (PT) Meredith Pel, MD,    Onset Date/Surgical Date 02/09/22      Strength   Strength Assessment Site Shoulder    Right/Left Shoulder Left;Right    Right Shoulder Flexion --   8, 8.5 lbs   Right Shoulder ABduction --   9, 8.9 lbs   Right Shoulder External Rotation --   10.6, 12 lbs   Left Shoulder Flexion 5/5   40.7, 38.3 lbs   Left Shoulder ABduction 5/5   29,  24 lbs   Left Shoulder External Rotation 5/5   22.8, 21.6 lbs                          [facility] Adult PT Treatment/Exercise - 04/15/21 0001       Neuro Re-ed    Neuro Re-ed Details  stabilizations in supine 90 deg flexion c mild resistance from clinician 20 sec bouts      Shoulder Exercises: Supine   Horizontal ABduction Both   2 x 10 green band   Flexion Right   2 x 15 1 lb   Shoulder Flexion Weight (lbs) 1    Other Supine Exercises supine 90 degree flexion circles ccw, cw 2 x 30 each way 2 lbs      Shoulder Exercises: Sidelying   ABduction Right   2 x 10 1 lb   ABduction Weight (lbs) 1      Shoulder Exercises: Standing   Extension 20 reps;Both    Theraband Level (Shoulder Extension) Level 3 (Green)    Row 20 reps;Both    Theraband Level (Shoulder Row) Level 3 (Green)    Other Standing Exercises biceps curl green band 2 x 10      Shoulder Exercises: Pulleys   Other Pulley Exercises Pt. has pulleys for home  going forward      Shoulder Exercises: ROM/Strengthening   UBE (Upper Arm Bike) Lvl 4.0 3 mins fwd/back each way      Manual Therapy   Manual therapy comments supine inferior Rt GH joint mobs in flexion, scaption and abduction.  Mobilization c movement for ER c sustained posterior glide in 75 deg abduction.  Contract/relax techniques for ER mobility gains                       PT Short Term Goals - 04/08/21 1448       PT SHORT TERM GOAL #1   Title Pt will be I and compliant with HEP    Time 4    Period Weeks    Status On-going    Target Date 04/22/21      PT SHORT TERM GOAL #2   Title He will improve Rt shoulder PROM to [facility]    Time 4    Period Weeks    Status On-going               PT Long Term Goals - 04/15/21 0830       PT LONG TERM GOAL #1   Title Pt will improve FOTO functional score to 69%    Time 8    Period Weeks    Status On-going    Target Date 05/20/21      PT LONG TERM GOAL #2   Title Pt will improve Rt shoulder AROM to [facility] and PROM to WNL    Time 8    Period Weeks    Status On-going      PT LONG TERM GOAL #3   Title Pt will improve Rt shoulder strength to at least 4+/5 all planes to improve functional lifiting    Time 8    Period Weeks    Status On-going      PT LONG TERM GOAL #4   Title Pt will reduce pain to overall 3 or less out of 10 with usual activity.    Time 8    Period Weeks    Status On-going  PT LONG TERM GOAL #5   Title He will be able to return to light golf activity drills, chipping and putting activity without difficulty    Time 8    Period Weeks    Status On-going                   Plan - 04/15/21 8546     Clinical Impression Statement First official strength testing today c dynamometer revealed noted restrictions in Rt shoulder as expected.  Continued emphasis in clinic activity to continue progression of ER mobility gains as well as improving muscle activation and control to faclitate  improved elevation attempts.  Continued skilled PT services warranted at this time.    Personal Factors and Comorbidities Comorbidity 1    Comorbidities PMH:Rt RTC repain 02/09/21, Rt THA, insomnia,    Examination-Activity Limitations Lift;Carry;Reach Overhead;Sleep    Examination-Participation Restrictions Cleaning;Driving;Yard Work;Occupation;Other   golf   Stability/Clinical Decision Making Stable/Uncomplicated    Rehab Potential Excellent    PT Frequency 2x / week   1-2   PT Duration 8 weeks   6-8   PT Treatment/Interventions ADLs/Self Care Home Management;Cryotherapy;Electrical Stimulation;Iontophoresis 4mg /ml Dexamethasone;Moist Heat;Ultrasound;Therapeutic activities;Therapeutic exercise;Neuromuscular re-education;Patient/family education;Manual techniques;Passive range of motion;Dry needling;Joint Manipulations;Vasopneumatic Device;Taping    PT Next Visit Plan Gravity reduced active movement for strengthening (avoid shrug)    PT Home Exercise Plan Access Code: [email or web address]    Consulted and Agree with Plan of Care Patient             Patient will benefit from skilled therapeutic intervention in order to improve the following deficits and impairments:  Decreased activity tolerance, Decreased range of motion, Decreased strength, Increased edema, Impaired UE functional use, Pain  Visit Diagnosis: Acute pain of right shoulder  Muscle weakness (generalized)  Localized edema     Problem List Patient Active Problem List   Diagnosis Date Noted   COVID-19 01/05/2021   Erectile dysfunction 08/30/2020   Acute pansinusitis 02/27/2018   Unilateral primary osteoarthritis, right hip 05/03/2017   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   Tinnitus 10/12/2016   Unilateral primary osteoarthritis, left hip 03/16/2016   Status post left hip replacement 03/16/2016   Skin lesion of left leg 10/07/2015   Otitis, externa, infective 03/31/2015   Right hip pain 08/11/2014   Colon  polyp 08/11/2014   Insomnia 01/09/2014   Hyperglycemia 01/09/2014   Eczema 01/09/2014   Enlarged prostate 01/30/2013   Esophageal stricture 01/30/2013   Esophageal ulcer 01/30/2013   Overweight 11/14/2012   Esophageal reflux 05/21/2012   Hyperlipidemia 01/09/2012   Preventative health care 09/29/2011   Epicondylitis elbow, medial 04/20/2011   HTN (hypertension) 03/27/2011   Achalasia 03/27/2011    Scot Jun, PT, DPT, OCS, ATC 04/15/21  8:38 AM    [facility] [address] [place], [place], [address] Phone: [phone]   Fax:  [phone]  Name: Gabriel [name] MRN: [id number] Date of Birth: 1950/04/29

## 2021-04-20 ENCOUNTER — Encounter: Payer: Medicare Other | Admitting: Family Medicine

## 2021-04-21 ENCOUNTER — Other Ambulatory Visit: Payer: Self-pay | Admitting: Family Medicine

## 2021-04-21 DIAGNOSIS — K220 Achalasia of cardia: Secondary | ICD-10-CM

## 2021-04-21 DIAGNOSIS — R1310 Dysphagia, unspecified: Secondary | ICD-10-CM

## 2021-04-21 DIAGNOSIS — K222 Esophageal obstruction: Secondary | ICD-10-CM

## 2021-04-21 DIAGNOSIS — K2100 Gastro-esophageal reflux disease with esophagitis, without bleeding: Secondary | ICD-10-CM

## 2021-04-22 ENCOUNTER — Encounter: Payer: Self-pay | Admitting: Physical Therapy

## 2021-04-22 ENCOUNTER — Other Ambulatory Visit: Payer: Self-pay

## 2021-04-22 ENCOUNTER — Ambulatory Visit: Payer: Medicare Other | Admitting: Physical Therapy

## 2021-04-22 DIAGNOSIS — M6281 Muscle weakness (generalized): Secondary | ICD-10-CM

## 2021-04-22 DIAGNOSIS — M25511 Pain in right shoulder: Secondary | ICD-10-CM | POA: Diagnosis not present

## 2021-04-22 DIAGNOSIS — R600 Localized edema: Secondary | ICD-10-CM

## 2021-04-22 NOTE — Therapy (Signed)
[facility] [address] [place], [place], [address] Phone: [phone]   Fax:  [phone]  Physical Therapy Treatment  Patient Details  Name: Gabriel [name] MRN: [id number] Date of Birth: [birth year] Referring Provider (PT): Marlou Sa, Tonna Corner, MD,   Encounter Date: 04/22/2021   PT End of Session - 04/22/21 0850     Visit Number 5    Number of Visits 15    Date for PT Re-Evaluation 05/20/21    Authorization Type UHC MCR    PT Start Time 0805    PT Stop Time 0845    PT Time Calculation (min) 40 min    Activity Tolerance Patient tolerated treatment well    Behavior During Therapy [facility] for tasks assessed/performed             Past Medical History:  Diagnosis Date   Achalasia    Arthritis    Colon cancer screening 08/11/2014   Sees [facility], Dr Carlean Purl    Colon polyp 08/11/2014   Se[facility], Dr Carlean Purl     Eczema 01/09/2014   Esophageal reflux 05/21/2012   Hyperlipidemia    Hypertension    Insomnia 01/09/2014   Left hip pain 08/11/2014   OA (osteoarthritis) of hip    right   Otitis, externa, infective 03/31/2015   Overweight 11/14/2012   Right hip pain 08/11/2014   Sees chiropractor, Dr Clovis Riley in [place]    Skin lesion of left leg 10/07/2015   Tinnitus 10/12/2016   Ulcer    esophageal ulcer hx   Wears contact lenses     Past Surgical History:  Procedure Laterality Date   COLONOSCOPY     ESOPHAGOGASTRODUODENOSCOPY     HELLER MYOTOMY  1999   help with swallowing   TONSILLECTOMY     TONSILLECTOMY AND [place] Left 03/16/2016   Procedure: LEFT TOTAL HIP ARTHROPLASTY ANTERIOR APPROACH;  Surgeon: Mcarthur Rossetti, MD;  Location: [place];  Service: Orthopedics;  Laterality: Left;   TOTAL HIP ARTHROPLASTY Right 05/03/2017   Procedure: RIGHT TOTAL HIP ARTHROPLASTY ANTERIOR APPROACH;  Surgeon: Mcarthur Rossetti, MD;  Location: [place];  Service: Orthopedics;  Laterality: Right;   UPPER GASTROINTESTINAL  ENDOSCOPY      There were no vitals filed for this visit.   Subjective Assessment - 04/22/21 0820     Subjective Pt. reports 0/10 pain upon arrival but pain with abduction of arm earlier this morning. Pt. has more soreness than pain but overall feels improvement in the shoulder.    Pain Onset More than a month ago                               [facility] Adult PT Treatment/Exercise - 04/22/21 0001       Shoulder Exercises: Standing   External Rotation 20 reps;Right    Theraband Level (Shoulder External Rotation) Level 2 (Red)    Internal Rotation 20 reps;Right    Theraband Level (Shoulder Internal Rotation) Level 2 (Red)    Flexion 10 reps;Both;AROM    Flexion Limitations Flexion in mirror, cues to avoid shrug of R shoulder    Extension 20 reps;Both    Theraband Level (Shoulder Extension) Level 3 (Green)    Row 20 reps;Both    Theraband Level (Shoulder Row) Level 3 (Green)    Other Standing Exercises Wall pushups 3 x 5reps.    Other Standing Exercises Cw and ccw ball rolls  in standing against wall, 2 x 30sec. in each direction in flexion and abduction      Shoulder Exercises: ROM/Strengthening   UBE (Upper Arm Bike) Lvl 4.0 3 mins fwd/back each way                       PT Short Term Goals - 04/08/21 1448       PT SHORT TERM GOAL #1   Title Pt will be I and compliant with HEP    Time 4    Period Weeks    Status On-going    Target Date 04/22/21      PT SHORT TERM GOAL #2   Title He will improve Rt shoulder PROM to [facility]    Time 4    Period Weeks    Status On-going               PT Long Term Goals - 04/15/21 0830       PT LONG TERM GOAL #1   Title Pt will improve FOTO functional score to 69%    Time 8    Period Weeks    Status On-going    Target Date 05/20/21      PT LONG TERM GOAL #2   Title Pt will improve Rt shoulder AROM to [facility] and PROM to WNL    Time 8    Period Weeks    Status On-going      PT LONG TERM GOAL #3    Title Pt will improve Rt shoulder strength to at least 4+/5 all planes to improve functional lifiting    Time 8    Period Weeks    Status On-going      PT LONG TERM GOAL #4   Title Pt will reduce pain to overall 3 or less out of 10 with usual activity.    Time 8    Period Weeks    Status On-going      PT LONG TERM GOAL #5   Title He will be able to return to light golf activity drills, chipping and putting activity without difficulty    Time 8    Period Weeks    Status On-going                   Plan - 04/22/21 1478     Clinical Impression Statement Added ER and IR with red theraband, progressed patients HEP. Introduced wall push ups, and circular ball rolls on wall pt. stated muscles felt tired but motion was helpful to the shoulder soreness.    Personal Factors and Comorbidities Comorbidity 1    Comorbidities PMH:Rt RTC repain 02/09/21, Rt THA, insomnia,    Examination-Activity Limitations Lift;Carry;Reach Overhead;Sleep    Examination-Participation Restrictions Cleaning;Driving;Yard Work;Occupation;Other   golf   Stability/Clinical Decision Making Stable/Uncomplicated    Rehab Potential Excellent    PT Frequency 2x / week   1-2   PT Duration 8 weeks   6-8   PT Treatment/Interventions ADLs/Self Care Home Management;Cryotherapy;Electrical Stimulation;Iontophoresis 4mg /ml Dexamethasone;Moist Heat;Ultrasound;Therapeutic activities;Therapeutic exercise;Neuromuscular re-education;Patient/family education;Manual techniques;Passive range of motion;Dry needling;Joint Manipulations;Vasopneumatic Device;Taping    PT Next Visit Plan Discuss with pt. about how progressed HEP plan felt, and increase reps of exercises if applicable    PT Home Exercise Plan Access Code: [email or web address]    Consulted and Agree with Plan of Care Patient             Patient will benefit from skilled therapeutic intervention in order to  improve the following deficits and impairments:  Decreased activity  tolerance, Decreased range of motion, Decreased strength, Increased edema, Impaired UE functional use, Pain  Visit Diagnosis: Acute pain of right shoulder  Muscle weakness (generalized)  Localized edema     Problem List Patient Active Problem List   Diagnosis Date Noted   COVID-19 01/05/2021   Erectile dysfunction 08/30/2020   Acute pansinusitis 02/27/2018   Unilateral primary osteoarthritis, right hip 05/03/2017   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   Tinnitus 10/12/2016   Unilateral primary osteoarthritis, left hip 03/16/2016   Status post left hip replacement 03/16/2016   Skin lesion of left leg 10/07/2015   Otitis, externa, infective 03/31/2015   Right hip pain 08/11/2014   Colon polyp 08/11/2014   Insomnia 01/09/2014   Hyperglycemia 01/09/2014   Eczema 01/09/2014   Enlarged prostate 01/30/2013   Esophageal stricture 01/30/2013   Esophageal ulcer 01/30/2013   Overweight 11/14/2012   Esophageal reflux 05/21/2012   Hyperlipidemia 01/09/2012   Preventative health care 09/29/2011   Epicondylitis elbow, medial 04/20/2011   HTN (hypertension) 03/27/2011   Achalasia 49/20/1007      Wilson Singer, SPT    Read, reviewed, edited and agree with student's findings and recommendations.    Debbe Odea, PT,DPT 04/22/2021, 8:57 AM  [facility] [address] [place], [place], [address] Phone: [phone]   Fax:  [phone]  Name: Sherlock Nancarrow MRN: [id number] Date of Birth: 04-Sep-1950

## 2021-04-27 ENCOUNTER — Other Ambulatory Visit: Payer: Self-pay

## 2021-04-27 ENCOUNTER — Encounter: Payer: Self-pay | Admitting: Physical Therapy

## 2021-04-27 ENCOUNTER — Ambulatory Visit: Payer: Medicare Other | Admitting: Physical Therapy

## 2021-04-27 DIAGNOSIS — R600 Localized edema: Secondary | ICD-10-CM

## 2021-04-27 DIAGNOSIS — M6281 Muscle weakness (generalized): Secondary | ICD-10-CM

## 2021-04-27 DIAGNOSIS — M25511 Pain in right shoulder: Secondary | ICD-10-CM | POA: Diagnosis not present

## 2021-04-27 NOTE — Therapy (Signed)
[facility] [address] [place], [place], [address] Phone: [phone]   Fax:  [phone]  Physical Therapy Treatment  Patient Details  Name: Gabriel [name] MRN: [id number] Date of Birth: 1950-12-25 Referring Provider (PT): Marlou Sa, Tonna Corner, MD,   Encounter Date: 04/27/2021   PT End of Session - 04/27/21 0911     Visit Number 6    Number of Visits 15    Date for PT Re-Evaluation 05/20/21    Authorization Type UHC MCR    PT Start Time 0800    PT Stop Time 0845    PT Time Calculation (min) 45 min    Activity Tolerance Patient tolerated treatment well    Behavior During Therapy [facility] for tasks assessed/performed             Past Medical History:  Diagnosis Date   Achalasia    Arthritis    Colon cancer screening 08/11/2014   Sees [facility], Dr Carlean Purl    Colon polyp 08/11/2014   Sees [facility], Dr Carlean Purl     Eczema 01/09/2014   Esophageal reflux 05/21/2012   Hyperlipidemia    Hypertension    Insomnia 01/09/2014   Left hip pain 08/11/2014   OA (osteoarthritis) of hip    right   Otitis, externa, infective 03/31/2015   Overweight 11/14/2012   Right hip pain 08/11/2014   Sees chiropractor, Dr Clovis Riley in [place]    Skin lesion of left leg 10/07/2015   Tinnitus 10/12/2016   Ulcer    esophageal ulcer hx   Wears contact lenses     Past Surgical History:  Procedure Laterality Date   COLONOSCOPY     ESOPHAGOGASTRODUODENOSCOPY     HELLER MYOTOMY  1999   help with swallowing   TONSILLECTOMY     TONSILLECTOMY AND [place] Left 03/16/2016   Procedure: LEFT TOTAL HIP ARTHROPLASTY ANTERIOR APPROACH;  Surgeon: Mcarthur Rossetti, MD;  Location: [place];  Service: Orthopedics;  Laterality: Left;   TOTAL HIP ARTHROPLASTY Right 05/03/2017   Procedure: RIGHT TOTAL HIP ARTHROPLASTY ANTERIOR APPROACH;  Surgeon: Mcarthur Rossetti, MD;  Location: [place];  Service: Orthopedics;  Laterality: Right;   UPPER GASTROINTESTINAL  ENDOSCOPY      There were no vitals filed for this visit.   Subjective Assessment - 04/27/21 0815     Subjective Pt.reports no pain but soreness in shoulder after last visit. He used a power tool over the weekend that was a good workout for his shoulder muscles, but had no pain while using this equipment.                [facility] PT Assessment - 04/27/21 0001       Assessment   Medical Diagnosis Rt shoulder RTC repair 02/09/21    Referring Provider (PT) Meredith Pel, MD,    Onset Date/Surgical Date 02/09/22      ROM / Strength   AROM / PROM / Strength AROM      AROM   AROM Assessment Site Shoulder    Right/Left Shoulder Right    Right Shoulder Flexion 119 Degrees    Right Shoulder ABduction 115 Degrees    Right Shoulder Internal Rotation --   Rt SIJ reaching behind back   Right Shoulder External Rotation --   occiput reaching behind head     PROM   Right Shoulder Flexion 132 Degrees    Right Shoulder ABduction 107 Degrees    Right Shoulder Internal Rotation  41 Degrees    Right Shoulder External Rotation 39 Degrees                           [facility] Adult PT Treatment/Exercise - 04/27/21 0001       Shoulder Exercises: Standing   External Rotation 20 reps;Right    Theraband Level (Shoulder External Rotation) Level 2 (Red)    Internal Rotation 20 reps;Right    Theraband Level (Shoulder Internal Rotation) Level 3 (Green)    Extension Other (comment)   3 x 10 reps; BOTH   Theraband Level (Shoulder Extension) Level 4 (Blue)    Row Other (comment)   3 x 10 reps; BOTH; AROM   Theraband Level (Shoulder Row) Level 4 (Blue)    Other Standing Exercises Cw and ccw ball rolls in standing against wall, 3 x 30sec. in flexion and abduction      Shoulder Exercises: ROM/Strengthening   UBE (Upper Arm Bike) Lvl 4.0 3 mins fwd/back each way      Manual Therapy   Manual Therapy Joint mobilization   Flexion and Abduction stretching; IR (Posterior mob)/ ER  (Anterior mob)                      PT Short Term Goals - 04/27/21 0930       PT SHORT TERM GOAL #1   Title Pt will be I and compliant with HEP    Baseline encouraged more stretching    Time 4    Period Weeks    Status On-going    Target Date 04/22/21      PT SHORT TERM GOAL #2   Title He will improve Rt shoulder PROM to [facility]    Baseline mild limitations    Time 4    Period Weeks    Status On-going               PT Long Term Goals - 04/15/21 0830       PT LONG TERM GOAL #1   Title Pt will improve FOTO functional score to 69%    Time 8    Period Weeks    Status On-going    Target Date 05/20/21      PT LONG TERM GOAL #2   Title Pt will improve Rt shoulder AROM to [facility] and PROM to WNL    Time 8    Period Weeks    Status On-going      PT LONG TERM GOAL #3   Title Pt will improve Rt shoulder strength to at least 4+/5 all planes to improve functional lifiting    Time 8    Period Weeks    Status On-going      PT LONG TERM GOAL #4   Title Pt will reduce pain to overall 3 or less out of 10 with usual activity.    Time 8    Period Weeks    Status On-going      PT LONG TERM GOAL #5   Title He will be able to return to light golf activity drills, chipping and putting activity without difficulty    Time 8    Period Weeks    Status On-going                   Plan - 04/27/21 0907     Clinical Impression Statement Pt. progressed to blue theraband with shoulder rows, and extension. New measurements were  taken today, showing progression in flexion, abduction, and IR/ ER AROM however he does still have limitations in these areas. Stretching and joint mobilizations were completed today to improve overall shoulder mobility.    Comorbidities PMH:Rt RTC repain 02/09/21, Rt THA, insomnia,    Examination-Activity Limitations Lift;Carry;Reach Overhead;Sleep    Examination-Participation Restrictions Cleaning;Driving;Yard Work;Occupation;Other     Stability/Clinical Decision Making Stable/Uncomplicated    PT Duration 8 weeks    PT Treatment/Interventions ADLs/Self Care Home Management;Cryotherapy;Electrical Stimulation;Iontophoresis 4mg /ml Dexamethasone;Moist Heat;Ultrasound;Therapeutic activities;Therapeutic exercise;Neuromuscular re-education;Patient/family education;Manual techniques;Passive range of motion;Dry needling;Joint Manipulations;Vasopneumatic Device;Taping    PT Next Visit Plan Pt. was informed to add more stretching following his HEP. Follow up with pt. and continue to progress exercises with increased repetitions and resistance.    PT Home Exercise Plan Access Code: [email or web address]    Consulted and Agree with Plan of Care Patient             Patient will benefit from skilled therapeutic intervention in order to improve the following deficits and impairments:  Decreased activity tolerance, Decreased range of motion, Decreased strength, Increased edema, Impaired UE functional use, Pain  Visit Diagnosis: Acute pain of right shoulder  Muscle weakness (generalized)  Localized edema     Problem List Patient Active Problem List   Diagnosis Date Noted   COVID-19 01/05/2021   Erectile dysfunction 08/30/2020   Acute pansinusitis 02/27/2018   Unilateral primary osteoarthritis, right hip 05/03/2017   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   Tinnitus 10/12/2016   Unilateral primary osteoarthritis, left hip 03/16/2016   Status post left hip replacement 03/16/2016   Skin lesion of left leg 10/07/2015   Otitis, externa, infective 03/31/2015   Right hip pain 08/11/2014   Colon polyp 08/11/2014   Insomnia 01/09/2014   Hyperglycemia 01/09/2014   Eczema 01/09/2014   Enlarged prostate 01/30/2013   Esophageal stricture 01/30/2013   Esophageal ulcer 01/30/2013   Overweight 11/14/2012   Esophageal reflux 05/21/2012   Hyperlipidemia 01/09/2012   Preventative health care 09/29/2011   Epicondylitis elbow, medial  04/20/2011   HTN (hypertension) 03/27/2011   Achalasia 35/45/6256    Mekisha Bittel Singer, Student-PT 04/27/2021, 10:30 AM  [facility] [address] [place], [place], [address] Phone: [phone]   Fax:  [phone]  Name: Gabriel [name] MRN: [id number] Date of Birth: 1950-10-21

## 2021-04-29 ENCOUNTER — Other Ambulatory Visit: Payer: Self-pay

## 2021-04-29 ENCOUNTER — Encounter: Payer: Self-pay | Admitting: Physical Therapy

## 2021-04-29 ENCOUNTER — Ambulatory Visit: Payer: Medicare Other | Admitting: Physical Therapy

## 2021-04-29 DIAGNOSIS — M25511 Pain in right shoulder: Secondary | ICD-10-CM | POA: Diagnosis not present

## 2021-04-29 DIAGNOSIS — M6281 Muscle weakness (generalized): Secondary | ICD-10-CM | POA: Diagnosis not present

## 2021-04-29 DIAGNOSIS — R600 Localized edema: Secondary | ICD-10-CM | POA: Diagnosis not present

## 2021-04-29 NOTE — Therapy (Signed)
[facility] [address] [place], [place], [address] Phone: [phone]   Fax:  [phone]  Physical Therapy Treatment  Patient Details  Name: Gabriel [name] MRN: [id number] Date of Birth: 18-May-1950 Referring Provider (PT): Marlou Sa, Tonna Corner, MD,   Encounter Date: 04/29/2021   PT End of Session - 04/29/21 0853     Visit Number 7    Number of Visits 15    Date for PT Re-Evaluation 05/20/21    Authorization Type UHC MCR    PT Start Time 0802    PT Stop Time 0847    PT Time Calculation (min) 45 min    Activity Tolerance Patient tolerated treatment well    Behavior During Therapy [facility] for tasks assessed/performed             Past Medical History:  Diagnosis Date   Achalasia    Arthritis    Colon cancer screening 08/11/2014   Sees [facility], Dr Carlean Purl    Colon polyp 08/11/2014   Sees [facility], Dr Carlean Purl     Eczema 01/09/2014   Esophageal reflux 05/21/2012   Hyperlipidemia    Hypertension    Insomnia 01/09/2014   Left hip pain 08/11/2014   OA (osteoarthritis) of hip    right   Otitis, externa, infective 03/31/2015   Overweight 11/14/2012   Right hip pain 08/11/2014   Sees chiropractor, Dr Clovis Riley in [place]    Skin lesion of left leg 10/07/2015   Tinnitus 10/12/2016   Ulcer    esophageal ulcer hx   Wears contact lenses     Past Surgical History:  Procedure Laterality Date   COLONOSCOPY     ESOPHAGOGASTRODUODENOSCOPY     HELLER MYOTOMY  1999   help with swallowing   TONSILLECTOMY     TONSILLECTOMY AND ADENOIDECTOMY  1963   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   Procedure: LEFT TOTAL HIP ARTHROPLASTY ANTERIOR APPROACH;  Surgeon: Mcarthur Rossetti, MD;  Location: [place];  Service: Orthopedics;  Laterality: Left;   TOTAL HIP ARTHROPLASTY Right 05/03/2017   Procedure: RIGHT TOTAL HIP ARTHROPLASTY ANTERIOR APPROACH;  Surgeon: Mcarthur Rossetti, MD;  Location: [place];  Service: Orthopedics;  Laterality: Right;   UPPER GASTROINTESTINAL  ENDOSCOPY      There were no vitals filed for this visit.   Subjective Assessment - 04/29/21 0809     Subjective Pt. reports no pain and slight soreness this morning. Pt. worked on bathroom repairs over the weekend and had no dificulty with handling the tools, only fatigue with continued use of shoulder muscles over time.    Pain Onset More than a month ago                               [facility] Adult PT Treatment/Exercise - 04/29/21 0001       Shoulder Exercises: Standing   External Rotation 20 reps;Right    Theraband Level (Shoulder External Rotation) Level 2 (Red)    Extension Other (comment)   3 x 10 reps;Both; AROM   Theraband Level (Shoulder Extension) Level 4 (Blue)    Row Other (comment)   3 x 10 reps; BOTH; AROM   Theraband Level (Shoulder Row) Level 4 (Blue)    Other Standing Exercises Cw and ccw ball rolls in standing against wall, 3 x 30sec. in flexion and abduction at 90 deg. Cw and ccw ball rolls in standing against wall, 3 x 30sec. in flexion only overhead  Other Standing Exercises Red theraband tied around both hands against wall. move hands in clock pattern (up, middle, down) 3x each side. Red theraband tied around hands active flexion with tension on band 2 x 10 reps      Shoulder Exercises: ROM/Strengthening   UBE (Upper Arm Bike) Lvl 4.0 3 mins fwd/back each way    Other ROM/Strengthening Exercises --    Other ROM/Strengthening Exercises --      Shoulder Exercises: Stretch   Other Shoulder Stretches Doorway R only middle (ER) stretch x 10 5 sec hold    Other Shoulder Stretches Doorway R only high (flexion) stretch x 10 5 sec hold      Manual Therapy   Manual Therapy --    Manual therapy comments PROM Flexion, Abduction, ER stretching with Posterior and Anterior mobilizations                       PT Short Term Goals - 04/27/21 0930       PT SHORT TERM GOAL #1   Title Pt will be I and compliant with HEP    Baseline  encouraged more stretching    Time 4    Period Weeks    Status On-going    Target Date 04/22/21      PT SHORT TERM GOAL #2   Title He will improve Rt shoulder PROM to [facility]    Baseline mild limitations    Time 4    Period Weeks    Status On-going               PT Long Term Goals - 04/15/21 0830       PT LONG TERM GOAL #1   Title Pt will improve FOTO functional score to 69%    Time 8    Period Weeks    Status On-going    Target Date 05/20/21      PT LONG TERM GOAL #2   Title Pt will improve Rt shoulder AROM to [facility] and PROM to WNL    Time 8    Period Weeks    Status On-going      PT LONG TERM GOAL #3   Title Pt will improve Rt shoulder strength to at least 4+/5 all planes to improve functional lifiting    Time 8    Period Weeks    Status On-going      PT LONG TERM GOAL #4   Title Pt will reduce pain to overall 3 or less out of 10 with usual activity.    Time 8    Period Weeks    Status On-going      PT LONG TERM GOAL #5   Title He will be able to return to light golf activity drills, chipping and putting activity without difficulty    Time 8    Period Weeks    Status On-going                   Plan - 04/29/21 6578     Clinical Impression Statement Pt. tolerated treatment well. Additional endurance and strengthening exercises were added into today's session to assist with muscle fatigue with activity. Stretching exercises were also emphasized today to help with overall soreness of shoulder. He is progressing very well with PT up to this point.    Personal Factors and Comorbidities Comorbidity 1    Comorbidities PMH:Rt RTC repain 02/09/21, Rt THA, insomnia,    Examination-Activity Limitations Lift;Carry;Reach Overhead;Sleep  Examination-Participation Restrictions Cleaning;Driving;Yard Work;Occupation;Other    Stability/Clinical Decision Making Stable/Uncomplicated    Clinical Decision Making Low    PT Frequency 2x / week    PT Duration 8 weeks     PT Treatment/Interventions ADLs/Self Care Home Management;Cryotherapy;Electrical Stimulation;Iontophoresis 4mg /ml Dexamethasone;Moist Heat;Ultrasound;Therapeutic activities;Therapeutic exercise;Neuromuscular re-education;Patient/family education;Manual techniques;Passive range of motion;Dry needling;Joint Manipulations;Vasopneumatic Device;Taping    PT Next Visit Plan Continue to progress exercises to increase overall strength and endurance of shoulder muscles.    PT Home Exercise Plan Access Code: [email or web address]    Consulted and Agree with Plan of Care Patient             Patient will benefit from skilled therapeutic intervention in order to improve the following deficits and impairments:  Decreased activity tolerance, Decreased range of motion, Decreased strength, Increased edema, Impaired UE functional use, Pain  Visit Diagnosis: Acute pain of right shoulder  Muscle weakness (generalized)  Localized edema     Problem List Patient Active Problem List   Diagnosis Date Noted   COVID-19 01/05/2021   Erectile dysfunction 08/30/2020   Acute pansinusitis 02/27/2018   Unilateral primary osteoarthritis, right hip 05/03/2017   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   Tinnitus 10/12/2016   Unilateral primary osteoarthritis, left hip 03/16/2016   Status post left hip replacement 03/16/2016   Skin lesion of left leg 10/07/2015   Otitis, externa, infective 03/31/2015   Right hip pain 08/11/2014   Colon polyp 08/11/2014   Insomnia 01/09/2014   Hyperglycemia 01/09/2014   Eczema 01/09/2014   Enlarged prostate 01/30/2013   Esophageal stricture 01/30/2013   Esophageal ulcer 01/30/2013   Overweight 11/14/2012   Esophageal reflux 05/21/2012   Hyperlipidemia 01/09/2012   Preventative health care 09/29/2011   Epicondylitis elbow, medial 04/20/2011   HTN (hypertension) 03/27/2011   Achalasia 03/50/0938    Jordon Kristiansen Singer, Student-PT 04/29/2021, 9:13 AM  [facility] [address] [place], [place], [address] Phone: [phone]   Fax:  [phone]  Name: Gabriel [name] MRN: [id number] Date of Birth: 04-Sep-1950

## 2021-05-04 ENCOUNTER — Other Ambulatory Visit: Payer: Self-pay

## 2021-05-04 ENCOUNTER — Ambulatory Visit: Payer: Medicare Other | Admitting: Physical Therapy

## 2021-05-04 ENCOUNTER — Encounter: Payer: Self-pay | Admitting: Physical Therapy

## 2021-05-04 DIAGNOSIS — M25511 Pain in right shoulder: Secondary | ICD-10-CM

## 2021-05-04 DIAGNOSIS — R600 Localized edema: Secondary | ICD-10-CM

## 2021-05-04 DIAGNOSIS — M6281 Muscle weakness (generalized): Secondary | ICD-10-CM

## 2021-05-04 NOTE — Therapy (Signed)
[facility] [address] [place], [place], [address] Phone: [phone]   Fax:  [phone]  Physical Therapy Treatment  Patient Details  Name: Gabriel [name] MRN: [id number] Date of Birth: 04/20/1950 Referring Provider (PT): Marlou Sa, Tonna Corner, MD,   Encounter Date: 05/04/2021   PT End of Session - 05/04/21 0853     Visit Number 8    Number of Visits 15    Date for PT Re-Evaluation 05/20/21    Authorization Type UHC MCR    PT Start Time 0802    PT Stop Time 0847    PT Time Calculation (min) 45 min    Activity Tolerance Patient tolerated treatment well    Behavior During Therapy [facility] for tasks assessed/performed             Past Medical History:  Diagnosis Date   Achalasia    Arthritis    Colon cancer screening 08/11/2014   Se[facility], Dr Carlean Purl    Colon polyp 08/11/2014   Se[facility], Dr Carlean Purl     Eczema 01/09/2014   Esophageal reflux 05/21/2012   Hyperlipidemia    Hypertension    Insomnia 01/09/2014   Left hip pain 08/11/2014   OA (osteoarthritis) of hip    right   Otitis, externa, infective 03/31/2015   Overweight 11/14/2012   Right hip pain 08/11/2014   Sees chiropractor, Dr Clovis Riley in [place]    Skin lesion of left leg 10/07/2015   Tinnitus 10/12/2016   Ulcer    esophageal ulcer hx   Wears contact lenses     Past Surgical History:  Procedure Laterality Date   COLONOSCOPY     ESOPHAGOGASTRODUODENOSCOPY     HELLER MYOTOMY  1999   help with swallowing   TONSILLECTOMY     TONSILLECTOMY AND ADENOIDECTOMY  1963   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   Procedure: LEFT TOTAL HIP ARTHROPLASTY ANTERIOR APPROACH;  Surgeon: Mcarthur Rossetti, MD;  Location: [place];  Service: Orthopedics;  Laterality: Left;   TOTAL HIP ARTHROPLASTY Right 05/03/2017   Procedure: RIGHT TOTAL HIP ARTHROPLASTY ANTERIOR APPROACH;  Surgeon: Mcarthur Rossetti, MD;  Location: [place];  Service: Orthopedics;  Laterality: Right;   UPPER GASTROINTESTINAL  ENDOSCOPY      There were no vitals filed for this visit.   Subjective Assessment - 05/04/21 0804     Subjective Pt. states that his R shoulder is sore today, and has been for most of the weekend. He believes it could possibly be from the rain over the weekend. No pain is noted today, however.    Pain Onset More than a month ago                [facility] PT Assessment - 05/04/21 0001       Assessment   Medical Diagnosis Rt shoulder RTC repair 02/09/21    Referring Provider (PT) Marlou Sa Tonna Corner, MD,    Onset Date/Surgical Date 02/09/22      AROM   AROM Assessment Site Shoulder    Right/Left Shoulder Right    Right Shoulder Flexion 140 Degrees                           [facility] Adult PT Treatment/Exercise - 05/04/21 0001       Shoulder Exercises: Standing   External Rotation Weights   2 x 10; At cable machine R UE only 5lbs   Internal Rotation Weights   2 x 10;  At cable machine 5lbs R UE only   Extension Other (comment)   2 x 10; At cable machine 10lbs BOTH UE   Row Other (comment);Weights   2 x 10; At cable machine 15lbs BOTH UE   Other Standing Exercises Cw and ccw ball rolls in standing against wall, 2 x 30sec. in flexion and abduction at 90 deg. Cw and ccw ball rolls in standing against wall,  2 x 30sec. in flexion only overhead    Other Standing Exercises Red theraband tied around both hands against wall. move hands in clock pattern (up, middle, down) 3x each side (x3). Red theraband tied around hands, sliding up wall into flexion with lift- off & squeeze at end 2 x 10 reps      Shoulder Exercises: ROM/Strengthening   UBE (Upper Arm Bike) Lvl 4.0 3 mins fwd/back each way      Shoulder Exercises: Stretch   Other Shoulder Stretches Doorway R only middle (ER) stretch x 10 5 sec hold    Other Shoulder Stretches Doorway R only high (flexion) stretch x 10 5 sec hold      Manual Therapy   Manual therapy comments PROM Flexion, Abduction stretching with  Posterior, Anterior and Inferior mobilizations                       PT Short Term Goals - 04/27/21 0930       PT SHORT TERM GOAL #1   Title Pt will be I and compliant with HEP    Baseline encouraged more stretching    Time 4    Period Weeks    Status On-going    Target Date 04/22/21      PT SHORT TERM GOAL #2   Title He will improve Rt shoulder PROM to [facility]    Baseline mild limitations    Time 4    Period Weeks    Status On-going               PT Long Term Goals - 04/15/21 0830       PT LONG TERM GOAL #1   Title Pt will improve FOTO functional score to 69%    Time 8    Period Weeks    Status On-going    Target Date 05/20/21      PT LONG TERM GOAL #2   Title Pt will improve Rt shoulder AROM to [facility] and PROM to WNL    Time 8    Period Weeks    Status On-going      PT LONG TERM GOAL #3   Title Pt will improve Rt shoulder strength to at least 4+/5 all planes to improve functional lifiting    Time 8    Period Weeks    Status On-going      PT LONG TERM GOAL #4   Title Pt will reduce pain to overall 3 or less out of 10 with usual activity.    Time 8    Period Weeks    Status On-going      PT LONG TERM GOAL #5   Title He will be able to return to light golf activity drills, chipping and putting activity without difficulty    Time 8    Period Weeks    Status On-going                   Plan - 05/04/21 0855     Clinical Impression Statement Pt. was  able to perform exercises well today, but there was some soreness with endurance exercises. Pt. was able to advance to the cable column for rows, extensions, IR, and ER exercises, where he had previously used the theraband. He had some difficulty with ER on the cable column towards the end of his last set which is to be expected, but was able to complete exercise.    Personal Factors and Comorbidities Comorbidity 1    Comorbidities PMH:Rt RTC repain 02/09/21, Rt THA, insomnia,     Examination-Activity Limitations Lift;Carry;Reach Overhead;Sleep    Examination-Participation Restrictions Cleaning;Driving;Yard Work;Occupation;Other    Stability/Clinical Decision Making Stable/Uncomplicated    PT Frequency 2x / week    PT Duration 8 weeks    PT Treatment/Interventions ADLs/Self Care Home Management;Cryotherapy;Electrical Stimulation;Iontophoresis 4mg /ml Dexamethasone;Moist Heat;Ultrasound;Therapeutic activities;Therapeutic exercise;Neuromuscular re-education;Patient/family education;Manual techniques;Passive range of motion;Dry needling;Joint Manipulations;Vasopneumatic Device;Taping    PT Next Visit Plan Follow up with patient on soreness from adding weight to shoulder exercises as tolerated    PT Home Exercise Plan Access Code: [email or web address]    Consulted and Agree with Plan of Care Patient             Patient will benefit from skilled therapeutic intervention in order to improve the following deficits and impairments:  Decreased activity tolerance, Decreased range of motion, Decreased strength, Increased edema, Impaired UE functional use, Pain  Visit Diagnosis: Acute pain of right shoulder  Muscle weakness (generalized)  Localized edema     Problem List Patient Active Problem List   Diagnosis Date Noted   COVID-19 01/05/2021   Erectile dysfunction 08/30/2020   Acute pansinusitis 02/27/2018   Unilateral primary osteoarthritis, right hip 05/03/2017   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   Tinnitus 10/12/2016   Unilateral primary osteoarthritis, left hip 03/16/2016   Status post left hip replacement 03/16/2016   Skin lesion of left leg 10/07/2015   Otitis, externa, infective 03/31/2015   Right hip pain 08/11/2014   Colon polyp 08/11/2014   Insomnia 01/09/2014   Hyperglycemia 01/09/2014   Eczema 01/09/2014   Enlarged prostate 01/30/2013   Esophageal stricture 01/30/2013   Esophageal ulcer 01/30/2013   Overweight 11/14/2012   Esophageal  reflux 05/21/2012   Hyperlipidemia 01/09/2012   Preventative health care 09/29/2011   Epicondylitis elbow, medial 04/20/2011   HTN (hypertension) 03/27/2011   Achalasia 54/12/8117    Stephenia Vogan Singer, Student-PT 05/04/2021, 10:06 AM  [facility] Physical Therapy [address] [place], [place], [address] Phone: [phone]   Fax:  [phone]  Name: Samaad Hashem MRN: [id number] Date of Birth: 20-Nov-1950

## 2021-05-06 ENCOUNTER — Encounter: Payer: Self-pay | Admitting: Physical Therapy

## 2021-05-06 ENCOUNTER — Ambulatory Visit: Payer: Medicare Other | Admitting: Physical Therapy

## 2021-05-06 ENCOUNTER — Other Ambulatory Visit: Payer: Self-pay

## 2021-05-06 DIAGNOSIS — M25511 Pain in right shoulder: Secondary | ICD-10-CM | POA: Diagnosis not present

## 2021-05-06 DIAGNOSIS — M6281 Muscle weakness (generalized): Secondary | ICD-10-CM | POA: Diagnosis not present

## 2021-05-06 DIAGNOSIS — R600 Localized edema: Secondary | ICD-10-CM

## 2021-05-06 NOTE — Therapy (Signed)
[facility] [address] [place], [place], [address] Phone: [phone]   Fax:  [phone]  Physical Therapy Treatment  Patient Details  Name: Gabriel [name] MRN: [id number] Date of Birth: [birth year] Referring Provider (PT): Marlou Sa, Tonna Corner, MD,   Encounter Date: 05/06/2021   PT End of Session - 05/06/21 0948     Visit Number 9    Number of Visits 15    Date for PT Re-Evaluation 05/20/21    Authorization Type UHC MCR    PT Start Time 0800    PT Stop Time 0845    PT Time Calculation (min) 45 min    Activity Tolerance Patient tolerated treatment well    Behavior During Therapy [facility] for tasks assessed/performed             Past Medical History:  Diagnosis Date   Achalasia    Arthritis    Colon cancer screening 08/11/2014   Sees [facility], Dr Carlean Purl    Colon polyp 08/11/2014   Sees [facility], Dr Carlean Purl     Eczema 01/09/2014   Esophageal reflux 05/21/2012   Hyperlipidemia    Hypertension    Insomnia 01/09/2014   Left hip pain 08/11/2014   OA (osteoarthritis) of hip    right   Otitis, externa, infective 03/31/2015   Overweight 11/14/2012   Right hip pain 08/11/2014   Sees chiropractor, Dr Clovis Riley in [place]    Skin lesion of left leg 10/07/2015   Tinnitus 10/12/2016   Ulcer    esophageal ulcer hx   Wears contact lenses     Past Surgical History:  Procedure Laterality Date   COLONOSCOPY     ESOPHAGOGASTRODUODENOSCOPY     HELLER MYOTOMY  1999   help with swallowing   TONSILLECTOMY     TONSILLECTOMY AND [place] Left 03/16/2016   Procedure: LEFT TOTAL HIP ARTHROPLASTY ANTERIOR APPROACH;  Surgeon: Mcarthur Rossetti, MD;  Location: [place];  Service: Orthopedics;  Laterality: Left;   TOTAL HIP ARTHROPLASTY Right 05/03/2017   Procedure: RIGHT TOTAL HIP ARTHROPLASTY ANTERIOR APPROACH;  Surgeon: Mcarthur Rossetti, MD;  Location: [place];  Service: Orthopedics;  Laterality: Right;   UPPER GASTROINTESTINAL  ENDOSCOPY      There were no vitals filed for this visit.   Subjective Assessment - 05/06/21 0809     Subjective Pt. states that his R shoulder felt better after the last visit. He has did some work that required using his shoulder and it went well. There is still a deficit with activities that involve overhead reaching.    Pain Onset More than a month ago                [facility] PT Assessment - 05/06/21 0001       Assessment   Medical Diagnosis Rt shoulder RTC repair 02/09/21    Referring Provider (PT) Marlou Sa Tonna Corner, MD,    Onset Date/Surgical Date 02/09/22                           [facility] Adult PT Treatment/Exercise - 05/06/21 0001       Shoulder Exercises: Supine   Other Supine Exercises Rhythmic Stabilizations in flexion at 90 deg. 2 x 30 sec. and at 120 deg. 1 x 30 sec.      Shoulder Exercises: Standing   Other Standing Exercises Red ball rolling up the wall both UE into flexion, stepping in at end  range 2 x 10    Other Standing Exercises UE ranger abduction stretch R UE only x 15; R elbow flexion with press up overhead 4# dumbbell 2 x 10; R shoulder post. capsule stretch x 10; 2 x 10 shoulder abduction lateral raise 2# both UE   for lateral raises started with 3# dropped weight down to 2#     Shoulder Exercises: Pulleys   Flexion --      Shoulder Exercises: ROM/Strengthening   UBE (Upper Arm Bike) Lvl 4.0 3 mins fwd/back each way      Manual Therapy   Manual therapy comments PROM Flexion, Abduction, ER and IR stretching with Posterior, Anterior and Inferior mobilizations                       PT Short Term Goals - 04/27/21 0930       PT SHORT TERM GOAL #1   Title Pt will be I and compliant with HEP    Baseline encouraged more stretching    Time 4    Period Weeks    Status On-going    Target Date 04/22/21      PT SHORT TERM GOAL #2   Title He will improve Rt shoulder PROM to [facility]    Baseline mild limitations    Time 4     Period Weeks    Status On-going               PT Long Term Goals - 04/15/21 0830       PT LONG TERM GOAL #1   Title Pt will improve FOTO functional score to 69%    Time 8    Period Weeks    Status On-going    Target Date 05/20/21      PT LONG TERM GOAL #2   Title Pt will improve Rt shoulder AROM to [facility] and PROM to WNL    Time 8    Period Weeks    Status On-going      PT LONG TERM GOAL #3   Title Pt will improve Rt shoulder strength to at least 4+/5 all planes to improve functional lifiting    Time 8    Period Weeks    Status On-going      PT LONG TERM GOAL #4   Title Pt will reduce pain to overall 3 or less out of 10 with usual activity.    Time 8    Period Weeks    Status On-going      PT LONG TERM GOAL #5   Title He will be able to return to light golf activity drills, chipping and putting activity without difficulty    Time 8    Period Weeks    Status On-going                   Plan - 05/06/21 0950     Clinical Impression Statement Today's session focused on overhead exercises because of weakness felt by patient with overhead activities. Pt. was able to focus on overhead flexion and abduction, with stretching at end range in these positions. Pt. performed dumbbell exercises that facilitated strengthening the R shoulder in overhead movements as well. Rhythmic stabilzations of the R shoulder were completed today to promote overall shoulder endurance. He does still show weakness with abduction and has shrug sign when more resistance was attempted. Pt. tolerated session well and will continue to benefit from PT to address weakness and ROM limitations  of the R shoulder.    Personal Factors and Comorbidities Comorbidity 1    Comorbidities PMH:Rt RTC repain 02/09/21, Rt THA, insomnia,    Examination-Activity Limitations Lift;Carry;Reach Overhead;Sleep    Examination-Participation Restrictions Cleaning;Driving;Yard Work;Occupation;Other     Stability/Clinical Decision Making Stable/Uncomplicated    PT Frequency 2x / week    PT Duration 8 weeks    PT Treatment/Interventions ADLs/Self Care Home Management;Cryotherapy;Electrical Stimulation;Iontophoresis 4mg /ml Dexamethasone;Moist Heat;Ultrasound;Therapeutic activities;Therapeutic exercise;Neuromuscular re-education;Patient/family education;Manual techniques;Passive range of motion;Dry needling;Joint Manipulations;Vasopneumatic Device;Taping    PT Next Visit Plan 10th visit progress note. Discuss how adding stretching and bilateral lateral raises with weight to HEP are progressing    PT Home Exercise Plan Access Code: [email or web address]    Consulted and Agree with Plan of Care Patient             Patient will benefit from skilled therapeutic intervention in order to improve the following deficits and impairments:  Decreased activity tolerance, Decreased range of motion, Decreased strength, Increased edema, Impaired UE functional use, Pain  Visit Diagnosis: Acute pain of right shoulder  Muscle weakness (generalized)  Localized edema     Problem List Patient Active Problem List   Diagnosis Date Noted   COVID-19 01/05/2021   Erectile dysfunction 08/30/2020   Acute pansinusitis 02/27/2018   Unilateral primary osteoarthritis, right hip 05/03/2017   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   Tinnitus 10/12/2016   Unilateral primary osteoarthritis, left hip 03/16/2016   Status post left hip replacement 03/16/2016   Skin lesion of left leg 10/07/2015   Otitis, externa, infective 03/31/2015   Right hip pain 08/11/2014   Colon polyp 08/11/2014   Insomnia 01/09/2014   Hyperglycemia 01/09/2014   Eczema 01/09/2014   Enlarged prostate 01/30/2013   Esophageal stricture 01/30/2013   Esophageal ulcer 01/30/2013   Overweight 11/14/2012   Esophageal reflux 05/21/2012   Hyperlipidemia 01/09/2012   Preventative health care 09/29/2011   Epicondylitis elbow, medial  04/20/2011   HTN (hypertension) 03/27/2011   Achalasia 68/06/2120    Gabriel [name], Student-PT 05/06/2021, 11:43 AM  [facility] [address] [place], [place], [address] Phone: [phone]   Fax:  [phone]  Name: Gabriel [name] MRN: [id number] Date of Birth: [birth year]

## 2021-05-11 ENCOUNTER — Ambulatory Visit: Payer: Medicare Other | Admitting: Physical Therapy

## 2021-05-11 ENCOUNTER — Encounter: Payer: Self-pay | Admitting: Physical Therapy

## 2021-05-11 ENCOUNTER — Other Ambulatory Visit: Payer: Self-pay

## 2021-05-11 DIAGNOSIS — R600 Localized edema: Secondary | ICD-10-CM

## 2021-05-11 DIAGNOSIS — M6281 Muscle weakness (generalized): Secondary | ICD-10-CM | POA: Diagnosis not present

## 2021-05-11 DIAGNOSIS — M25511 Pain in right shoulder: Secondary | ICD-10-CM | POA: Diagnosis not present

## 2021-05-11 NOTE — Therapy (Addendum)
[facility] [address] [place], [place], [address] Phone: [phone]   Fax:  [phone]  Physical Therapy Treatment/Progress note  Progress Note reporting period date 03/25/21 to 05/11/21  See below for objective and subjective measurements relating to patients progress with PT.   Patient Details  Name: Gabriel [name] MRN: [id number] Date of Birth: [birth year] Referring Provider (PT): Marlou Sa, Tonna Corner, MD,   Encounter Date: 05/11/2021   PT End of Session - 05/11/21 0848     Visit Number 10    Number of Visits 15    Date for PT Re-Evaluation 05/20/21    Authorization Type UHC MCR    PT Start Time 0800    PT Stop Time 0844    PT Time Calculation (min) 44 min    Activity Tolerance Patient tolerated treatment well    Behavior During Therapy [facility] for tasks assessed/performed             Past Medical History:  Diagnosis Date   Achalasia    Arthritis    Colon cancer screening 08/11/2014   Sees [facility], Dr Carlean Purl    Colon polyp 08/11/2014   Sees [facility], Dr Carlean Purl     Eczema 01/09/2014   Esophageal reflux 05/21/2012   Hyperlipidemia    Hypertension    Insomnia 01/09/2014   Left hip pain 08/11/2014   OA (osteoarthritis) of hip    right   Otitis, externa, infective 03/31/2015   Overweight 11/14/2012   Right hip pain 08/11/2014   Sees chiropractor, Dr Clovis Riley in [place]    Skin lesion of left leg 10/07/2015   Tinnitus 10/12/2016   Ulcer    esophageal ulcer hx   Wears contact lenses     Past Surgical History:  Procedure Laterality Date   COLONOSCOPY     ESOPHAGOGASTRODUODENOSCOPY     HELLER MYOTOMY  1999   help with swallowing   TONSILLECTOMY     TONSILLECTOMY AND ADENOIDECTOMY  1963   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   Procedure: LEFT TOTAL HIP ARTHROPLASTY ANTERIOR APPROACH;  Surgeon: Mcarthur Rossetti, MD;  Location: [place];  Service: Orthopedics;  Laterality: Left;   TOTAL HIP ARTHROPLASTY Right 05/03/2017   Procedure: RIGHT  TOTAL HIP ARTHROPLASTY ANTERIOR APPROACH;  Surgeon: Mcarthur Rossetti, MD;  Location: [place];  Service: Orthopedics;  Laterality: Right;   UPPER GASTROINTESTINAL ENDOSCOPY      There were no vitals filed for this visit.   Subjective Assessment - 05/11/21 0847     Subjective Pt. states that there is no pain in the R shoulder today, only soreness. He says after last session he felt sore, but after doing the HEP it helped relieve the shoulder pain. He states he has not had to take any pain medication in 2 days.    Pain Onset More than a month ago                [facility] PT Assessment - 05/11/21 0001       Assessment   Medical Diagnosis Rt shoulder RTC repair 02/09/21    Referring Provider (PT) Marlou Sa Tonna Corner, MD,    Onset Date/Surgical Date 02/09/22    Next MD Visit 05/20/2021      Observation/Other Assessments   Focus on Therapeutic Outcomes (FOTO)  70%      AROM   Right Shoulder Flexion 135 Degrees    Right Shoulder ABduction 130 Degrees    Right Shoulder Internal Rotation 42 Degrees    Right Shoulder  External Rotation 53 Degrees      PROM   Right Shoulder Flexion 146 Degrees    Right Shoulder ABduction 135 Degrees    Right Shoulder Internal Rotation 44 Degrees    Right Shoulder External Rotation 55 Degrees      Strength   Right Shoulder Flexion 4-/5    Right Shoulder Extension 4+/5    Right Shoulder ABduction 4-/5    Right Shoulder Internal Rotation 5/5    Right Shoulder External Rotation 4+/5                           [facility] Adult PT Treatment/Exercise - 05/11/21 0001       Shoulder Exercises: Standing   External Rotation AROM;Right;20 reps;Weights    External Rotation Weight (lbs) 5#, cable column    Row AROM;Both;20 reps;Weights    Row Weight (lbs) 15# cable column    Other Standing Exercises Standing AROM flexion with 3# dumbbells 2 sec. hold 2 x 10    Other Standing Exercises UE ranger abduction and flexion stretch R UE only x 12;       Shoulder Exercises: ROM/Strengthening   UBE (Upper Arm Bike) Lvl 4.2 3 mins fwd/back each way                       PT Short Term Goals - 04/27/21 0930       PT SHORT TERM GOAL #1   Title Pt will be I and compliant with HEP    Baseline encouraged more stretching    Time 4    Period Weeks    Status On-going    Target Date 04/22/21      PT SHORT TERM GOAL #2   Title He will improve Rt shoulder PROM to [facility]    Baseline mild limitations    Time 4    Period Weeks    Status On-going               PT Long Term Goals - 05/11/21 0902       PT LONG TERM GOAL #1   Title Pt will improve FOTO functional score to 69%    Time 8    Period Weeks    Status Achieved    Target Date 05/20/21      PT LONG TERM GOAL #2   Title Pt will improve Rt shoulder AROM to [facility] and PROM to WNL    Time 8    Period Weeks    Status On-going      PT LONG TERM GOAL #3   Title Pt will improve Rt shoulder strength to at least 4+/5 all planes to improve functional lifiting    Time 8    Period Weeks    Status On-going      PT LONG TERM GOAL #4   Title Pt will reduce pain to overall 3 or less out of 10 with usual activity.    Time 8    Period Weeks    Status Achieved      PT LONG TERM GOAL #5   Title He will be able to return to light golf activity drills, chipping and putting activity without difficulty    Time 8    Period Weeks    Status On-going                   Plan - 05/11/21 0849     Clinical Impression  Statement Progress was assessed at today's 10th visit. Pt.'s AROM/ PROM/ and Strength of the R shoulder were reassessed today showing improvements in AROM and PROM, as seen by the measurements today. The pt. does still have limitations with motions overhead, which are continuing to be addressed. The pt. is also progressing in strength measurements, however there are still notable limitations with R shoulder flexion and abduction. Overall, the pt is progressing  well and is increasing functional level appropriately. There are still defiicts that will continue to be addressed with skilled PT.    Personal Factors and Comorbidities Comorbidity 1    Comorbidities PMH:Rt RTC repain 02/09/21, Rt THA, insomnia,    Examination-Activity Limitations Lift;Carry;Reach Overhead;Sleep    Examination-Participation Restrictions Cleaning;Driving;Yard Work;Occupation;Other    Stability/Clinical Decision Making Stable/Uncomplicated    PT Frequency 2x / week    PT Duration 8 weeks    PT Treatment/Interventions ADLs/Self Care Home Management;Cryotherapy;Electrical Stimulation;Iontophoresis 4mg /ml Dexamethasone;Moist Heat;Ultrasound;Therapeutic activities;Therapeutic exercise;Neuromuscular re-education;Patient/family education;Manual techniques;Passive range of motion;Dry needling;Joint Manipulations;Vasopneumatic Device;Taping    PT Next Visit Plan Informative overview of using gym equipment, Progress overhead strength exercises    PT Home Exercise Plan Access Code: [email or web address]    Consulted and Agree with Plan of Care Patient             Patient will benefit from skilled therapeutic intervention in order to improve the following deficits and impairments:  Decreased activity tolerance, Decreased range of motion, Decreased strength, Increased edema, Impaired UE functional use, Pain  Visit Diagnosis: Acute pain of right shoulder  Muscle weakness (generalized)  Localized edema     Problem List Patient Active Problem List   Diagnosis Date Noted   COVID-19 01/05/2021   Erectile dysfunction 08/30/2020   Acute pansinusitis 02/27/2018   Unilateral primary osteoarthritis, right hip 05/03/2017   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   Tinnitus 10/12/2016   Unilateral primary osteoarthritis, left hip 03/16/2016   Status post left hip replacement 03/16/2016   Skin lesion of left leg 10/07/2015   Otitis, externa, infective 03/31/2015   Right hip pain  08/11/2014   Colon polyp 08/11/2014   Insomnia 01/09/2014   Hyperglycemia 01/09/2014   Eczema 01/09/2014   Enlarged prostate 01/30/2013   Esophageal stricture 01/30/2013   Esophageal ulcer 01/30/2013   Overweight 11/14/2012   Esophageal reflux 05/21/2012   Hyperlipidemia 01/09/2012   Preventative health care 09/29/2011   Epicondylitis elbow, medial 04/20/2011   HTN (hypertension) 03/27/2011   Achalasia 19/37/9024    Shaira Sova Singer, Student-PT 05/11/2021, [facility] [address] [place], [place], [address] Phone: [phone]   Fax:  [phone]  Name: Gabriel [name] MRN: [id number] Date of Birth: 1951/02/06

## 2021-05-13 ENCOUNTER — Other Ambulatory Visit: Payer: Self-pay

## 2021-05-13 ENCOUNTER — Encounter: Payer: Self-pay | Admitting: Physical Therapy

## 2021-05-13 ENCOUNTER — Ambulatory Visit: Payer: Medicare Other | Admitting: Physical Therapy

## 2021-05-13 DIAGNOSIS — R600 Localized edema: Secondary | ICD-10-CM

## 2021-05-13 DIAGNOSIS — M6281 Muscle weakness (generalized): Secondary | ICD-10-CM | POA: Diagnosis not present

## 2021-05-13 DIAGNOSIS — M25511 Pain in right shoulder: Secondary | ICD-10-CM

## 2021-05-13 NOTE — Therapy (Signed)
[facility] Physical Therapy [address] [place], [place], [address] Phone: [phone]   Fax:  [phone]  Physical Therapy Treatment  Patient Details  Name: Gabriel [name] MRN: [id number] Date of Birth: 1950-07-19 Referring Provider (PT): Marlou Sa, Tonna Corner, MD,   Encounter Date: 05/13/2021   PT End of Session - 05/13/21 0852     Visit Number 11    Number of Visits 15    Date for PT Re-Evaluation 05/20/21    Authorization Type UHC MCR    PT Start Time 0800    PT Stop Time 0845    PT Time Calculation (min) 45 min    Activity Tolerance Patient tolerated treatment well    Behavior During Therapy [facility] for tasks assessed/performed             Past Medical History:  Diagnosis Date   Achalasia    Arthritis    Colon cancer screening 08/11/2014   Sees [facility], Dr Carlean Purl    Colon polyp 08/11/2014   Sees [facility], Dr Carlean Purl     Eczema 01/09/2014   Esophageal reflux 05/21/2012   Hyperlipidemia    Hypertension    Insomnia 01/09/2014   Left hip pain 08/11/2014   OA (osteoarthritis) of hip    right   Otitis, externa, infective 03/31/2015   Overweight 11/14/2012   Right hip pain 08/11/2014   Sees chiropractor, Dr Clovis Riley in [place]    Skin lesion of left leg 10/07/2015   Tinnitus 10/12/2016   Ulcer    esophageal ulcer hx   Wears contact lenses     Past Surgical History:  Procedure Laterality Date   COLONOSCOPY     ESOPHAGOGASTRODUODENOSCOPY     HELLER MYOTOMY  1999   help with swallowing   TONSILLECTOMY     TONSILLECTOMY AND [place] Left 03/16/2016   Procedure: LEFT TOTAL HIP ARTHROPLASTY ANTERIOR APPROACH;  Surgeon: Mcarthur Rossetti, MD;  Location: [place];  Service: Orthopedics;  Laterality: Left;   TOTAL HIP ARTHROPLASTY Right 05/03/2017   Procedure: RIGHT TOTAL HIP ARTHROPLASTY ANTERIOR APPROACH;  Surgeon: Mcarthur Rossetti, MD;  Location: [place];  Service: Orthopedics;  Laterality: Right;   UPPER GASTROINTESTINAL  ENDOSCOPY      There were no vitals filed for this visit.   Subjective Assessment - 05/13/21 0757     Subjective Pt. states that his shoulders are feeling sore today, because he was working yesterday. His work required 12 hours of driving and this was the first time he has driven since the surgery. He states he was compliant with his HEP, to relieve soreness.    Pain Onset More than a month ago                               [facility] Adult PT Treatment/Exercise - 05/13/21 0001       Shoulder Exercises: Seated   Other Seated Exercises Lat. pull down machine 2 x 10 25#    Other Seated Exercises Chest press machine 2x 10, 20#      Shoulder Exercises: Standing   External Rotation Right;20 reps;Weights;Strengthening    External Rotation Weight (lbs) 5#, cable column    Internal Rotation Right;20 reps;Strengthening    Internal Rotation Weight (lbs) 5# cable column    Extension Both;20 reps    Extension Weight (lbs) 10# cable column    Row Both;20 reps;Weights    Row Weight (lbs) 20#  cable column    Other Standing Exercises Bicep curl supinated at cable column 2 x 10 10#, Overhead press up 3# dumbbell 2 x 10    Other Standing Exercises UE ranger abduction and flexion stretch R UE only x 10; Red band tied at wrist, walking up wall 3 x 30sec.      Shoulder Exercises: ROM/Strengthening   UBE (Upper Arm Bike) Lvl 5.0 3 mins fwd/back each way                     PT Education - 05/13/21 0906     Education Details Pt. educated on using gym equipment for [employer]) Educated Patient    Methods Explanation;Demonstration;Verbal cues    Comprehension Verbalized understanding;Returned demonstration;Need further instruction              PT Short Term Goals - 04/27/21 0930       PT SHORT TERM GOAL #1   Title Pt will be I and compliant with HEP    Baseline encouraged more stretching    Time 4    Period Weeks    Status On-going    Target Date  04/22/21      PT SHORT TERM GOAL #2   Title He will improve Rt shoulder PROM to [facility]    Baseline mild limitations    Time 4    Period Weeks    Status On-going               PT Long Term Goals - 05/11/21 0902       PT LONG TERM GOAL #1   Title Pt will improve FOTO functional score to 69%    Time 8    Period Weeks    Status Achieved    Target Date 05/20/21      PT LONG TERM GOAL #2   Title Pt will improve Rt shoulder AROM to [facility] and PROM to WNL    Time 8    Period Weeks    Status On-going      PT LONG TERM GOAL #3   Title Pt will improve Rt shoulder strength to at least 4+/5 all planes to improve functional lifiting    Time 8    Period Weeks    Status On-going      PT LONG TERM GOAL #4   Title Pt will reduce pain to overall 3 or less out of 10 with usual activity.    Time 8    Period Weeks    Status Achieved      PT LONG TERM GOAL #5   Title He will be able to return to light golf activity drills, chipping and putting activity without difficulty    Time 8    Period Weeks    Status On-going                   Plan - 05/13/21 0853     Clinical Impression Statement Session focused on an overview of gym equipment that the pt. can use at the [facility], to assist with his shoulder recovery.We discussed and pt. returned demonstration of how to use each machine and the amount of weights and reps that should be used during the exercise. This will help the pt. after discharge from PT to be independent with exercises. Pt. is showing strength and ROM progression with the R shoulder.    Personal Factors and Comorbidities Comorbidity 1    Comorbidities PMH:Rt RTC  repain 02/09/21, Rt THA, insomnia,    Examination-Activity Limitations Lift;Carry;Reach Overhead;Sleep    Examination-Participation Restrictions Cleaning;Driving;Yard Work;Occupation;Other    Stability/Clinical Decision Making Stable/Uncomplicated    PT Frequency 2x / week    PT Duration 8 weeks    PT  Treatment/Interventions ADLs/Self Care Home Management;Cryotherapy;Electrical Stimulation;Iontophoresis 4mg /ml Dexamethasone;Moist Heat;Ultrasound;Therapeutic activities;Therapeutic exercise;Neuromuscular re-education;Patient/family education;Manual techniques;Passive range of motion;Dry needling;Joint Manipulations;Vasopneumatic Device;Taping    PT Next Visit Plan Practice with golf swings, Progress overhead strength and ROM exercises    PT Home Exercise Plan Access Code: [email or web address]    Consulted and Agree with Plan of Care Patient             Patient will benefit from skilled therapeutic intervention in order to improve the following deficits and impairments:  Decreased activity tolerance, Decreased range of motion, Decreased strength, Increased edema, Impaired UE functional use, Pain  Visit Diagnosis: Acute pain of right shoulder  Localized edema  Muscle weakness (generalized)     Problem List Patient Active Problem List   Diagnosis Date Noted   COVID-19 01/05/2021   Erectile dysfunction 08/30/2020   Acute pansinusitis 02/27/2018   Unilateral primary osteoarthritis, right hip 05/03/2017   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   Tinnitus 10/12/2016   Unilateral primary osteoarthritis, left hip 03/16/2016   Status post left hip replacement 03/16/2016   Skin lesion of left leg 10/07/2015   Otitis, externa, infective 03/31/2015   Right hip pain 08/11/2014   Colon polyp 08/11/2014   Insomnia 01/09/2014   Hyperglycemia 01/09/2014   Eczema 01/09/2014   Enlarged prostate 01/30/2013   Esophageal stricture 01/30/2013   Esophageal ulcer 01/30/2013   Overweight 11/14/2012   Esophageal reflux 05/21/2012   Hyperlipidemia 01/09/2012   Preventative health care 09/29/2011   Epicondylitis elbow, medial 04/20/2011   HTN (hypertension) 03/27/2011   Achalasia 82/80/0349    Aadan Chenier Singer, Student-PT 05/13/2021, 9:20 AM  [facility] Physical Therapy [address] [place], [place], [address] Phone: [phone]   Fax:  [phone]  Name: Matty Vanroekel MRN: [id number] Date of Birth: December 29, 1950

## 2021-05-18 ENCOUNTER — Encounter: Payer: Medicare Other | Admitting: Physical Therapy

## 2021-05-20 ENCOUNTER — Ambulatory Visit: Payer: Medicare Other | Admitting: Physical Therapy

## 2021-05-20 ENCOUNTER — Encounter: Payer: Self-pay | Admitting: Physical Therapy

## 2021-05-20 ENCOUNTER — Other Ambulatory Visit: Payer: Self-pay

## 2021-05-20 ENCOUNTER — Encounter: Payer: Self-pay | Admitting: Orthopedic Surgery

## 2021-05-20 ENCOUNTER — Ambulatory Visit (INDEPENDENT_AMBULATORY_CARE_PROVIDER_SITE_OTHER): Payer: Medicare Other | Admitting: Orthopedic Surgery

## 2021-05-20 DIAGNOSIS — M25511 Pain in right shoulder: Secondary | ICD-10-CM

## 2021-05-20 DIAGNOSIS — M6281 Muscle weakness (generalized): Secondary | ICD-10-CM

## 2021-05-20 DIAGNOSIS — R600 Localized edema: Secondary | ICD-10-CM | POA: Diagnosis not present

## 2021-05-20 DIAGNOSIS — M75121 Complete rotator cuff tear or rupture of right shoulder, not specified as traumatic: Secondary | ICD-10-CM

## 2021-05-20 NOTE — Therapy (Signed)
[facility] Physical Therapy [address] [place], [place], [address] Phone: [phone]   Fax:  [phone]  Physical Therapy Treatment/ Discharge  PHYSICAL THERAPY DISCHARGE SUMMARY  Visits from Start of Care: 12  Current functional level related to goals / functional outcomes: See below   Remaining deficits: See below   Education / Equipment: See below Plan: Patient agrees to discharge.  Patient goals were met. Patient is being discharged due to meeting the stated rehab goals.        Patient Details  Name: Gabriel [name] MRN: [id number] Date of Birth: [birth year] Referring Provider (PT): Marlou Sa Tonna Corner, MD,   Encounter Date: 05/20/2021   PT End of Session - 05/20/21 0938     Visit Number 12    Number of Visits 15    Date for PT Re-Evaluation 05/20/21    Authorization Type UHC MCR    PT Start Time 0800    PT Stop Time 0845    PT Time Calculation (min) 45 min    Activity Tolerance Patient tolerated treatment well    Behavior During Therapy [facility] for tasks assessed/performed             Past Medical History:  Diagnosis Date   Achalasia    Arthritis    Colon cancer screening 08/11/2014   Sees [facility], Dr Carlean Purl    Colon polyp 08/11/2014   Sees [facility], Dr Carlean Purl     Eczema 01/09/2014   Esophageal reflux 05/21/2012   Hyperlipidemia    Hypertension    Insomnia 01/09/2014   Left hip pain 08/11/2014   OA (osteoarthritis) of hip    right   Otitis, externa, infective 03/31/2015   Overweight 11/14/2012   Right hip pain 08/11/2014   Sees chiropractor, Dr Clovis Riley in [place]    Skin lesion of left leg 10/07/2015   Tinnitus 10/12/2016   Ulcer    esophageal ulcer hx   Wears contact lenses     Past Surgical History:  Procedure Laterality Date   COLONOSCOPY     ESOPHAGOGASTRODUODENOSCOPY     HELLER MYOTOMY  1999   help with swallowing   TONSILLECTOMY     TONSILLECTOMY AND ADENOIDECTOMY  1963   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   Procedure: LEFT  TOTAL HIP ARTHROPLASTY ANTERIOR APPROACH;  Surgeon: Mcarthur Rossetti, MD;  Location: [place];  Service: Orthopedics;  Laterality: Left;   TOTAL HIP ARTHROPLASTY Right 05/03/2017   Procedure: RIGHT TOTAL HIP ARTHROPLASTY ANTERIOR APPROACH;  Surgeon: Mcarthur Rossetti, MD;  Location: [place];  Service: Orthopedics;  Laterality: Right;   UPPER GASTROINTESTINAL ENDOSCOPY      There were no vitals filed for this visit.   Subjective Assessment - 05/20/21 0805     Subjective Pt. states that his shoulder is feeling great today. 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 and he was able to go to the [facility] and they have all of the equipment he needs to maintain his HEP    Pain Onset More than a month ago                [facility] PT Assessment - 05/20/21 0001       Assessment   Medical Diagnosis Rt shoulder RTC repair 02/09/21    Referring Provider (PT) Marlou Sa Tonna Corner, MD,    Onset Date/Surgical Date 02/09/22    Next MD Visit 05/20/2021      Observation/Other Assessments   Focus on Therapeutic Outcomes (FOTO)  76%  AROM   Right Shoulder Flexion 145 Degrees    Right Shoulder ABduction 138 Degrees    Right Shoulder Internal Rotation 45 Degrees    Right Shoulder External Rotation 55 Degrees      PROM   Right Shoulder Flexion 150 Degrees    Right Shoulder ABduction 140 Degrees    Right Shoulder Internal Rotation 50 Degrees    Right Shoulder External Rotation 56 Degrees      Strength   Right Shoulder Flexion 4/5    Right Shoulder Extension 4+/5    Right Shoulder ABduction 4/5    Right Shoulder Internal Rotation 5/5    Right Shoulder External Rotation 5/5                           [facility] Adult PT Treatment/Exercise - 05/20/21 0001       Shoulder Exercises: Standing   Other Standing Exercises Practice golf chipping and mid-swing 2 x 7 swings      Shoulder Exercises: ROM/Strengthening   UBE (Upper Arm Bike) Lvl 5.0 3 mins fwd/back each  way                       PT Short Term Goals - 05/20/21 0948       PT SHORT TERM GOAL #1   Title Pt will be I and compliant with HEP    Baseline encouraged more stretching    Time 4    Period Weeks    Status Achieved    Target Date 04/22/21      PT SHORT TERM GOAL #2   Title He will improve Rt shoulder PROM to [facility]    Baseline mild limitations    Time 4    Period Weeks    Status Achieved               PT Long Term Goals - 05/20/21 6384       PT LONG TERM GOAL #1   Title Pt will improve FOTO functional score to 69%    Time 8    Period Weeks    Status Achieved    Target Date 05/20/21      PT LONG TERM GOAL #2   Title Pt will improve Rt shoulder AROM to [facility] and PROM to WNL    Time 8    Period Weeks    Status Achieved      PT LONG TERM GOAL #3   Title Pt will improve Rt shoulder strength to at least 4+/5 all planes to improve functional lifiting    Time 8    Period Weeks    Status Achieved      PT LONG TERM GOAL #4   Title Pt will reduce pain to overall 3 or less out of 10 with usual activity.    Time 8    Period Weeks    Status Achieved      PT LONG TERM GOAL #5   Title He will be able to return to light golf activity drills, chipping and putting activity without difficulty    Time 8    Period Weeks    Status Achieved                   Plan - 05/20/21 0940     Clinical Impression Statement Session consisted of reassessing pt's R shoulder measurements as well as, long-term goals including R shoulder ROM, strength, pain and FOTO  goals. Pt. still shows slight limitations in R shoulder overhead ROM and strength. Pt.'s HEP was updated today, to prioritize these mild remaining deficits. PT and pt. believe continued progression can be made with independent HEP plan. Pt. will be discharged from physical therapy, following MD visit this afternoon. We also discussed gradual return to golf and had him perform simulated golf swings today  without pain or difficulty noted. He had no further questions or concerns regaurding DC.    Personal Factors and Comorbidities Comorbidity 1    Comorbidities PMH:Rt RTC repain 02/09/21, Rt THA, insomnia,    Examination-Activity Limitations Lift;Carry;Reach Overhead;Sleep    Examination-Participation Restrictions Cleaning;Driving;Yard Work;Occupation;Other    Stability/Clinical Decision Making Stable/Uncomplicated    PT Frequency 2x / week    PT Duration 8 weeks    PT Treatment/Interventions ADLs/Self Care Home Management;Cryotherapy;Electrical Stimulation;Iontophoresis 90m/ml Dexamethasone;Moist Heat;Ultrasound;Therapeutic activities;Therapeutic exercise;Neuromuscular re-education;Patient/family education;Manual techniques;Passive range of motion;Dry needling;Joint Manipulations;Vasopneumatic Device;Taping    PT Next Visit Plan DC from PT today    PT Home Exercise Plan Access Code: [email or web address]   Consulted and Agree with Plan of Care Patient             Patient will benefit from skilled therapeutic intervention in order to improve the following deficits and impairments:  Decreased activity tolerance, Decreased range of motion, Decreased strength, Increased edema, Impaired UE functional use, Pain  Visit Diagnosis: Acute pain of right shoulder  Localized edema  Muscle weakness (generalized)     Problem List Patient Active Problem List   Diagnosis Date Noted   COVID-19 01/05/2021   Erectile dysfunction 08/30/2020   Acute pansinusitis 02/27/2018   Unilateral primary osteoarthritis, right hip 05/03/2017   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   Tinnitus 10/12/2016   Unilateral primary osteoarthritis, left hip 03/16/2016   Status post left hip replacement 03/16/2016   Skin lesion of left leg 10/07/2015   Otitis, externa, infective 03/31/2015   Right hip pain 08/11/2014   Colon polyp 08/11/2014   Insomnia 01/09/2014   Hyperglycemia 01/09/2014   Eczema 01/09/2014    Enlarged prostate 01/30/2013   Esophageal stricture 01/30/2013   Esophageal ulcer 01/30/2013   Overweight 11/14/2012   Esophageal reflux 05/21/2012   Hyperlipidemia 01/09/2012   Preventative health care 09/29/2011   Epicondylitis elbow, medial 04/20/2011   HTN (hypertension) 03/27/2011   Achalasia 137/36/6815   AWilson Singer Student-PT 05/20/2021, 11:26 AM  [facility] [address] [address]Phone: [phone]  Fax:  [phone] Name: RRamez ArronaMRN: [id number] of Birth: [birth year]

## 2021-05-20 NOTE — Progress Notes (Signed)
Post-Op Visit Note   Patient: Gabriel [name]           Date of Birth: 1950-08-24           MRN: [id number] Visit Date: 05/20/2021 PCP: Mosie Lukes, MD   Assessment & Plan:  Chief Complaint:  Chief Complaint  Patient presents with   Right Shoulder - Follow-up   Visit Diagnoses:  1. Nontraumatic complete tear of right rotator cuff     Plan: Gabriel [name] is a 70 year old patient with right shoulder rotator cuff tear repair done 02/09/2021.  He states "I am doing great".  [facility]  Would like to return to golf.  On examination he has range of motion on the right passively of 45/85/155.  Rotator cuff strength is good with no coarseness or grinding with internal/external rotation of the shoulder at 15 or 90 degrees of abduction.  Rotator cuff strength is good as well to infraspinatus and subscap strength testing.  Plan at this time is gradual return to golf.  Do not really want him doing full golf swings for about at least 3 to 4 weeks and he needs to gradually build up to swinging the golf club over the next month before spring season.  Follow-Up Instructions: Return if symptoms worsen or fail to improve.   Orders:  No orders of the defined types were placed in this encounter.  No orders of the defined types were placed in this encounter.   Imaging: No results found.  PMFS History: Patient Active Problem List   Diagnosis Date Noted   COVID-19 01/05/2021   Erectile dysfunction 08/30/2020   Acute pansinusitis 02/27/2018   Unilateral primary osteoarthritis, right hip 05/03/2017   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   Tinnitus 10/12/2016   Unilateral primary osteoarthritis, left hip 03/16/2016   Status post left hip replacement 03/16/2016   Skin lesion of left leg 10/07/2015   Otitis, externa, infective 03/31/2015   Right hip pain 08/11/2014   Colon polyp 08/11/2014   Insomnia 01/09/2014   Hyperglycemia 01/09/2014    Eczema 01/09/2014   Enlarged prostate 01/30/2013   Esophageal stricture 01/30/2013   Esophageal ulcer 01/30/2013   Overweight 11/14/2012   Esophageal reflux 05/21/2012   Hyperlipidemia 01/09/2012   Preventative health care 09/29/2011   Epicondylitis elbow, medial 04/20/2011   HTN (hypertension) 03/27/2011   Achalasia 03/27/2011   Past Medical History:  Diagnosis Date   Achalasia    Arthritis    Colon cancer screening 08/11/2014   Sees [facility], Dr Carlean Purl    Colon polyp 08/11/2014   Sees [facility], Dr Carlean Purl     Eczema 01/09/2014   Esophageal reflux 05/21/2012   Hyperlipidemia    Hypertension    Insomnia 01/09/2014   Left hip pain 08/11/2014   OA (osteoarthritis) of hip    right   Otitis, externa, infective 03/31/2015   Overweight 11/14/2012   Right hip pain 08/11/2014   Sees chiropractor, Dr Clovis Riley in [place]    Skin lesion of left leg 10/07/2015   Tinnitus 10/12/2016   Ulcer    esophageal ulcer hx   Wears contact lenses     Family History  Problem Relation Age of Onset   Alcohol abuse Mother    Ovarian cancer Mother    Diabetes Maternal Grandmother    Alcohol abuse Father    Lung cancer Father    Mental illness Sister    Depression Sister    Heart  disease Brother        congenital heart   Arthritis Sister    Hypertension Sister    Diverticulosis Sister    Healthy Sister    Healthy Sister    Healthy Sister    Colon cancer Neg Hx    Esophageal cancer Neg Hx    Rectal cancer Neg Hx    Stomach cancer Neg Hx     Past Surgical History:  Procedure Laterality Date   COLONOSCOPY     ESOPHAGOGASTRODUODENOSCOPY     HELLER MYOTOMY  1999   help with swallowing   TONSILLECTOMY     TONSILLECTOMY AND ADENOIDECTOMY  1963   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   Procedure: LEFT TOTAL HIP ARTHROPLASTY ANTERIOR APPROACH;  Surgeon: Mcarthur Rossetti, MD;  Location: [place];  Service: Orthopedics;  Laterality: Left;   TOTAL HIP ARTHROPLASTY Right 05/03/2017   Procedure: RIGHT TOTAL HIP  ARTHROPLASTY ANTERIOR APPROACH;  Surgeon: Mcarthur Rossetti, MD;  Location: [place];  Service: Orthopedics;  Laterality: Right;   UPPER GASTROINTESTINAL ENDOSCOPY     Social History   Occupational History   Occupation: sales/distribution    Employer: [employer]  Tobacco Use   Smoking status: Former    Types: Cigarettes    Quit date: 04/13/1991    Years since quitting: 30.1   Smokeless tobacco: Never   Tobacco comments:    1 ppd for 25 years quit 93'  Vaping Use   Vaping Use: Never used  Substance and Sexual Activity   Alcohol use: Yes    Alcohol/week: 7.0 standard drinks    Types: 7 Glasses of wine per week    Comment: Wine nightly   Drug use: No   Sexual activity: Not on file

## 2021-05-22 ENCOUNTER — Encounter: Payer: Medicare Other | Admitting: Physical Therapy

## 2021-05-26 DIAGNOSIS — D485 Neoplasm of uncertain behavior of skin: Secondary | ICD-10-CM | POA: Diagnosis not present

## 2021-05-26 DIAGNOSIS — L988 Other specified disorders of the skin and subcutaneous tissue: Secondary | ICD-10-CM | POA: Diagnosis not present

## 2021-07-27 ENCOUNTER — Ambulatory Visit (INDEPENDENT_AMBULATORY_CARE_PROVIDER_SITE_OTHER): Payer: Medicare Other | Admitting: Family Medicine

## 2021-07-27 ENCOUNTER — Encounter: Payer: Self-pay | Admitting: Family Medicine

## 2021-07-27 ENCOUNTER — Encounter: Payer: Medicare Other | Admitting: Family Medicine

## 2021-07-27 VITALS — BP 139/82 | HR 100 | Ht 66.0 in | Wt 208.6 lb

## 2021-07-27 DIAGNOSIS — N529 Male erectile dysfunction, unspecified: Secondary | ICD-10-CM

## 2021-07-27 DIAGNOSIS — B351 Tinea unguium: Secondary | ICD-10-CM

## 2021-07-27 DIAGNOSIS — Z0001 Encounter for general adult medical examination with abnormal findings: Secondary | ICD-10-CM | POA: Diagnosis not present

## 2021-07-27 DIAGNOSIS — R7303 Prediabetes: Secondary | ICD-10-CM

## 2021-07-27 DIAGNOSIS — Z125 Encounter for screening for malignant neoplasm of prostate: Secondary | ICD-10-CM | POA: Diagnosis not present

## 2021-07-27 DIAGNOSIS — R972 Elevated prostate specific antigen [PSA]: Secondary | ICD-10-CM

## 2021-07-27 LAB — LIPID PANEL
Cholesterol: 195 mg/dL (ref 0–200)
HDL: 58.7 mg/dL (ref >39.00)
LDL Cholesterol: 111 mg/dL — ABNORMAL HIGH (ref 0–99)
NonHDL: 136.32
Total CHOL/HDL Ratio: 3
Triglycerides: 127 mg/dL (ref 0.0–149.0)
VLDL: 25.4 mg/dL (ref 0.0–40.0)

## 2021-07-27 LAB — COMPREHENSIVE METABOLIC PANEL
ALT: 37 U/L (ref 0–53)
AST: 23 U/L (ref 0–37)
Albumin: 4.5 g/dL (ref 3.5–5.2)
Alkaline Phosphatase: 53 U/L (ref 39–117)
BUN: 13 mg/dL (ref 6–23)
CO2: 31 mEq/L (ref 19–32)
Calcium: 9.4 mg/dL (ref 8.4–10.5)
Chloride: 94 mEq/L — ABNORMAL LOW (ref 96–112)
Creatinine, Ser: 0.91 mg/dL (ref 0.40–1.50)
GFR: 85.95 mL/min (ref >60.00)
Glucose, Bld: 108 mg/dL — ABNORMAL HIGH (ref 70–99)
Potassium: 4.3 mEq/L (ref 3.5–5.1)
Sodium: 132 mEq/L — ABNORMAL LOW (ref 135–145)
Total Bilirubin: 0.7 mg/dL (ref 0.2–1.2)
Total Protein: 6.8 g/dL (ref 6.0–8.3)

## 2021-07-27 LAB — CBC
HCT: 44.9 % (ref 39.0–52.0)
Hemoglobin: 15 g/dL (ref 13.0–17.0)
MCHC: 33.4 g/dL (ref 30.0–36.0)
MCV: 93.8 fl (ref 78.0–100.0)
Platelets: 272 10*3/uL (ref 150.0–400.0)
RBC: 4.79 Mil/uL (ref 4.22–5.81)
RDW: 12.8 % (ref 11.5–15.5)
WBC: 6.1 10*3/uL (ref 4.0–10.5)

## 2021-07-27 LAB — TSH: TSH: 1.02 u[IU]/mL (ref 0.35–5.50)

## 2021-07-27 LAB — HEMOGLOBIN A1C: Hgb A1c MFr Bld: 6.6 % — ABNORMAL HIGH (ref 4.6–6.5)

## 2021-07-27 LAB — PSA: PSA: 4.15 ng/mL — ABNORMAL HIGH (ref 0.10–4.00)

## 2021-07-27 MED ORDER — SILDENAFIL CITRATE 20 MG PO TABS: 20.0000 mg | ORAL_TABLET | Freq: Every day | ORAL | 2 refills | Status: DC | PRN

## 2021-07-27 NOTE — Progress Notes (Signed)
? ?BP 139/82   Pulse 100   Ht '5\' 6"'$  (1.676 m)   Wt 208 lb 9.6 oz (94.6 kg)   BMI 33.67 kg/m?   ? ?Subjective:  ? ? Patient ID: Gabriel [name], male    DOB: 1950-06-11, 71 y.o.   MRN: [id number] ? ?HPI: ?Gabriel [name] is a 71 y.o. male presenting on 07/27/2021 for comprehensive medical examination. Current medical complaints include: toe fungus: ? ?L great toe onychomycosis x1 year- extremely thick, hard, yellow, will occasionally soak and use vitamin E oil, but nothing is working, keeps getting worse. Uncomfortable at times, but currently no pain, erythema, drainage, etc.  ? ?He currently lives with: wife ?Interim Problems from his last visit: no ? ?He reports regular vision exams q1-5y: yes ?He reports regular dental exams q 21m yes ?His diet consists of: regular, trying to do low-carb ?He endorses exercise and/or activity of: just [employer]  ?He works at: p[employer] worker  ? ?He endorses ETOH use - 7 servings per week  ?He denies nictoine use  ?He denies illegal substance use  ? ?He is currently sexually active  ?He denies concerns today about STI ? ?He denies concerns about skin changes today:  ?He denies concerns about bowel changes today:  ?He denies concerns about bladder changes today:  ? ?Depression Screen done today and results listed below:  ? ?  07/27/2021  ?  1:18 PM 12/08/2020  ?  9:06 AM 08/28/2020  ?  3:46 PM 12/19/2017  ?  7:46 AM 10/12/2016  ?  4:13 PM  ?Depression screen PHQ 2/9  ?Decreased Interest 0 0 0 0 0  ?Down, Depressed, Hopeless 0 0 0 0 0  ?PHQ - 2 Score 0 0 0 0 0  ? ? ?The patient does not have a history of falls.  ? ? ? ? ?Past Medical History:  ?Past Medical History:  ?Diagnosis Date  ? Achalasia   ? Arthritis   ? Colon cancer screening 08/11/2014  ? Sees [facility], Dr GCarlean Purl  ? Colon polyp 08/11/2014  ? Sees [facility], Dr GCarlean Purl   ? Eczema 01/09/2014  ? Esophageal reflux 05/21/2012  ? Hyperlipidemia   ? Hypertension   ? Insomnia 01/09/2014  ? Left hip pain 08/11/2014  ? OA (osteoarthritis)  of hip   ? right  ? Otitis, externa, infective 03/31/2015  ? Overweight 11/14/2012  ? Right hip pain 08/11/2014  ? Se[place] chiropractor, [place]ovis [place]  ? Skin lesion of left leg 10/07/2015  ? Tinnitus 10/12/2016  ? Ulcer   ? esophageal ulcer hx  ? Wears contact lenses   ? ? ?Surgical History:  ?Past Surgical History:  ?Procedure Laterality Date  ? COLONOSCOPY    ? ESOPHAGOGASTRODUODENOSCOPY    ? [facility] Echo ? help with swallowing  ? T[place]OMY    ? [place] 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  ? Procedure: LEFT TOTAL HIP ARTHROPLASTY ANTERIOR APPROACH;  Surgeon: CMcarthur Rossetti MD;  [facility]  Service: Orthopedics;  Laterality: Left;  ? TOTAL HIP ARTHROPLASTY Right 05/03/2017  ? Procedure: RIGHT TOTAL HIP ARTHROPLASTY ANTERIOR APPROACH;  Surgeon: BMcarthur Rossetti MD;  [facility]  Service: Orthopedics;  Laterality: Right;  ? UPPER GASTROINTESTINAL ENDOSCOPY    ? ? ?Medications:  ?Current Outpatient Medications on File Prior to Visit  ?Medication Sig  ? hydrochlorothiazide (HYDRODIURIL) 12.5 MG tablet TAKE 1 TABLET BY MOUTH EVERY  DAY  ? omeprazole (PRILOSEC) 40 MG capsule TAKE 1 CAPSULE (40 MG TOTAL) BY MOUTH DAILY.  ? enalapril (VASOTEC) 20 MG tablet TAKE 1 TABLET BY MOUTH EVERY DAY  ? methocarbamol (ROBAXIN) 500 MG tablet Take 1 tablet (500 mg total) by mouth every 8 (eight) hours as needed.  ? Multiple Vitamins-Minerals (ANTIOXIDANT VITAMINS) TABS Take 1 tablet by mouth daily. Life Guard  ? mupirocin ointment (BACTROBAN) 2 % Apply to b/l nares qhs x 7 days then as needed  ? pimecrolimus (ELIDEL) 1 % cream Apply 1 application topically daily. (Patient taking differently: Apply 1 application topically 3 (three) times daily as needed (rash).)  ? pyridostigmine (MESTINON) 60 MG/5ML solution Take 2.5 mLs by mouth 3 (three) times daily.  ? Red Yeast Rice Extract 600 MG TABS Take 1 tablet by mouth daily.   ? ?No current facility-administered  medications on file prior to visit.  ? ? ?Allergies:  ?No Known Allergies ? ?Social History:  ?Social History  ? ?Socioeconomic History  ? Marital status: Married  ?  Spouse name: Not on file  ? Number of children: 2  ? Years of education: Not on file  ? Highest education level: Not on file  ?Occupational History  ? Occupation: sales/distribution  ?  Employer: [employer]  ?Tobacco Use  ? Smoking status: Former  ?  Types: Cigarettes  ?  Quit date: 04/13/1991  ?  Years since quitting: 30.3  ? Smokeless tobacco: Never  ? Tobacco comments:  ?  1 ppd for 25 years quit 67'  ?Vaping Use  ? Vaping Use: Never used  ?Substance and Sexual Activity  ? Alcohol use: Yes  ?  Alcohol/week: 7.0 standard drinks  ?  Types: 7 Glasses of wine per week  ?  Comment: Wine nightly  ? Drug use: No  ? Sexual activity: Not on file  ?Other Topics Concern  ? Not on file  ?Social History Narrative  ? Married 2 daughters  ? Distributor for [employer]  ? [place]  ?   ?   ? ?Social Determinants of Health  ? ?Financial Resource Strain: Low Risk   ? Difficulty of Paying Living Expenses: Not hard at all  ?Food Insecurity: No Food Insecurity  ? Worried About Charity fundraiser in the Last Year: Never true  ? Ran Out of Food in the Last Year: Never true  ?Transportation Needs: No Transportation Needs  ? Lack of Transportation (Medical): No  ? Lack of Transportation (Non-Medical): No  ?Physical Activity: Inactive  ? Days of Exercise per Week: 0 days  ? Minutes of Exercise per Session: 0 min  ?Stress: No Stress Concern Present  ? Feeling of Stress : Not at all  ?Social Connections: Socially Integrated  ? Frequency of Communication with Friends and Family: More than three times a week  ? Frequency of Social Gatherings with Friends and Family: More than three times a week  ? Attends Religious Services: More than 4 times per year  ? Active Member of Clubs or Organizations: Yes  ? Attends Archivist Meetings: More than 4  times per year  ? Marital Status: Married  ?Intimate Partner Violence: Not At Risk  ? Fear of Current or Ex-Partner: No  ? Emotionally Abused: No  ? Physically Abused: No  ? Sexually Abused: No  ? ?Social History  ? ?Tobacco Use  ?Smoking Status Former  ? Types: Cigarettes  ? Quit date: 04/13/1991  ? Years since quitting:  30.3  ?Smokeless Tobacco Never  ?Tobacco Comments  ? 1 ppd for 25 years quit 40'  ? ?Social History  ? ?Substance and Sexual Activity  ?Alcohol Use Yes  ? Alcohol/week: 7.0 standard drinks  ? Types: 7 Glasses of wine per week  ? Comment: Wine nightly  ? ? ?Family History:  ?Family History  ?Problem Relation Age of Onset  ? Alcohol abuse Mother   ? Ovarian cancer Mother   ? Diabetes Maternal Grandmother   ? Alcohol abuse Father   ? Lung cancer Father   ? Mental illness Sister   ? Depression Sister   ? Heart disease Brother   ?     congenital heart  ? Arthritis Sister   ? Hypertension Sister   ? Diverticulosis Sister   ? Healthy Sister   ? Healthy Sister   ? Healthy Sister   ? Colon cancer Neg Hx   ? Esophageal cancer Neg Hx   ? Rectal cancer Neg Hx   ? Stomach cancer Neg Hx   ? ? ?Past medical history, surgical history, medications, allergies, family history and social history reviewed with patient today and changes made to appropriate areas of the chart.  ? ?All ROS negative except what is listed above and in the HPI.  ? ?   ?Objective:  ?  ?BP 139/82   Pulse 100   Ht '5\' 6"'$  (1.676 m)   Wt 208 lb 9.6 oz (94.6 kg)   BMI 33.67 kg/m?   ?Wt Readings from Last 3 Encounters:  ?07/27/21 208 lb 9.6 oz (94.6 kg)  ?12/08/20 205 lb (93 kg)  ?08/28/20 205 lb 9.6 oz (93.3 kg)  ?  ?Physical Exam ?Vitals reviewed.  ?Constitutional:   ?   Appearance: Normal appearance. He is obese.  ?HENT:  ?   Head: Normocephalic and atraumatic.  ?   Right Ear: Tympanic membrane normal.  ?   Left Ear: Tympanic membrane normal.  ?   Nose: Nose normal.  ?   Mouth/Throat:  ?   Mouth: Mucous membranes are moist.  ?   Pharynx:  Oropharynx is clear.  ?Eyes:  ?   Extraocular Movements: Extraocular movements intact.  ?   Conjunctiva/sclera: Conjunctivae normal.  ?   Pupils: Pupils are equal, round, and reactive to light.  ?Cardiovascular:  ?   Rate

## 2021-07-28 NOTE — Addendum Note (Signed)
Addended by: Caleen Jobs B on: 07/28/2021 01:42 PM ? ? Modules accepted: Orders ? ?

## 2021-08-11 ENCOUNTER — Ambulatory Visit: Payer: Medicare Other | Admitting: Podiatry

## 2021-08-11 ENCOUNTER — Encounter: Payer: Self-pay | Admitting: Podiatry

## 2021-08-11 DIAGNOSIS — L603 Nail dystrophy: Secondary | ICD-10-CM

## 2021-08-11 DIAGNOSIS — L600 Ingrowing nail: Secondary | ICD-10-CM

## 2021-08-11 DIAGNOSIS — M1990 Unspecified osteoarthritis, unspecified site: Secondary | ICD-10-CM | POA: Insufficient documentation

## 2021-08-11 NOTE — Progress Notes (Signed)
?  Subjective:  ?Patient ID: Gabriel [name], male    DOB: Mar 27, 1951,   MRN: [id number] ? ?Chief Complaint  ?Patient presents with  ? Nail Problem  ?  Hallux left - thick, discolored nail x years, doesn't remember an injury, sometimes tender, uses vitamin E cream on it-helps with the tenderness  ? New Patient (Initial Visit)  ? ? ?71 y.o. male presents for concern of left great toenail thickness darkness and pain for over a year. Relates he uses vitamin E and that helps with the tenderness but is hoping to have something done.  . Denies any other pedal complaints. Denies n/v/f/c.  ? ?Past Medical History:  ?Diagnosis Date  ? Achalasia   ? Arthritis   ? Colon cancer screening 08/11/2014  ? Sees [facility], Dr Carlean Purl   ? Colon polyp 08/11/2014  ? Se[facility], Dr Carlean Purl    ? Eczema 01/09/2014  ? Esophageal reflux 05/21/2012  ? Hyperlipidemia   ? Hypertension   ? Insomnia 01/09/2014  ? Left hip pain 08/11/2014  ? OA (osteoarthritis) of hip   ? right  ? Otitis, externa, infective 03/31/2015  ? Overweight 11/14/2012  ? Right hip pain 08/11/2014  ? Sees chiropractor, Dr Clovis Riley in [place]   ? Skin lesion of left leg 10/07/2015  ? Tinnitus 10/12/2016  ? Ulcer   ? esophageal ulcer hx  ? Wears contact lenses   ? ? ?Objective:  ?Physical Exam: ?Vascular: DP/PT pulses 2/4 bilateral. CFT <3 seconds. Normal hair growth on digits. No edema.  ?Skin. No lacerations or abrasions bilateral feet. Left hallux nail thickened and incurvated.  ?Musculoskeletal: MMT 5/5 bilateral lower extremities in DF, PF, Inversion and Eversion. Deceased ROM in DF of ankle joint.  ?Neurological: Sensation intact to light touch.  ? ?Assessment:  ? ?1. Onychodystrophy   ?2. Ingrown left greater toenail   ? ? ? ?Plan:  ?Patient was evaluated and treated and all questions answered. ?Patient requesting removal of ingrown nail today. Procedure below.  ?Discussed procedure and post procedure care and patient expressed understanding.  ?Will follow-up in 2 weeks for nail check or sooner if  any problems arise.  ? ? ?Procedure:  ?Procedure: total Nail Avulsion of left hallux  nail ?Surgeon: Lorenda Peck, DPM  ?Pre-op Dx: Ingrown toenail without infection ?Post-op: Same  ?Place of Surgery: Office exam room.  ?Indications for surgery: Painful and ingrown toenail.  ? ? ?The patient is requesting removal of nail without chemical matrixectomy. Risks and complications were discussed with the patient for which they understand and written consent was obtained. Under sterile conditions a total of 3 mL of  1% lidocaine plain was infiltrated in a hallux block fashion. Once anesthetized, the skin was prepped in sterile fashion. A tourniquet was then applied. Next the entire left hallux nail was removed and area copiously irrigated. Silvadene was applied. A dry sterile dressing was applied. After application of the dressing the tourniquet was removed and there is found to be an immediate capillary refill time to the digit. The patient tolerated the procedure well without any complications. Post procedure instructions were discussed the patient for which he verbally understood. Follow-up in two weeks for nail check or sooner if any problems are to arise. Discussed signs/symptoms of infection and directed to call the office immediately should any occur or go directly to the emergency room. In the meantime, encouraged to call the office with any questions, concerns, changes symptoms. ? ? ?Lorenda Peck, DPM  ? ? ?

## 2021-08-11 NOTE — Patient Instructions (Addendum)

## 2021-08-28 ENCOUNTER — Ambulatory Visit: Payer: Medicare Other | Admitting: Podiatry

## 2021-09-28 DIAGNOSIS — K219 Gastro-esophageal reflux disease without esophagitis: Secondary | ICD-10-CM | POA: Diagnosis not present

## 2021-09-28 DIAGNOSIS — K2289 Other specified disease of esophagus: Secondary | ICD-10-CM | POA: Diagnosis not present

## 2021-09-28 DIAGNOSIS — R1310 Dysphagia, unspecified: Secondary | ICD-10-CM | POA: Diagnosis not present

## 2021-09-28 DIAGNOSIS — E785 Hyperlipidemia, unspecified: Secondary | ICD-10-CM | POA: Diagnosis not present

## 2021-09-28 DIAGNOSIS — I10 Essential (primary) hypertension: Secondary | ICD-10-CM | POA: Diagnosis not present

## 2021-09-28 DIAGNOSIS — K222 Esophageal obstruction: Secondary | ICD-10-CM | POA: Diagnosis not present

## 2021-09-28 DIAGNOSIS — Z9889 Other specified postprocedural states: Secondary | ICD-10-CM | POA: Diagnosis not present

## 2021-09-28 DIAGNOSIS — K2100 Gastro-esophageal reflux disease with esophagitis, without bleeding: Secondary | ICD-10-CM | POA: Diagnosis not present

## 2021-09-28 DIAGNOSIS — T18128A Food in esophagus causing other injury, initial encounter: Secondary | ICD-10-CM | POA: Diagnosis not present

## 2021-10-14 ENCOUNTER — Other Ambulatory Visit: Payer: Self-pay | Admitting: Family Medicine

## 2021-10-14 DIAGNOSIS — R1310 Dysphagia, unspecified: Secondary | ICD-10-CM

## 2021-10-14 DIAGNOSIS — K222 Esophageal obstruction: Secondary | ICD-10-CM

## 2021-10-14 DIAGNOSIS — K220 Achalasia of cardia: Secondary | ICD-10-CM

## 2021-10-14 DIAGNOSIS — K2100 Gastro-esophageal reflux disease with esophagitis, without bleeding: Secondary | ICD-10-CM

## 2021-11-20 IMAGING — DX DG CHEST 2V
2 series · 2 of 2 positions shown · non-contrast
Comparison: None.

CLINICAL DATA: Cough for several weeks

EXAM:
CHEST - 2 VIEW

[chest pa]
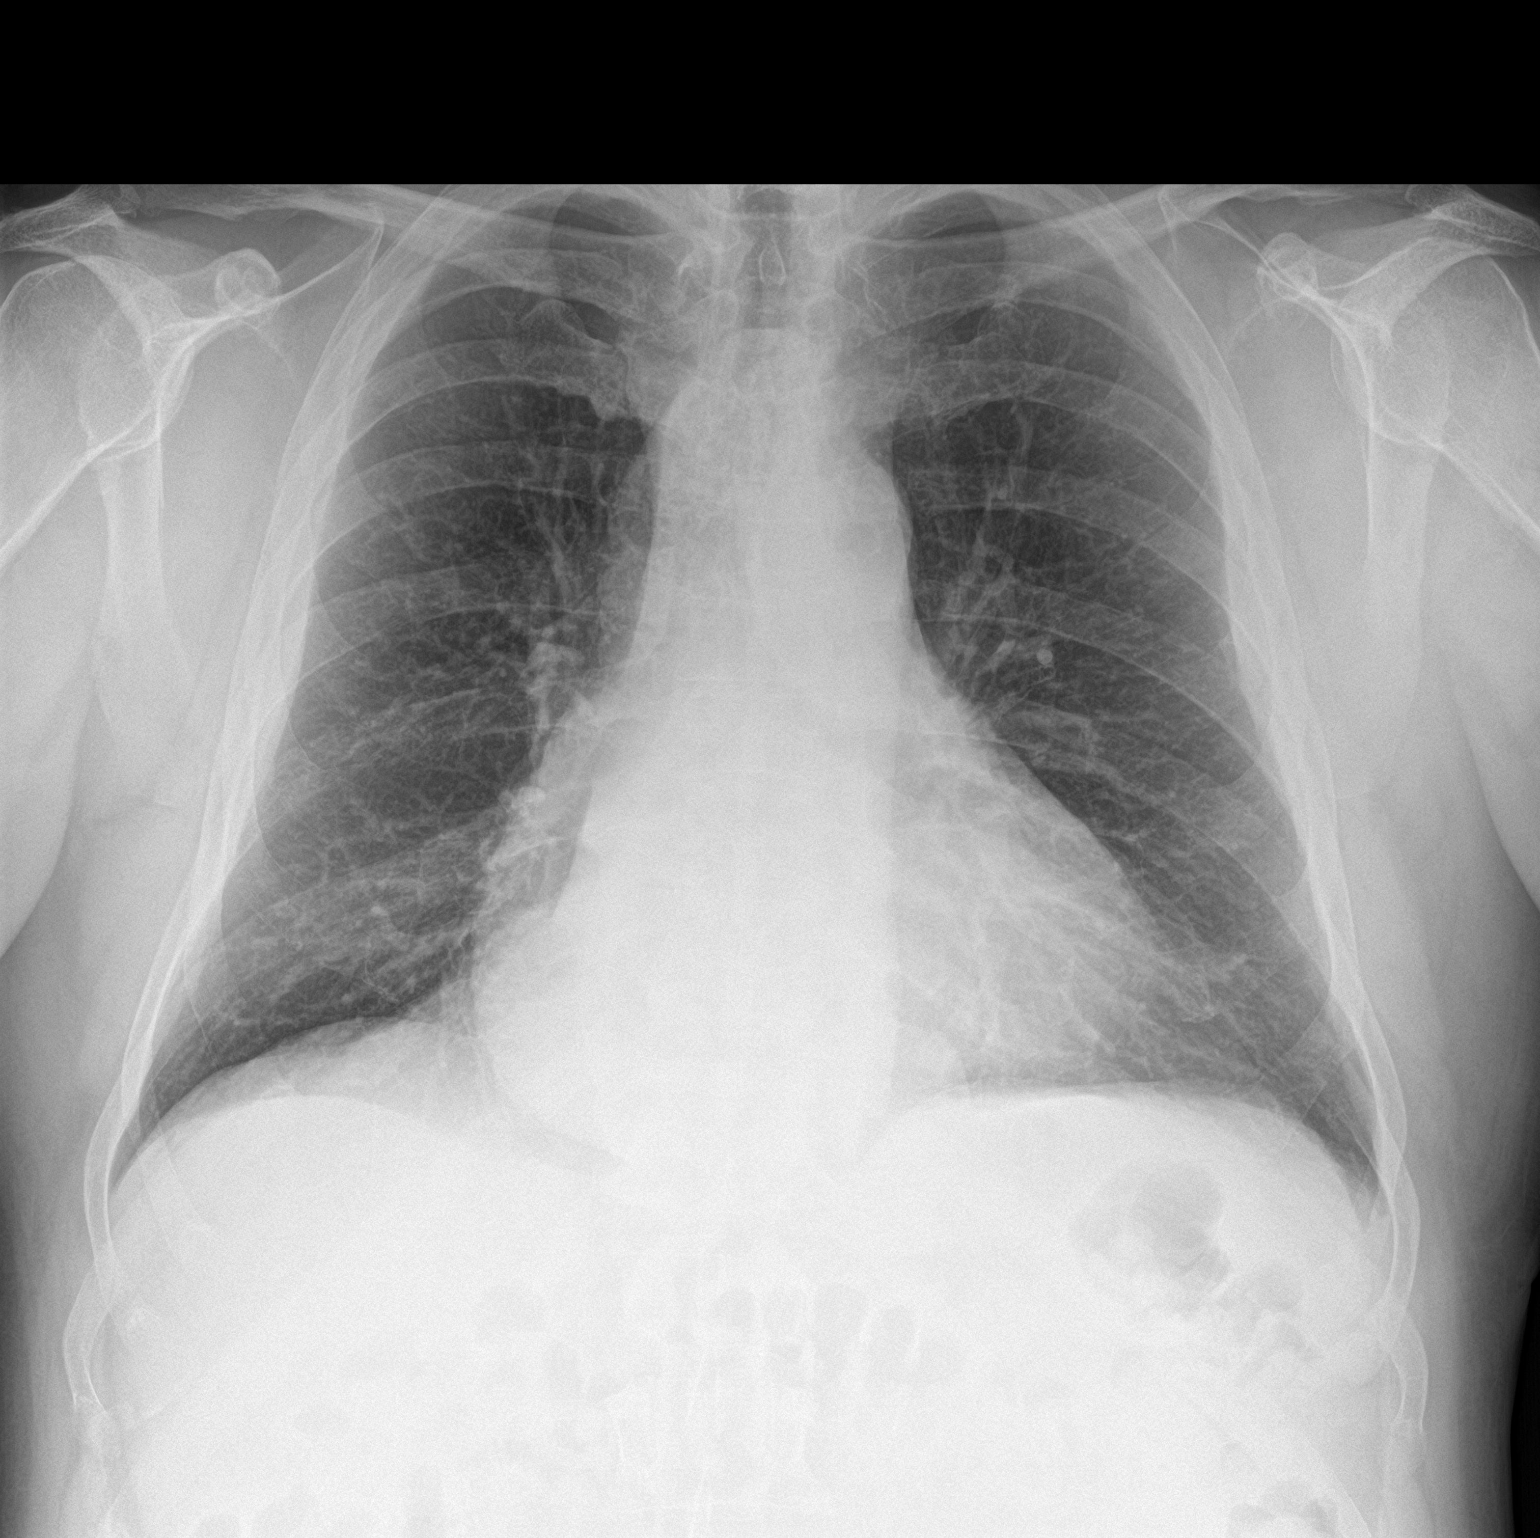

[chest lat]
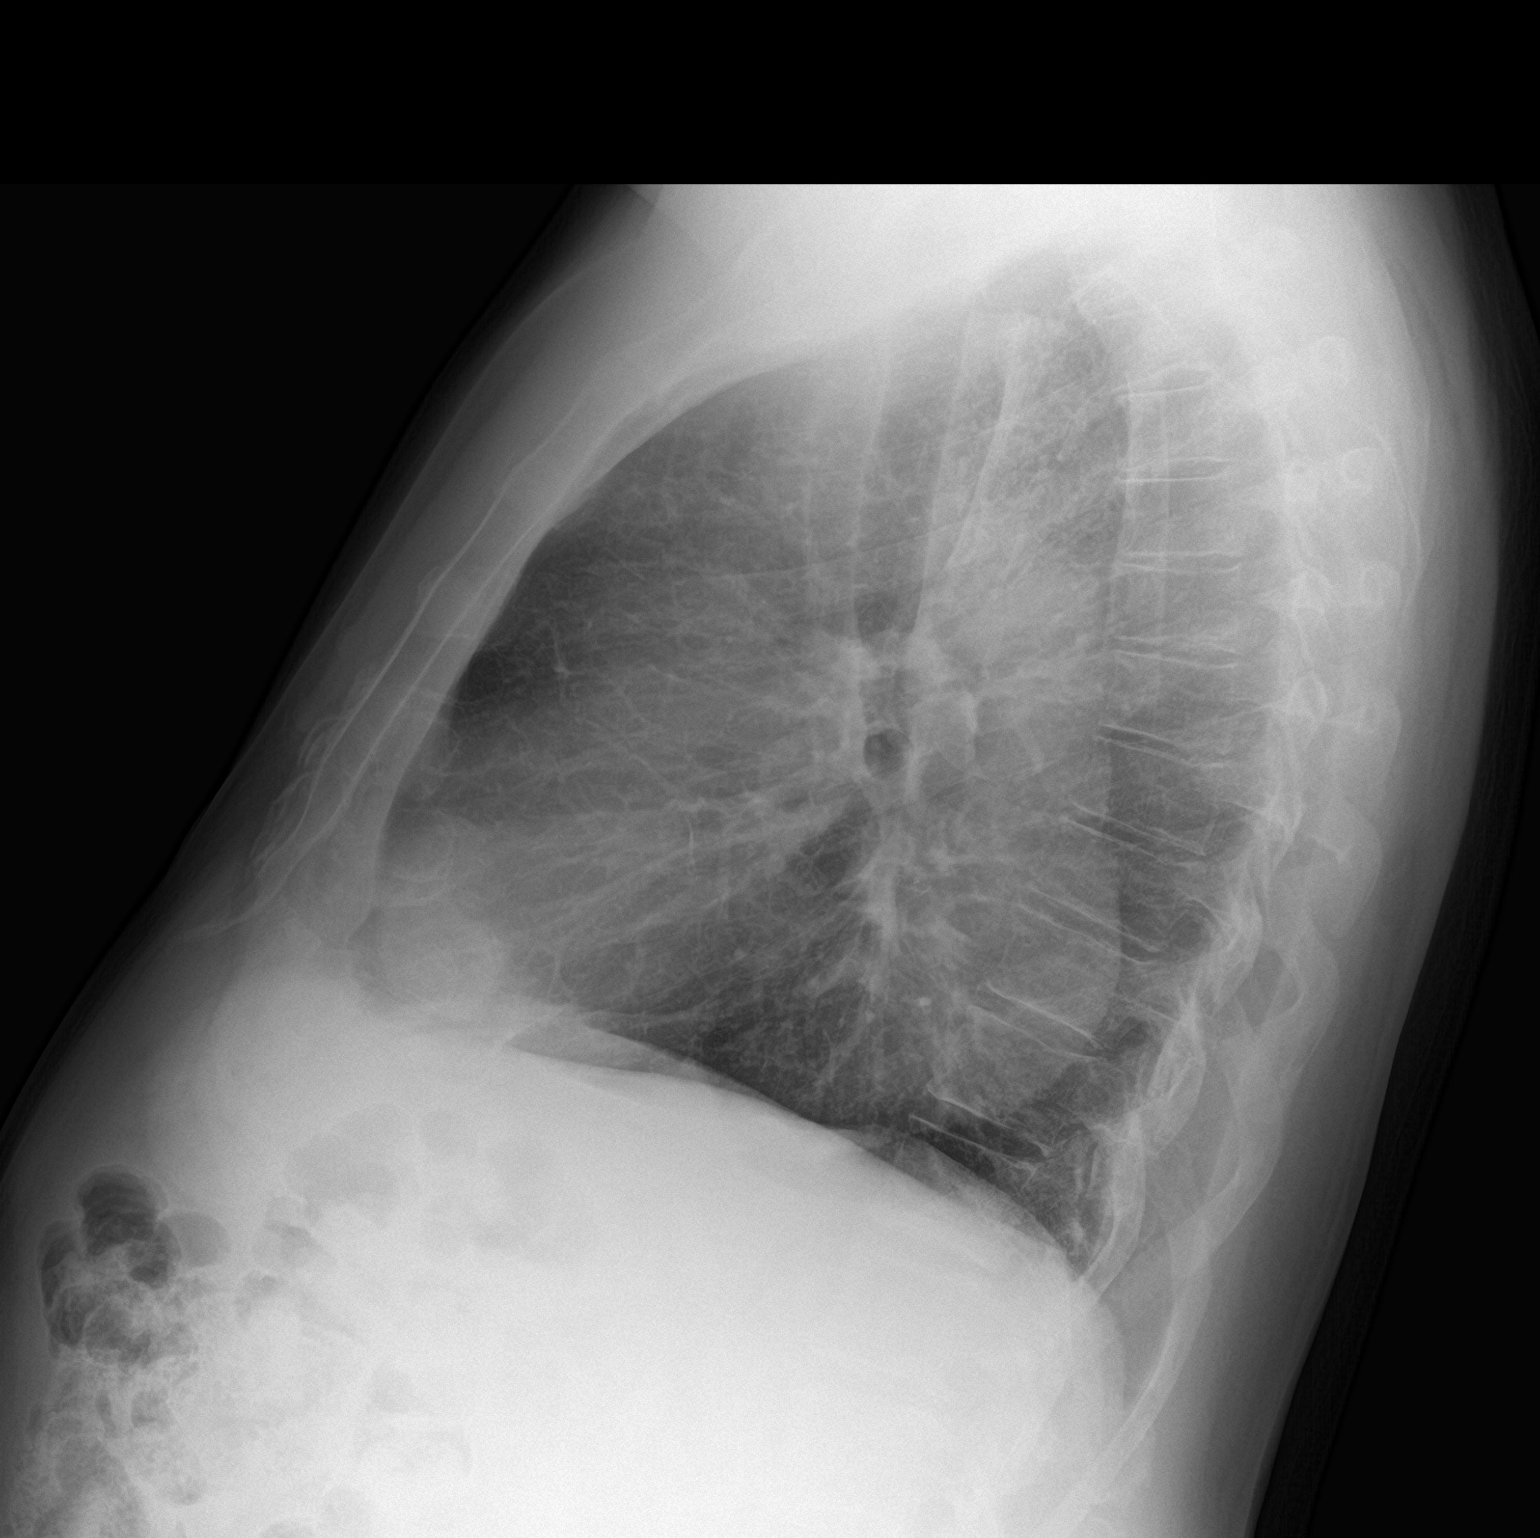

[2 of 2 positions shown; findings below may reference images not displayed]

FINDINGS: Cardiac shadow is at the upper limits of normal in size. Thoracic
aorta is tortuous in nature. Lungs are well aerated bilaterally
without focal infiltrate or effusion. No bony abnormality is noted.
IMPRESSION: No active cardiopulmonary disease.

## 2021-12-07 ENCOUNTER — Other Ambulatory Visit: Payer: Self-pay | Admitting: Family Medicine

## 2021-12-17 ENCOUNTER — Ambulatory Visit (INDEPENDENT_AMBULATORY_CARE_PROVIDER_SITE_OTHER): Payer: Medicare Other

## 2021-12-17 VITALS — Ht 66.0 in | Wt 208.0 lb

## 2021-12-17 DIAGNOSIS — Z0000 Encounter for general adult medical examination without abnormal findings: Secondary | ICD-10-CM | POA: Diagnosis not present

## 2021-12-17 NOTE — Progress Notes (Signed)
Subjective:   Gabriel [name] is a 71 y.o. male who presents for Medicare Annual/Subsequent preventive examination.  I connected with Zackrey today by telephone and verified that I am speaking with the correct person using two identifiers. Location patient: home Location provider: work Persons participating in the virtual visit: patient, Marine scientist.    I discussed the limitations, risks, security and privacy concerns of performing an evaluation and management service by telephone and the availability of in person appointments. I also discussed with the patient that there may be a patient responsible charge related to this service. The patient expressed understanding and verbally consented to this telephonic visit.    Interactive audio and video telecommunications were attempted between this provider and patient, however failed, due to patient having technical difficulties OR patient did not have access to video capability.  We continued and completed visit with audio only.  Some vital signs may be absent or patient reported.   Time Spent with patient on telephone encounter: 20 minutes   Review of Systems     Cardiac Risk Factors include: advanced age (>9mn, >>14women);male gender;hypertension;dyslipidemia;obesity (BMI >30kg/m2)     Objective:    Today's Vitals   12/17/21 0934  Weight: 208 lb (94.3 kg)  Height: '5\' 6"'$  (1.676 m)   Body mass index is 33.57 kg/m.     12/17/2021    9:39 AM 03/25/2021   10:21 AM 12/08/2020    9:03 AM 04/26/2017    2:24 PM  Advanced Directives  Does Patient Have a Medical Advance Directive? Yes Yes No No  Type of [place]amed[place]of [place] will [place] will;Out of facility DNR (pink MOST or yellow form)    Does patient want to make changes to medical advance directive?  No - Patient declined    Copy of HThermalin Chart? No - copy requested No - copy requested    Would patient  like information on creating a medical advance directive?   No - Patient declined Yes (MAU/Ambulatory/Procedural Areas - Information given)    Current Medications (verified) Outpatient Encounter Medications as of 12/17/2021  Medication Sig   enalapril (VASOTEC) 10 MG tablet    hydrochlorothiazide (HYDRODIURIL) 12.5 MG tablet Take 1 tablet (12.5 mg total) by mouth daily.   Multiple Vitamins-Minerals (ANTIOXIDANT VITAMINS) TABS Take 1 tablet by mouth daily. Life Guard   omeprazole (PRILOSEC) 40 MG capsule TAKE 1 CAPSULE (40 MG TOTAL) BY MOUTH DAILY.   pimecrolimus (ELIDEL) 1 % cream Apply 1 application topically daily. (Patient taking differently: Apply 1 application  topically 3 (three) times daily as needed (rash).)   pyridostigmine (MESTINON) 60 MG/5ML solution Take 2.5 mLs by mouth 3 (three) times daily.   Red Yeast Rice Extract 600 MG TABS Take 1 tablet by mouth daily.    sildenafil (REVATIO) 20 MG tablet Take 1-5 tablets (20-100 mg total) by mouth daily as needed.   FLUAD QUADRIVALENT 0.5 ML injection  (Patient not taking: Reported on 12/17/2021)   methocarbamol (ROBAXIN) 500 MG tablet Take 1 tablet (500 mg total) by mouth every 8 (eight) hours as needed. (Patient not taking: Reported on 12/17/2021)   mupirocin ointment (BACTROBAN) 2 % Apply to b/l nares qhs x 7 days then as needed (Patient not taking: Reported on 12/17/2021)   No facility-administered encounter medications on file as of 12/17/2021.    Allergies (verified) Patient has no known allergies.   History: Past Medical History:  Diagnosis Date   Achalasia  Arthritis    Colon cancer screening 08/11/2014   Sees [facility], Dr Carlean Purl    Colon polyp 08/11/2014   Sees [facility], Dr Carlean Purl     Eczema 01/09/2014   Esophageal reflux 05/21/2012   Hyperlipidemia    Hypertension    Insomnia 01/09/2014   Left hip pain 08/11/2014   OA (osteoarthritis) of hip    right   Otitis, externa, infective 03/31/2015   Overweight 11/14/2012   Right hip pain 08/11/2014    Sees chiropractor, Dr Clovis Riley in [place]    Skin lesion of left leg 10/07/2015   Tinnitus 10/12/2016   Ulcer    esophageal ulcer hx   Wears contact lenses    Past Surgical History:  Procedure Laterality Date   COLONOSCOPY     ESOPHAGOGASTRODUODENOSCOPY     HELLER MYOTOMY  1999   help with swallowing   TONSILLECTOMY     TONSILLECTOMY AND ADENOIDECTOMY  1963   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   Procedure: LEFT TOTAL HIP ARTHROPLASTY ANTERIOR APPROACH;  Surgeon: Mcarthur Rossetti, MD;  Location: [facility];  Service: Orthopedics;  Laterality: Left;   TOTAL HIP ARTHROPLASTY Right 05/03/2017   Procedure: RIGHT TOTAL HIP ARTHROPLASTY ANTERIOR APPROACH;  Surgeon: Mcarthur Rossetti, MD;  Location: [facility];  Service: Orthopedics;  Laterality: Right;   UPPER GASTROINTESTINAL ENDOSCOPY     Family History  Problem Relation Age of Onset   Alcohol abuse Mother    Ovarian cancer Mother    Diabetes Maternal Grandmother    Alcohol abuse Father    Lung cancer Father    Mental illness Sister    Depression Sister    Heart disease Brother        congenital heart   Arthritis Sister    Hypertension Sister    Diverticulosis Sister    Healthy Sister    Healthy Sister    Healthy Sister    Colon cancer Neg Hx    Esophageal cancer Neg Hx    Rectal cancer Neg Hx    Stomach cancer Neg Hx    Social History   Socioeconomic History   Marital status: Married    Spouse name: Not on file   Number of children: 2   Years of education: Not on file   Highest education level: Not on file  Occupational History   Occupation: sales/distribution    Employer: [employer]  Tobacco Use   Smoking status: Former    Types: Cigarettes    Quit date: 04/13/1991    Years since quitting: 30.7   Smokeless tobacco: Never   Tobacco comments:    1 ppd for 25 years quit 93'  Vaping Use   Vaping Use: Never used  Substance and Sexual Activity   Alcohol use: Yes    Alcohol/week: 7.0 standard  drinks of alcohol    Types: 7 Glasses of wine per week    Comment: Wine nightly   Drug use: No   Sexual activity: Not on file  Other Topics Concern   Not on file  Social History Narrative   Married 2 daughters   Scientific laboratory technician for [employer]         Social Determinants of Health   Financial Resource Strain: Low Risk  (12/17/2021)   Overall Financial Resource Strain (CARDIA)    Difficulty of Paying Living Expenses: Not hard at all  Food Insecurity: No Food Insecurity (12/17/2021)   Hunger Vital Sign    Worried About Running Out  of Food in the Last Year: Never true    [place] in the Last Year: Never true  Transportation Needs: No Transportation Needs (12/17/2021)   PRAPARE - Hydrologist (Medical): No    Lack of Transportation (Non-Medical): No  Physical Activity: Inactive (12/17/2021)   Exercise Vital Sign    Days of Exercise per Week: 0 days    Minutes of Exercise per Session: 0 min  Stress: No Stress Concern Present (12/17/2021)   [place]    Feeling of Stress : Not at all  Social Connections: [place] (12/17/2021)   Social Connection and Isolation Panel [NHANES]    Frequency of Communication with Friends and Family: More than three times a week    Frequency of Social Gatherings with Friends and Family: More than three times a week    Attends Religious Services: More than 4 times per year    Active Member of [employer] or Organizations: Yes    Attends Music therapist: More than 4 times per year    Marital Status: Married    Tobacco Counseling Counseling given: Not Answered Tobacco comments: 1 ppd for 25 years quit 93'   Clinical Intake:  Pre-visit preparation completed: Yes  Pain : No/denies pain     BMI - recorded: 33.57 Nutritional Status: BMI 25 -29 Overweight Nutritional Risks: None Diabetes: No  How often do you need to  have someone help you when you read instructions, pamphlets, or other written materials from your doctor or pharmacy?: 1 - Never  Diabetic?No  Interpreter Needed?: No  Information entered by :: Caroleen Hamman LPN   Activities of Daily Living    12/17/2021    9:42 AM 12/17/2021    7:51 AM  In your present state of health, do you have any difficulty performing the following activities:  Hearing? 0 0  Vision? 0 0  Difficulty concentrating or making decisions? 0 0  Walking or climbing stairs? 0 0  Dressing or bathing? 0 0  Doing errands, shopping? 0 0  Preparing Food and eating ? N N  Using the Toilet? N N  In the past six months, have you accidently leaked urine? N N  Do you have problems with loss of bowel control? N N  Managing your Medications? N N  Managing your Finances? N N  Housekeeping or managing your Housekeeping? N N    Patient Care Team: Mosie Lukes, MD as PCP - General (Family Medicine)  Indicate any recent Medical Services you may have received from other than [facility] providers in the past year (date may be approximate).     Assessment:   This is a routine wellness examination for [place].  Hearing/Vision screen Hearing Screening - Comments:: No issues Vision Screening - Comments:: Last eye exam-02/2021-Dr. Phineas Douglas  Dietary issues and exercise activities discussed: Current Exercise Habits: The patient has a physically strenuous job, but has no regular exercise apart from work., Exercise limited by: None identified   Goals Addressed             This Visit's Progress    Patient Stated   On track    [place].       Depression Screen    12/17/2021    9:42 AM 07/27/2021    1:18 PM 12/08/2020    9:06 AM 08/28/2020    3:46 PM 12/19/2017    7:46 AM 10/12/2016  4:13 PM  PHQ 2/9 Scores  PHQ - 2 Score 0 0 0 0 0 0    Fall Risk    12/17/2021    9:40 AM 12/17/2021    7:51 AM 07/27/2021    1:18 PM 12/08/2020    9:05 AM 08/28/2020    3:46 PM  Fall Risk    Falls in the past year? 0 0 0 0 0  Number falls in past yr: 0 0 0 0 0  Injury with Fall? 0 0 0 0 0  Risk for fall due to :   No Fall Risks    Follow up Falls prevention discussed  Falls evaluation completed Falls prevention discussed     FALL RISK PREVENTION PERTAINING TO THE HOME:  Any stairs in or around the home? No  Home free of loose throw rugs in walkways, pet beds, electrical cords, etc? Yes  Adequate lighting in your home to reduce risk of falls? Yes   ASSISTIVE DEVICES UTILIZED TO PREVENT FALLS:  Life alert? No  Use of a cane, walker or w/c? No  Grab bars in the bathroom? No  Shower chair or bench in shower? No  Elevated toilet seat or a handicapped toilet? No   TIMED UP AND GO:  Was the test performed? No . Phone visit   Cognitive Function:Normal cognitive status assessed by this Nurse Health Advisor. No abnormalities found.          12/17/2021    9:46 AM  6CIT Screen  What Year? 0 points  What month? 0 points  What time? 0 points  Count back from 20 0 points  Months in reverse 2 points  Repeat phrase 0 points  Total Score 2 points    Immunizations Immunization History  Administered Date(s) Administered   Fluad Quad(high Dose 65+) 02/09/2019, 01/22/2020   Influenza, High Dose Seasonal PF 12/19/2017, 12/19/2017   Influenza,inj,Quad PF,6+ Mos 03/31/2015, 03/17/2016   Influenza-Unspecified 03/31/2015   Moderna Sars-Covid-2 Vaccination 05/26/2019, 06/23/2019, 03/05/2020   Pneumococcal Conjugate-13 12/19/2017   Pneumococcal Polysaccharide-23 02/09/2019   Tdap 04/15/2008   Zoster, Live 12/03/2015    TDAP status: Due, Education has been provided regarding the importance of this vaccine. Advised may receive this vaccine at local pharmacy or Health Dept. Aware to provide a copy of the vaccination record if obtained from local pharmacy or Health Dept. Verbalized acceptance and understanding.  Flu Vaccine status: Due, Education has been provided regarding the  importance of this vaccine. Advised may receive this vaccine at local pharmacy or Health Dept. Aware to provide a copy of the vaccination record if obtained from local pharmacy or Health Dept. Verbalized acceptance and understanding.  Pneumococcal vaccine status: Up to date  Covid-19 vaccine status: Information provided on how to obtain vaccines.   Qualifies for Shingles Vaccine? Yes   Zostavax completed Yes   Shingrix Completed?: No.    Education has been provided regarding the importance of this vaccine. Patient has been advised to call insurance company to determine out of pocket expense if they have not yet received this vaccine. Advised may also receive vaccine at local pharmacy or Health Dept. Verbalized acceptance and understanding.  Screening Tests Health Maintenance  Topic Date Due   Hepatitis C Screening  Never done   Zoster Vaccines- Shingrix (1 of 2) Never done   TETANUS/TDAP  04/15/2018   COVID-19 Vaccine (4 - Moderna risk series) 04/30/2020   INFLUENZA VACCINE  11/10/2021   COLONOSCOPY (Pts 45-71yr Insurance coverage will need to  be confirmed)  01/31/2023   Pneumonia Vaccine 36+ Years old  Completed   HPV VACCINES  Aged Out    Health Maintenance  Health Maintenance Due  Topic Date Due   Hepatitis C Screening  Never done   Zoster Vaccines- Shingrix (1 of 2) Never done   TETANUS/TDAP  04/15/2018   COVID-19 Vaccine (4 - Moderna risk series) 04/30/2020   INFLUENZA VACCINE  11/10/2021    Colorectal cancer screening: Type of screening: Colonoscopy. Completed 01/30/2013. Repeat every 10 years  Lung Cancer Screening: (Low Dose CT Chest recommended if Age 31-80 years, 30 pack-year currently smoking OR have quit w/in 15years.) does not qualify.     Additional Screening:  Hepatitis C Screening: does qualify; Patient to discuss with PCP at next visit  Vision Screening: Recommended annual ophthalmology exams for early detection of glaucoma and other disorders of the  eye. Is the patient up to date with their annual eye exam?  Yes  Who is the provider or what is the name of the office in which the patient attends annual eye exams? Dr. Phineas Douglas  Dental Screening: Recommended annual dental exams for proper oral hygiene  Community Resource Referral / Chronic Care Management: CRR required this visit?  No   CCM required this visit?  No      Plan:     I have personally reviewed and noted the following in the patient's chart:   Medical and social history Use of alcohol, tobacco or illicit drugs  Current medications and supplements including opioid prescriptions. Patient is not currently taking opioid prescriptions. Functional ability and status Nutritional status Physical activity Advanced directives List of other physicians Hospitalizations, surgeries, and ER visits in previous 12 months Vitals Screenings to include cognitive, depression, and falls Referrals and appointments  In addition, I have reviewed and discussed with patient certain preventive protocols, quality metrics, and best practice recommendations. A written personalized care plan for preventive services as well as general preventive health recommendations were provided to patient.   Due to this being a telephonic visit, the after visit summary with patients personalized plan was offered to patient via mail or my-chart. Patient would like to access on my-chart   Marta Antu, LPN   04/17/1094  Nurse Health Advisor  Nurse Notes: None

## 2021-12-17 NOTE — Patient Instructions (Signed)
Gabriel [name]  Thank you for taking time to complete your Medicare Wellness Visit. I appreciate your ongoing commitment to your health goals. Please review the following plan we discussed and let me know if I can assist you in the future.   Screening recommendations/referrals: Colonoscopy: Completed 01/30/2013-Due 01/31/2023 Recommended yearly ophthalmology/optometry visit for glaucoma screening and checkup Recommended yearly dental visit for hygiene and checkup  Vaccinations: Influenza vaccine: Due-May obtain vaccine at our office or your local pharmacy. Pneumococcal vaccine: Up to date Tdap vaccine: Due-May obtain vaccine at your local pharmacy. Shingles vaccine: Due-May obtain vaccine at your local pharmacy.   Covid-19: May obtain booster at your local pharmacy.  Advanced directives: Please bring a copy of Living Will and/or Healthcare Power of Attorney for your chart.   Conditions/risks identified: See problem list  Next appointment: Follow up in one year for your annual wellness visit.   Preventive Care 70 Years and Older, Male Preventive care refers to lifestyle choices and visits with your health care provider that can promote health and wellness. What does preventive care include? A yearly physical exam. This is also called an annual well check. Dental exams once or twice a year. Routine eye exams. Ask your health care provider how often you should have your eyes checked. Personal lifestyle choices, including: Daily care of your teeth and gums. Regular physical activity. Eating a healthy diet. Avoiding tobacco and drug use. Limiting alcohol use. Practicing safe sex. Taking low doses of aspirin every day. Taking vitamin and mineral supplements as recommended by your health care provider. What happens during an annual well check? The services and screenings done by your health care provider during your annual well check will depend on your age, overall health, lifestyle risk  factors, and family history of disease. Counseling  Your health care provider may ask you questions about your: Alcohol use. Tobacco use. Drug use. Emotional well-being. Home and relationship well-being. Sexual activity. Eating habits. History of falls. Memory and ability to understand (cognition). Work and work Statistician. Screening  You may have the following tests or measurements: Height, weight, and BMI. Blood pressure. Lipid and cholesterol levels. These may be checked every 5 years, or more frequently if you are over 58 years old. Skin check. Lung cancer screening. You may have this screening every year starting at age 70 if you have a 30-pack-year history of smoking and currently smoke or have quit within the past 15 years. Fecal occult blood test (FOBT) of the stool. You may have this test every year starting at age 70. Flexible sigmoidoscopy or colonoscopy. You may have a sigmoidoscopy every 5 years or a colonoscopy every 10 years starting at age 70. Prostate cancer screening. Recommendations will vary depending on your family history and other risks. Hepatitis C blood test. Hepatitis B blood test. Sexually transmitted disease (STD) testing. Diabetes screening. This is done by checking your blood sugar (glucose) after you have not eaten for a while (fasting). You may have this done every 1-3 years. Abdominal aortic aneurysm (AAA) screening. You may need this if you are a current or former smoker. Osteoporosis. You may be screened starting at age 70 if you are at high risk. Talk with your health care provider about your test results, treatment options, and if necessary, the need for more tests. Vaccines  Your health care provider may recommend certain vaccines, such as: Influenza vaccine. This is recommended every year. Tetanus, diphtheria, and acellular pertussis (Tdap, Td) vaccine. You may need a Td booster  every 10 years. Zoster vaccine. You may need this after age  70. Pneumococcal 13-valent conjugate (PCV13) vaccine. One dose is recommended after age 32. Pneumococcal polysaccharide (PPSV23) vaccine. One dose is recommended after age 54. Talk to your health care provider about which screenings and vaccines you need and how often you need them. This information is not intended to replace advice given to you by your health care provider. Make sure you discuss any questions you have with your health care provider. Document Released: 04/25/2015 Document Revised: 12/17/2015 Document Reviewed: 01/28/2015 Elsevier Interactive Patient Education  2017 [place] Prevention in the Home Falls can cause injuries. They can happen to people of all ages. There are many things you can do to make your home safe and to help prevent falls. What can I do on the outside of my home? Regularly fix the edges of walkways and driveways and fix any cracks. Remove anything that might make you trip as you walk through a door, such as a raised step or threshold. Trim any bushes or trees on the path to your home. Use bright outdoor lighting. Clear any walking paths of anything that might make someone trip, such as rocks or tools. Regularly check to see if handrails are loose or broken. Make sure that both sides of any steps have handrails. Any raised decks and porches should have guardrails on the edges. Have any leaves, snow, or ice cleared regularly. Use sand or salt on walking paths during winter. Clean up any spills in your garage right away. This includes oil or grease spills. What can I do in the bathroom? Use night lights. Install grab bars by the toilet and in the tub and shower. Do not use towel bars as grab bars. Use non-skid mats or decals in the tub or shower. If you need to sit down in the shower, use a plastic, non-slip stool. Keep the floor dry. Clean up any water that spills on the floor as soon as it happens. Remove soap buildup in the tub or shower  regularly. Attach bath mats securely with double-sided non-slip rug tape. Do not have throw rugs and other things on the floor that can make you trip. What can I do in the bedroom? Use night lights. Make sure that you have a light by your bed that is easy to reach. Do not use any sheets or blankets that are too big for your bed. They should not hang down onto the floor. Have a firm chair that has side arms. You can use this for support while you get dressed. Do not have throw rugs and other things on the floor that can make you trip. What can I do in the kitchen? Clean up any spills right away. Avoid walking on wet floors. Keep items that you use a lot in easy-to-reach places. If you need to reach something above you, use a strong step stool that has a grab bar. Keep electrical cords out of the way. Do not use floor polish or wax that makes floors slippery. If you must use wax, use non-skid floor wax. Do not have throw rugs and other things on the floor that can make you trip. What can I do with my stairs? Do not leave any items on the stairs. Make sure that there are handrails on both sides of the stairs and use them. Fix handrails that are broken or loose. Make sure that handrails are as long as the stairways. Check any carpeting to  make sure that it is firmly attached to the stairs. Fix any carpet that is loose or worn. Avoid having throw rugs at the top or bottom of the stairs. If you do have throw rugs, attach them to the floor with carpet tape. Make sure that you have a light switch at the top of the stairs and the bottom of the stairs. If you do not have them, ask someone to add them for you. What else can I do to help prevent falls? Wear shoes that: Do not have high heels. Have rubber bottoms. Are comfortable and fit you well. Are closed at the toe. Do not wear sandals. If you use a stepladder: Make sure that it is fully opened. Do not climb a closed stepladder. Make sure that  both sides of the stepladder are locked into place. Ask someone to hold it for you, if possible. Clearly mark and make sure that you can see: Any grab bars or handrails. First and last steps. Where the edge of each step is. Use tools that help you move around (mobility aids) if they are needed. These include: Canes. Walkers. Scooters. Crutches. Turn on the lights when you go into a dark area. Replace any light bulbs as soon as they burn out. Set up your furniture so you have a clear path. Avoid moving your furniture around. If any of your floors are uneven, fix them. If there are any pets around you, be aware of where they are. Review your medicines with your doctor. Some medicines can make you feel dizzy. This can increase your chance of falling. Ask your doctor what other things that you can do to help prevent falls. This information is not intended to replace advice given to you by your health care provider. Make sure you discuss any questions you have with your health care provider. Document Released: 01/23/2009 Document Revised: 09/04/2015 Document Reviewed: 05/03/2014 Elsevier Interactive Patient Education  2017 [employer].

## 2022-01-01 ENCOUNTER — Encounter: Payer: Self-pay | Admitting: Family Medicine

## 2022-01-11 ENCOUNTER — Other Ambulatory Visit: Payer: Self-pay | Admitting: *Deleted

## 2022-01-11 DIAGNOSIS — R739 Hyperglycemia, unspecified: Secondary | ICD-10-CM

## 2022-01-11 DIAGNOSIS — R972 Elevated prostate specific antigen [PSA]: Secondary | ICD-10-CM

## 2022-01-11 DIAGNOSIS — I10 Essential (primary) hypertension: Secondary | ICD-10-CM

## 2022-01-11 DIAGNOSIS — E785 Hyperlipidemia, unspecified: Secondary | ICD-10-CM

## 2022-02-22 ENCOUNTER — Other Ambulatory Visit: Payer: Self-pay | Admitting: Family Medicine

## 2022-02-22 MED ORDER — ENALAPRIL MALEATE 10 MG PO TABS: 10.0000 mg | ORAL_TABLET | Freq: Every day | ORAL | 0 refills | Status: DC

## 2022-02-25 ENCOUNTER — Telehealth: Payer: Self-pay | Admitting: Family Medicine

## 2022-02-25 NOTE — Telephone Encounter (Signed)
Pt states he has only ever taken 20 mg of enalapril (VASOTEC) and would like to know if it was meant to be changed to 10 mg. Please advise.    [id number] [place], [place] - [address] [id number]: [phone] Fax: [phone]

## 2022-02-26 ENCOUNTER — Other Ambulatory Visit: Payer: Self-pay

## 2022-02-26 MED ORDER — ENALAPRIL MALEATE 20 MG PO TABS: 20.0000 mg | ORAL_TABLET | Freq: Every day | ORAL | 1 refills | Status: DC

## 2022-02-26 NOTE — Telephone Encounter (Signed)
Called pt and pt stated he has been taking 20 mg.  Sent in refill for 20 mg with 1 refill and follow up appt was made

## 2022-03-24 DIAGNOSIS — D2272 Melanocytic nevi of left lower limb, including hip: Secondary | ICD-10-CM | POA: Diagnosis not present

## 2022-03-24 DIAGNOSIS — L920 Granuloma annulare: Secondary | ICD-10-CM | POA: Diagnosis not present

## 2022-03-24 DIAGNOSIS — D225 Melanocytic nevi of trunk: Secondary | ICD-10-CM | POA: Diagnosis not present

## 2022-03-24 DIAGNOSIS — D235 Other benign neoplasm of skin of trunk: Secondary | ICD-10-CM | POA: Diagnosis not present

## 2022-03-24 DIAGNOSIS — L814 Other melanin hyperpigmentation: Secondary | ICD-10-CM | POA: Diagnosis not present

## 2022-03-24 DIAGNOSIS — D1801 Hemangioma of skin and subcutaneous tissue: Secondary | ICD-10-CM | POA: Diagnosis not present

## 2022-03-24 DIAGNOSIS — D2261 Melanocytic nevi of right upper limb, including shoulder: Secondary | ICD-10-CM | POA: Diagnosis not present

## 2022-03-24 DIAGNOSIS — D2262 Melanocytic nevi of left upper limb, including shoulder: Secondary | ICD-10-CM | POA: Diagnosis not present

## 2022-03-24 DIAGNOSIS — L821 Other seborrheic keratosis: Secondary | ICD-10-CM | POA: Diagnosis not present

## 2022-03-24 DIAGNOSIS — D2371 Other benign neoplasm of skin of right lower limb, including hip: Secondary | ICD-10-CM | POA: Diagnosis not present

## 2022-03-24 DIAGNOSIS — Z85828 Personal history of other malignant neoplasm of skin: Secondary | ICD-10-CM | POA: Diagnosis not present

## 2022-04-02 ENCOUNTER — Ambulatory Visit (INDEPENDENT_AMBULATORY_CARE_PROVIDER_SITE_OTHER): Payer: Medicare Other | Admitting: Family

## 2022-04-02 ENCOUNTER — Encounter: Payer: Self-pay | Admitting: Family

## 2022-04-02 VITALS — BP 138/78 | HR 88 | Temp 97.9°F | Ht 66.0 in | Wt 202.4 lb

## 2022-04-02 DIAGNOSIS — J209 Acute bronchitis, unspecified: Secondary | ICD-10-CM | POA: Diagnosis not present

## 2022-04-02 DIAGNOSIS — J019 Acute sinusitis, unspecified: Secondary | ICD-10-CM

## 2022-04-02 MED ORDER — DOXYCYCLINE HYCLATE 100 MG PO TABS: 100.0000 mg | ORAL_TABLET | Freq: Two times a day (BID) | ORAL | 0 refills | Status: DC

## 2022-04-02 NOTE — Progress Notes (Signed)
Gabriel [name] is a 71 y.o. 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   Tinnitus 10/12/2016   Unilateral primary osteoarthritis, left hip 03/16/2016   Status post left hip replacement 03/16/2016   Skin lesion of left leg 10/07/2015   Otitis, externa, infective 03/31/2015   Colon polyp 08/11/2014   Insomnia 01/09/2014   Hyperglycemia 01/09/2014   Eczema 01/09/2014   Weight loss 05/02/2013   Enlarged prostate 01/30/2013   Esophageal stricture 01/30/2013   Overweight 11/14/2012   Esophageal reflux 05/21/2012   Hyperlipidemia 01/09/2012   Preventative health care 09/29/2011   HTN (hypertension) 03/27/2011   Achalasia 03/27/2011    Current Outpatient Medications  Medication Sig Dispense Refill   doxycycline (VIBRA-TABS) 100 MG tablet Take 1 tablet (100 mg total) by mouth 2 (two) times daily. 14 tablet 0   FLUAD QUADRIVALENT 0.5 ML injection      hydrochlorothiazide (HYDRODIURIL) 12.5 MG tablet Take 1 tablet (12.5 mg total) by mouth daily. 90 tablet 1   Multiple Vitamins-Minerals (ANTIOXIDANT VITAMINS) TABS Take 1 tablet by mouth daily. Life Guard     mupirocin ointment (BACTROBAN) 2 % Apply to b/l nares qhs x 7 days then as needed 22 g 1   omeprazole (PRILOSEC) 40 MG capsule TAKE 1 CAPSULE (40 MG TOTAL) BY MOUTH DAILY. 90 capsule 1   pimecrolimus (ELIDEL) 1 % cream Apply 1 application topically daily. (Patient taking differently: Apply 1 application  topically 3 (three) times daily as needed (rash).) 60 g 5   pyridostigmine (MESTINON) 60 MG/5ML solution Take 2.5 mLs by mouth 3 (three) times daily.  12   Red Yeast Rice Extract 600 MG TABS Take 1 tablet by mouth daily.       sildenafil (REVATIO) 20 MG tablet Take 1-5 tablets (20-100 mg total) by mouth daily as needed. 30 tablet 2   enalapril (VASOTEC) 20 MG tablet Take 1 tablet (20 mg total) by mouth daily. 90 tablet 1   methocarbamol (ROBAXIN) 500 MG tablet Take 1 tablet (500 mg total) by mouth every 8 (eight) hours as needed. (Patient not taking: Reported on 12/17/2021) 30 tablet 0   No current facility-administered medications for this visit.    Allergies: Patient has no known allergies.  Past Medical History:  Diagnosis Date   Achalasia    Arthritis    Colon cancer screening 08/11/2014   Sees [facility], Dr Carlean Purl    Colon polyp 08/11/2014   Sees [facility], Dr Carlean Purl     Eczema 01/09/2014   Esophageal reflux 05/21/2012   Hyperlipidemia    Hypertension    Insomnia 01/09/2014   Left hip pain 08/11/2014   OA (osteoarthritis) of hip    right   Otitis, externa, infective 03/31/2015   Overweight 11/14/2012   Right hip pain 08/11/2014   Sees chiropractor, Dr Clovis Riley in [place]    Skin lesion of left leg 10/07/2015   Tinnitus 10/12/2016   Ulcer    esophageal ulcer hx   Wears contact lenses     Past Surgical History:  Procedure Laterality Date   COLONOSCOPY     ESOPHAGOGASTRODUODENOSCOPY     HELLER MYOTOMY  1999   help with swallowing   TONSILLECTOMY  TONSILLECTOMY AND ADENOIDECTOMY  1963   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   Procedure: LEFT TOTAL HIP ARTHROPLASTY ANTERIOR APPROACH;  Surgeon: Mcarthur Rossetti, MD;  Location: [place];  Service: Orthopedics;  Laterality: Left;   TOTAL HIP ARTHROPLASTY Right 05/03/2017   Procedure: RIGHT TOTAL HIP ARTHROPLASTY ANTERIOR APPROACH;  Surgeon: Mcarthur Rossetti, MD;  Location: [place];  Service: Orthopedics;  Laterality: Right;   UPPER GASTROINTESTINAL ENDOSCOPY      Family History  Problem Relation Age of Onset   Alcohol abuse Mother    Ovarian cancer Mother    Diabetes Maternal Grandmother    Alcohol abuse Father    Lung cancer Father    Mental illness  Sister    Depression Sister    Heart disease Brother        congenital heart   Arthritis Sister    Hypertension Sister    Diverticulosis Sister    Healthy Sister    Healthy Sister    Healthy Sister    Colon cancer Neg Hx    Esophageal cancer Neg Hx    Rectal cancer Neg Hx    Stomach cancer Neg Hx     Social History   Tobacco Use   Smoking status: Former    Types: Cigarettes    Quit date: 04/13/1991    Years since quitting: 30.9   Smokeless tobacco: Never   Tobacco comments:    1 ppd for 25 years quit 93'  Substance Use Topics   Alcohol use: Yes    Alcohol/week: 7.0 standard drinks of alcohol    Types: 7 Glasses of wine per week    Comment: Wine nightly    Subjective:   Recurrent cough on and off since November; was feeling better until he had to work outside at his job in the past week and felt like symptoms re-flared; + sinus drainage; using OTC Mucinex with some relief;   Objective:  Vitals:   04/02/22 1233  BP: 138/78  Pulse: 88  Temp: 97.9 F (36.6 C)  TempSrc: Oral  SpO2: 96%  Weight: 202 lb 6.4 oz (91.8 kg)  Height: '5\' 6"'$  (1.676 m)    General: Well developed, well nourished, in no acute distress  Skin : Warm and dry.  Head: Normocephalic and atraumatic  Eyes: Sclera and conjunctiva clear; pupils round and reactive to light; extraocular movements intact  Ears: External normal; canals clear; tympanic membranes normal  Oropharynx: Pink, supple. No suspicious lesions  Neck: Supple without thyromegaly, adenopathy  Lungs: Respirations unlabored; clear to auscultation bilaterally without wheeze, rales, rhonchi  CVS exam: normal rate and regular rhythm.  Neurologic: Alert and oriented; speech intact; face symmetrical; moves all extremities well; CNII-XII intact without focal deficit   Assessment:  1. Acute sinusitis, recurrence not specified, unspecified location   2. Acute bronchitis, unspecified organism     Plan:  Rx for Doxycycline 100 mg bid x 7 days;  continue Mucinex and could consider re-starting Claritin as well; increase fluids, rest; follow up if symptoms persist and will need to consider CXR.   No follow-ups on file.  No orders of the defined types were placed in this encounter.   Requested Prescriptions   Signed Prescriptions Disp Refills   doxycycline (VIBRA-TABS) 100 MG tablet 14 tablet 0    Sig: Take 1 tablet (100 mg total) by mouth 2 (two) times daily.

## 2022-04-18 ENCOUNTER — Other Ambulatory Visit: Payer: Self-pay | Admitting: Family Medicine

## 2022-04-18 DIAGNOSIS — K2100 Gastro-esophageal reflux disease with esophagitis, without bleeding: Secondary | ICD-10-CM

## 2022-04-18 DIAGNOSIS — R1310 Dysphagia, unspecified: Secondary | ICD-10-CM

## 2022-04-18 DIAGNOSIS — K220 Achalasia of cardia: Secondary | ICD-10-CM

## 2022-04-18 DIAGNOSIS — K222 Esophageal obstruction: Secondary | ICD-10-CM

## 2022-06-12 ENCOUNTER — Other Ambulatory Visit: Payer: Self-pay | Admitting: Family Medicine

## 2022-06-16 DIAGNOSIS — Q398 Other congenital malformations of esophagus: Secondary | ICD-10-CM | POA: Diagnosis not present

## 2022-06-16 DIAGNOSIS — Z9889 Other specified postprocedural states: Secondary | ICD-10-CM | POA: Diagnosis not present

## 2022-06-16 DIAGNOSIS — M1990 Unspecified osteoarthritis, unspecified site: Secondary | ICD-10-CM | POA: Diagnosis not present

## 2022-06-16 DIAGNOSIS — Z79899 Other long term (current) drug therapy: Secondary | ICD-10-CM | POA: Diagnosis not present

## 2022-06-16 DIAGNOSIS — K220 Achalasia of cardia: Secondary | ICD-10-CM | POA: Diagnosis not present

## 2022-06-16 DIAGNOSIS — Z8719 Personal history of other diseases of the digestive system: Secondary | ICD-10-CM | POA: Diagnosis not present

## 2022-06-16 DIAGNOSIS — E785 Hyperlipidemia, unspecified: Secondary | ICD-10-CM | POA: Diagnosis not present

## 2022-06-16 DIAGNOSIS — Z87891 Personal history of nicotine dependence: Secondary | ICD-10-CM | POA: Diagnosis not present

## 2022-06-16 DIAGNOSIS — K2100 Gastro-esophageal reflux disease with esophagitis, without bleeding: Secondary | ICD-10-CM | POA: Diagnosis not present

## 2022-06-16 DIAGNOSIS — K222 Esophageal obstruction: Secondary | ICD-10-CM | POA: Diagnosis not present

## 2022-06-16 DIAGNOSIS — I10 Essential (primary) hypertension: Secondary | ICD-10-CM | POA: Diagnosis not present

## 2022-07-25 NOTE — Assessment & Plan Note (Signed)
Encourage heart healthy diet such as MIND or DASH diet, increase exercise, avoid trans fats, simple carbohydrates and processed foods, consider a krill or fish or flaxseed oil cap daily.  Supplement and monitor

## 2022-07-25 NOTE — Assessment & Plan Note (Signed)
will set up patient to check sugars with glucometer. hgba1c acceptable, minimize simple carbs. Increase exercise as tolerated.

## 2022-07-25 NOTE — Assessment & Plan Note (Signed)
Well controlled, no changes to meds. Encouraged heart healthy diet such as the DASH diet and exercise as tolerated.  °

## 2022-07-26 ENCOUNTER — Other Ambulatory Visit: Payer: Self-pay | Admitting: Family Medicine

## 2022-07-26 ENCOUNTER — Ambulatory Visit (INDEPENDENT_AMBULATORY_CARE_PROVIDER_SITE_OTHER): Payer: Medicare Other | Admitting: Family Medicine

## 2022-07-26 VITALS — BP 140/80 | HR 84 | Temp 98.0°F | Resp 16 | Ht 66.0 in | Wt 207.6 lb

## 2022-07-26 DIAGNOSIS — E785 Hyperlipidemia, unspecified: Secondary | ICD-10-CM

## 2022-07-26 DIAGNOSIS — E1169 Type 2 diabetes mellitus with other specified complication: Secondary | ICD-10-CM | POA: Diagnosis not present

## 2022-07-26 DIAGNOSIS — I10 Essential (primary) hypertension: Secondary | ICD-10-CM

## 2022-07-26 DIAGNOSIS — R739 Hyperglycemia, unspecified: Secondary | ICD-10-CM

## 2022-07-26 DIAGNOSIS — N400 Enlarged prostate without lower urinary tract symptoms: Secondary | ICD-10-CM

## 2022-07-26 DIAGNOSIS — H9313 Tinnitus, bilateral: Secondary | ICD-10-CM

## 2022-07-26 DIAGNOSIS — E669 Obesity, unspecified: Secondary | ICD-10-CM | POA: Diagnosis not present

## 2022-07-26 DIAGNOSIS — G609 Hereditary and idiopathic neuropathy, unspecified: Secondary | ICD-10-CM

## 2022-07-26 DIAGNOSIS — H9193 Unspecified hearing loss, bilateral: Secondary | ICD-10-CM | POA: Diagnosis not present

## 2022-07-26 LAB — CBC WITH DIFFERENTIAL/PLATELET
Basophils Absolute: 0 10*3/uL (ref 0.0–0.1)
Basophils Relative: 0.9 % (ref 0.0–3.0)
Eosinophils Absolute: 0.1 10*3/uL (ref 0.0–0.7)
Eosinophils Relative: 2.7 % (ref 0.0–5.0)
HCT: 46.9 % (ref 39.0–52.0)
Hemoglobin: 16 g/dL (ref 13.0–17.0)
Lymphocytes Relative: 27.3 % (ref 12.0–46.0)
Lymphs Abs: 1.4 10*3/uL (ref 0.7–4.0)
MCHC: 34.1 g/dL (ref 30.0–36.0)
MCV: 94.6 fl (ref 78.0–100.0)
Monocytes Absolute: 0.5 10*3/uL (ref 0.1–1.0)
Monocytes Relative: 9.2 % (ref 3.0–12.0)
Neutro Abs: 3 10*3/uL (ref 1.4–7.7)
Neutrophils Relative %: 59.9 % (ref 43.0–77.0)
Platelets: 295 10*3/uL (ref 150.0–400.0)
RBC: 4.96 Mil/uL (ref 4.22–5.81)
RDW: 12.9 % (ref 11.5–15.5)
WBC: 5.1 10*3/uL (ref 4.0–10.5)

## 2022-07-26 LAB — MICROALBUMIN / CREATININE URINE RATIO
Creatinine,U: 35.2 mg/dL
Microalb Creat Ratio: 2 mg/g (ref 0.0–30.0)
Microalb, Ur: <0.7 mg/dL (ref 0.0–1.9)

## 2022-07-26 LAB — COMPREHENSIVE METABOLIC PANEL
ALT: 31 U/L (ref 0–53)
AST: 20 U/L (ref 0–37)
Albumin: 4.5 g/dL (ref 3.5–5.2)
Alkaline Phosphatase: 55 U/L (ref 39–117)
BUN: 11 mg/dL (ref 6–23)
CO2: 29 mEq/L (ref 19–32)
Calcium: 9.4 mg/dL (ref 8.4–10.5)
Chloride: 93 mEq/L — ABNORMAL LOW (ref 96–112)
Creatinine, Ser: 0.88 mg/dL (ref 0.40–1.50)
GFR: 87.08 mL/min (ref >60.00)
Glucose, Bld: 110 mg/dL — ABNORMAL HIGH (ref 70–99)
Potassium: 4.7 mEq/L (ref 3.5–5.1)
Sodium: 131 mEq/L — ABNORMAL LOW (ref 135–145)
Total Bilirubin: 0.8 mg/dL (ref 0.2–1.2)
Total Protein: 6.8 g/dL (ref 6.0–8.3)

## 2022-07-26 LAB — LIPID PANEL
Cholesterol: 189 mg/dL (ref 0–200)
HDL: 60.3 mg/dL (ref >39.00)
LDL Cholesterol: 112 mg/dL — ABNORMAL HIGH (ref 0–99)
NonHDL: 128.61
Total CHOL/HDL Ratio: 3
Triglycerides: 84 mg/dL (ref 0.0–149.0)
VLDL: 16.8 mg/dL (ref 0.0–40.0)

## 2022-07-26 LAB — VITAMIN B12: Vitamin B-12: 789 pg/mL (ref 211–911)

## 2022-07-26 LAB — HEMOGLOBIN A1C: Hgb A1c MFr Bld: 6.2 % (ref 4.6–6.5)

## 2022-07-26 LAB — TSH: TSH: 0.73 u[IU]/mL (ref 0.35–5.50)

## 2022-07-26 MED ORDER — BLOOD GLUCOSE TEST VI STRP: ORAL_STRIP | 1 refills | Status: DC

## 2022-07-26 MED ORDER — LANCETS MISC. MISC: 1 refills | Status: DC

## 2022-07-26 MED ORDER — BLOOD GLUCOSE MONITORING SUPPL DEVI: 0 refills | Status: DC

## 2022-07-26 NOTE — Patient Instructions (Signed)

## 2022-07-26 NOTE — Assessment & Plan Note (Signed)
Follows with urology and is symptomatically better since starting a supplement that includes [employer]

## 2022-07-26 NOTE — Assessment & Plan Note (Signed)
Slowly worsening and now with worsening hearing loss bilaterally. Is referred to audiology for further evaluation

## 2022-07-26 NOTE — Assessment & Plan Note (Signed)
-   Referred to audiology for evaluation.

## 2022-07-26 NOTE — Assessment & Plan Note (Signed)
Likely multifactorial and mild. Just a sense of swelling that is not present over plantar surface of both feet

## 2022-07-26 NOTE — Progress Notes (Signed)
Subjective:    Patient ID: Gabriel [name], male    DOB: 1951/01/14, 72 y.o.   MRN: [id number]  Chief Complaint  Patient presents with   Follow-up    Follow up    HPI Patient is in today for follow up on chronic medical concerns. No recent febrile illness or hospitalizations. Denies CP/palp/SOB/HA/congestion/fevers/GI or GU c/o. Taking meds as prescribed. He is noting worsening tinnitus and hearing loss bilaterally. Is noting a strange sensation of swelling along base of feet but there is no swelling. He continues to follow with urology for his enlarged prostate and is doing better with OTC supplement  Past Medical History:  Diagnosis Date   Achalasia    Arthritis    Colon cancer screening 08/11/2014   Sees [facility], Dr Leone Payor    Colon polyp 08/11/2014   Sees [facility], Dr Leone Payor     Eczema 01/09/2014   Esophageal reflux 05/21/2012   Hyperlipidemia    Hypertension    Insomnia 01/09/2014   Left hip pain 08/11/2014   OA (osteoarthritis) of hip    right   Otitis, externa, infective 03/31/2015   Overweight 11/14/2012   Right hip pain 08/11/2014   Sees chiropractor, Dr Bradly Chris in [place]    Skin lesion of left leg 10/07/2015   Tinnitus 10/12/2016   Ulcer    esophageal ulcer hx   Wears contact lenses     Past Surgical History:  Procedure Laterality Date   COLONOSCOPY     ESOPHAGOGASTRODUODENOSCOPY     HELLER MYOTOMY  1999   help with swallowing   TONSILLECTOMY     TONSILLECTOMY AND ADENOIDECTOMY  1963   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   Procedure: LEFT TOTAL HIP ARTHROPLASTY ANTERIOR APPROACH;  Surgeon: Kathryne Hitch, MD;  Location: [facility];  Service: Orthopedics;  Laterality: Left;   TOTAL HIP ARTHROPLASTY Right 05/03/2017   Procedure: RIGHT TOTAL HIP ARTHROPLASTY ANTERIOR APPROACH;  Surgeon: Kathryne Hitch, MD;  Location: [facility];  Service: Orthopedics;  Laterality: Right;   UPPER GASTROINTESTINAL ENDOSCOPY      Family History  Problem Relation Age of Onset   Alcohol  abuse Mother    Ovarian cancer Mother    Diabetes Maternal Grandmother    Alcohol abuse Father    Lung cancer Father    Mental illness Sister    Depression Sister    Heart disease Brother        congenital heart   Arthritis Sister    Hypertension Sister    Diverticulosis Sister    Healthy Sister    Healthy Sister    Healthy Sister    Colon cancer Neg Hx    Esophageal cancer Neg Hx    Rectal cancer Neg Hx    Stomach cancer Neg Hx     Social History   Socioeconomic History   Marital status: Married    Spouse name: Not on file   Number of children: 2   Years of education: Not on file   Highest education level: Some college, no degree  Occupational History   Occupation: sales/distribution    Employer: [employer]  Tobacco Use   Smoking status: Former    Types: Cigarettes    Quit date: 04/13/1991    Years since quitting: 31.3   Smokeless tobacco: Never   Tobacco comments:    1 ppd for 25 years quit 93'  Vaping Use   Vaping Use: Never used  Substance and Sexual Activity   Alcohol use: Yes  Alcohol/week: 7.0 standard drinks of alcohol    Types: 7 Glasses of wine per week    Comment: Wine nightly   Drug use: No   Sexual activity: Not on file  Other Topics Concern   Not on file  Social History Narrative   Married 2 daughters   Sports administrator for [employer]         Social Determinants of Health   Financial Resource Strain: Low Risk  (07/26/2022)   Overall Financial Resource Strain (CARDIA)    Difficulty of Paying Living Expenses: Not hard at all  Food Insecurity: No Food Insecurity (07/26/2022)   Hunger Vital Sign    Worried About Running Out of Food in the Last Year: Never true    Ran Out of Food in the Last Year: Never true  Transportation Needs: No Transportation Needs (07/26/2022)   PRAPARE - Administrator, Civil Service (Medical): No    Lack of Transportation (Non-Medical): No  Physical Activity: Sufficiently  Active (07/26/2022)   Exercise Vital Sign    Days of Exercise per Week: 5 days    Minutes of Exercise per Session: 30 min  Stress: No Stress Concern Present (07/26/2022)   [employer] of Occupational Health - Occupational Stress Questionnaire    Feeling of Stress : Not at all  Social Connections: Socially Integrated (07/26/2022)   Social Connection and Isolation Panel [NHANES]    Frequency of Communication with Friends and Family: More than three times a week    Frequency of Social Gatherings with Friends and Family: Twice a week    Attends Religious Services: More than 4 times per year    Active Member of [employer] or Organizations: Yes    Attends Engineer, structural: More than 4 times per year    Marital Status: Married  Catering manager Violence: Not At Risk (12/08/2020)   Humiliation, Afraid, Rape, and Kick questionnaire    Fear of Current or Ex-Partner: No    Emotionally Abused: No    Physically Abused: No    Sexually Abused: No    Outpatient Medications Prior to Visit  Medication Sig Dispense Refill   enalapril (VASOTEC) 20 MG tablet Take 1 tablet (20 mg total) by mouth daily. 90 tablet 1   FLUAD QUADRIVALENT 0.5 ML injection      hydrochlorothiazide (HYDRODIURIL) 12.5 MG tablet TAKE 1 TABLET BY MOUTH EVERY DAY 90 tablet 1   Multiple Vitamins-Minerals (ANTIOXIDANT VITAMINS) TABS Take 1 tablet by mouth daily. Life Guard     mupirocin ointment (BACTROBAN) 2 % Apply to b/l nares qhs x 7 days then as needed 22 g 1   omeprazole (PRILOSEC) 40 MG capsule TAKE 1 CAPSULE (40 MG TOTAL) BY MOUTH DAILY. 90 capsule 1   pimecrolimus (ELIDEL) 1 % cream Apply 1 application topically daily. (Patient taking differently: Apply 1 application  topically 3 (three) times daily as needed (rash).) 60 g 5   pyridostigmine (MESTINON) 60 MG/5ML solution Take 2.5 mLs by mouth 3 (three) times daily.  12   Red Yeast Rice Extract 600 MG TABS Take 1 tablet by mouth daily.      sildenafil (REVATIO) 20  MG tablet Take 1-5 tablets (20-100 mg total) by mouth daily as needed. 30 tablet 2   doxycycline (VIBRA-TABS) 100 MG tablet Take 1 tablet (100 mg total) by mouth 2 (two) times daily. 14 tablet 0   methocarbamol (ROBAXIN) 500 MG tablet Take 1 tablet (500 mg total) by mouth  every 8 (eight) hours as needed. 30 tablet 0   No facility-administered medications prior to visit.    No Known Allergies  Review of Systems  Constitutional:  Negative for fever and malaise/fatigue.  HENT:  Positive for hearing loss and tinnitus. Negative for congestion.   Eyes:  Negative for blurred vision.  Respiratory:  Negative for shortness of breath.   Cardiovascular:  Negative for chest pain, palpitations and leg swelling.  Gastrointestinal:  Negative for abdominal pain, blood in stool and nausea.  Genitourinary:  Negative for dysuria and frequency.  Musculoskeletal:  Negative for falls.  Skin:  Negative for rash.  Neurological:  Negative for dizziness, loss of consciousness and headaches.  Endo/Heme/Allergies:  Negative for environmental allergies.  Psychiatric/Behavioral:  Negative for depression. The patient is not nervous/anxious.        Objective:    Physical Exam Constitutional:      General: He is not in acute distress.    Appearance: Normal appearance. He is not ill-appearing or toxic-appearing.  HENT:     Head: Normocephalic and atraumatic.     Right Ear: External ear normal.     Left Ear: External ear normal.     Nose: Nose normal.  Eyes:     General:        Right eye: No discharge.        Left eye: No discharge.  Cardiovascular:     Rate and Rhythm: Normal rate.     Heart sounds: No murmur heard. Pulmonary:     Effort: Pulmonary effort is normal.     Breath sounds: No wheezing.  Abdominal:     General: Bowel sounds are normal.     Tenderness: There is no guarding.  Musculoskeletal:        General: No swelling.     Cervical back: Normal range of motion.  Skin:    Findings: No  rash.  Neurological:     Mental Status: He is alert and oriented to person, place, and time.  Psychiatric:        Behavior: Behavior normal.    BP (!) 140/80 (BP Location: Right Arm, Patient Position: Sitting, Cuff Size: Normal)   Pulse 84   Temp 98 F (36.7 C) (Oral)   Resp 16   Ht 5\' 6"  (1.676 m)   Wt 207 lb 9.6 oz (94.2 kg)   SpO2 96%   BMI 33.51 kg/m  Wt Readings from Last 3 Encounters:  07/26/22 207 lb 9.6 oz (94.2 kg)  04/02/22 202 lb 6.4 oz (91.8 kg)  12/17/21 208 lb (94.3 kg)    Diabetic Foot Exam - Simple   No data filed    Lab Results  Component Value Date   WBC 6.1 07/27/2021   HGB 15.0 07/27/2021   HCT 44.9 07/27/2021   PLT 272.0 07/27/2021   GLUCOSE 108 (H) 07/27/2021   CHOL 195 07/27/2021   TRIG 127.0 07/27/2021   HDL 58.70 07/27/2021   LDLCALC 111 (H) 07/27/2021   ALT 37 07/27/2021   AST 23 07/27/2021   NA 132 (L) 07/27/2021   K 4.3 07/27/2021   CL 94 (L) 07/27/2021   CREATININE 0.91 07/27/2021   BUN 13 07/27/2021   CO2 31 07/27/2021   TSH 1.02 07/27/2021   PSA 4.15 (H) 07/27/2021   HGBA1C 6.6 (H) 07/27/2021   MICROALBUR <0.7 12/19/2017    Lab Results  Component Value Date   TSH 1.02 07/27/2021   Lab Results  Component Value Date  WBC 6.1 07/27/2021   HGB 15.0 07/27/2021   HCT 44.9 07/27/2021   MCV 93.8 07/27/2021   PLT 272.0 07/27/2021   Lab Results  Component Value Date   NA 132 (L) 07/27/2021   K 4.3 07/27/2021   CO2 31 07/27/2021   GLUCOSE 108 (H) 07/27/2021   BUN 13 07/27/2021   CREATININE 0.91 07/27/2021   BILITOT 0.7 07/27/2021   ALKPHOS 53 07/27/2021   AST 23 07/27/2021   ALT 37 07/27/2021   PROT 6.8 07/27/2021   ALBUMIN 4.5 07/27/2021   CALCIUM 9.4 07/27/2021   ANIONGAP 10 05/04/2017   GFR 85.95 07/27/2021   Lab Results  Component Value Date   CHOL 195 07/27/2021   Lab Results  Component Value Date   HDL 58.70 07/27/2021   Lab Results  Component Value Date   LDLCALC 111 (H) 07/27/2021   Lab Results   Component Value Date   TRIG 127.0 07/27/2021   Lab Results  Component Value Date   CHOLHDL 3 07/27/2021   Lab Results  Component Value Date   HGBA1C 6.6 (H) 07/27/2021       Assessment & Plan:  Primary hypertension Assessment & Plan: Well controlled, no changes to meds. Encouraged heart healthy diet such as the DASH diet and exercise as tolerated.    Orders: -     CBC with Differential/Platelet -     Comprehensive metabolic panel -     TSH  Hyperglycemia Assessment & Plan:  will set up patient to check sugars with glucometer. hgba1c acceptable, minimize simple carbs. Increase exercise as tolerated.     Hyperlipidemia, unspecified hyperlipidemia type Assessment & Plan: Encourage heart healthy diet such as MIND or DASH diet, increase exercise, avoid trans fats, simple carbohydrates and processed foods, consider a krill or fish or flaxseed oil cap daily.  Supplement and monitor   Orders: -     Lipid panel  Bilateral hearing loss, unspecified hearing loss type Assessment & Plan: Referred to audiology for evaluation  Orders: -     Ambulatory referral to Audiology  Tinnitus of both ears Assessment & Plan: Slowly worsening and now with worsening hearing loss bilaterally. Is referred to audiology for further evaluation  Orders: -     Ambulatory referral to Audiology  Idiopathic peripheral neuropathy Assessment & Plan: Likely multifactorial and mild. Just a sense of swelling that is not present over plantar surface of both feet  Orders: -     Vitamin B12 -     Vitamin B1  Type 2 diabetes mellitus with obesity Assessment & Plan:  will set up patient to check sugars with glucometer. hgba1c acceptable, minimize simple carbs. Increase exercise as tolerated.    Orders: -     Hemoglobin A1c -     Microalbumin / creatinine urine ratio  Enlarged prostate Assessment & Plan: Follows with urology and is symptomatically better since starting a supplement that  includes [employer]   Other orders -     Blood Glucose Monitoring Suppl; Check blood sugar 3 times daily.  Dispense: 1 each; Refill: 0 -     Lancets Misc.; Check blood sugar 3 times daily.  Dispense: 100 each; Refill: 1 -     Blood Glucose Test; Check blood sugar 3 times daily.  Dispense: 100 strip; Refill: 1    Danise Edge, MD

## 2022-07-29 ENCOUNTER — Other Ambulatory Visit: Payer: Self-pay

## 2022-07-29 DIAGNOSIS — G609 Hereditary and idiopathic neuropathy, unspecified: Secondary | ICD-10-CM

## 2022-07-29 LAB — VITAMIN B1: Vitamin B1 (Thiamine): 43 nmol/L — ABNORMAL HIGH (ref 8–30)

## 2022-08-16 DIAGNOSIS — H903 Sensorineural hearing loss, bilateral: Secondary | ICD-10-CM | POA: Diagnosis not present

## 2022-08-24 ENCOUNTER — Other Ambulatory Visit: Payer: Self-pay | Admitting: Family Medicine

## 2022-08-24 DIAGNOSIS — Z0001 Encounter for general adult medical examination with abnormal findings: Secondary | ICD-10-CM

## 2022-08-24 DIAGNOSIS — N529 Male erectile dysfunction, unspecified: Secondary | ICD-10-CM

## 2022-09-12 ENCOUNTER — Other Ambulatory Visit: Payer: Self-pay | Admitting: Family Medicine

## 2022-10-25 ENCOUNTER — Other Ambulatory Visit: Payer: Self-pay | Admitting: Family Medicine

## 2022-10-25 DIAGNOSIS — R1310 Dysphagia, unspecified: Secondary | ICD-10-CM

## 2022-10-25 DIAGNOSIS — K222 Esophageal obstruction: Secondary | ICD-10-CM

## 2022-10-25 DIAGNOSIS — K2100 Gastro-esophageal reflux disease with esophagitis, without bleeding: Secondary | ICD-10-CM

## 2022-10-25 DIAGNOSIS — K220 Achalasia of cardia: Secondary | ICD-10-CM

## 2022-11-01 ENCOUNTER — Encounter: Payer: Self-pay | Admitting: Podiatry

## 2022-11-01 ENCOUNTER — Ambulatory Visit: Payer: Medicare Other | Admitting: Podiatry

## 2022-11-01 DIAGNOSIS — D2372 Other benign neoplasm of skin of left lower limb, including hip: Secondary | ICD-10-CM

## 2022-11-01 NOTE — Progress Notes (Signed)
  Subjective:  Patient ID: Gabriel [name], male    DOB: 1951/02/24,   MRN: [id number]  Chief Complaint  Patient presents with   Foot Pain    "On my left foot, I have a corn.  It hurts." N - corn L - plantar 5th met left D - 2-3 mos O - gradually worse C - real tender, sharp pain A - pressure on it, especially when I walk barefoot T - soaked couple times    72 y.o. male presents for as above.  . Denies any other pedal complaints. Denies n/v/f/c.   Past Medical History:  Diagnosis Date   Achalasia    Arthritis    Colon cancer screening 08/11/2014   Sees [facility], Dr Leone Payor    Colon polyp 08/11/2014   Se[facility], Dr Leone Payor     Eczema 01/09/2014   Esophageal reflux 05/21/2012   Hyperlipidemia    Hypertension    Insomnia 01/09/2014   Left hip pain 08/11/2014   OA (osteoarthritis) of hip    right   Otitis, externa, infective 03/31/2015   Overweight 11/14/2012   Right hip pain 08/11/2014   Sees chiropractor, Dr Bradly Chris in [place]    Skin lesion of left leg 10/07/2015   Tinnitus 10/12/2016   Ulcer    esophageal ulcer hx   Wears contact lenses     Objective:  Physical Exam: Vascular: DP/PT pulses 2/4 bilateral. CFT <3 seconds. Normal hair growth on digits. No edema.  Skin. No lacerations or abrasions bilateral feet. Hyperkeratotic cored lesion noted sub fifth metatarsal on the left with disruption of skin lines.  Musculoskeletal: MMT 5/5 bilateral lower extremities in DF, PF, Inversion and Eversion. Deceased ROM in DF of ankle joint.  Neurological: Sensation intact to light touch.   Assessment:   1. Benign neoplasm of skin of left foot      Plan:  Patient was evaluated and treated and all questions answered. -Discussed benign lesions.  with patient and treatment options.  -Hyperkeratotic tissue was debrided with chisel without incident.  -Applied salycylic acid treatment to area with dressing. Advised to remove bandaging tomorrow.  -Encouraged daily moisturizing -Discussed use of  pumice stone -Advised good supportive shoes and inserts -Patient to return to office as needed or sooner if condition worsens.   Louann Sjogren, DPM

## 2022-11-08 ENCOUNTER — Other Ambulatory Visit (INDEPENDENT_AMBULATORY_CARE_PROVIDER_SITE_OTHER): Payer: Medicare Other

## 2022-11-08 ENCOUNTER — Telehealth: Payer: Self-pay | Admitting: *Deleted

## 2022-11-08 DIAGNOSIS — G609 Hereditary and idiopathic neuropathy, unspecified: Secondary | ICD-10-CM

## 2022-11-08 NOTE — Addendum Note (Signed)
Addended by: Mervin Kung A on: 11/08/2022 08:09 AM   Modules accepted: Orders

## 2022-11-08 NOTE — Telephone Encounter (Signed)
Opened in error

## 2022-11-08 NOTE — Telephone Encounter (Deleted)
Gabriel [name] is here for labs now.  The future orders in [facility] are from October 2023 and it looks like he had those completed in April of this year.  Per result note from April, it appears he just needs B1 checked today? Please verify and place appropriate future orders.

## 2022-11-10 ENCOUNTER — Encounter: Payer: Self-pay | Admitting: Family Medicine

## 2022-12-10 ENCOUNTER — Other Ambulatory Visit: Payer: Self-pay | Admitting: Family Medicine

## 2023-01-18 ENCOUNTER — Encounter: Payer: Medicare Other | Admitting: Family Medicine

## 2023-02-10 ENCOUNTER — Other Ambulatory Visit: Payer: Self-pay | Admitting: Family Medicine

## 2023-02-10 DIAGNOSIS — Z0001 Encounter for general adult medical examination with abnormal findings: Secondary | ICD-10-CM

## 2023-02-10 DIAGNOSIS — N529 Male erectile dysfunction, unspecified: Secondary | ICD-10-CM

## 2023-02-10 MED ORDER — SILDENAFIL CITRATE 20 MG PO TABS
ORAL_TABLET | ORAL | 2 refills | Status: AC
Start: 2023-02-10 — End: ?

## 2023-02-17 ENCOUNTER — Encounter: Payer: Self-pay | Admitting: Internal Medicine

## 2023-02-23 DIAGNOSIS — K220 Achalasia of cardia: Secondary | ICD-10-CM | POA: Diagnosis not present

## 2023-02-23 DIAGNOSIS — I10 Essential (primary) hypertension: Secondary | ICD-10-CM | POA: Diagnosis not present

## 2023-02-23 DIAGNOSIS — Z79899 Other long term (current) drug therapy: Secondary | ICD-10-CM | POA: Diagnosis not present

## 2023-02-23 DIAGNOSIS — Z91048 Other nonmedicinal substance allergy status: Secondary | ICD-10-CM | POA: Diagnosis not present

## 2023-02-23 DIAGNOSIS — T18128A Food in esophagus causing other injury, initial encounter: Secondary | ICD-10-CM | POA: Diagnosis not present

## 2023-02-23 DIAGNOSIS — Z87891 Personal history of nicotine dependence: Secondary | ICD-10-CM | POA: Diagnosis not present

## 2023-02-23 DIAGNOSIS — E785 Hyperlipidemia, unspecified: Secondary | ICD-10-CM | POA: Diagnosis not present

## 2023-03-10 ENCOUNTER — Other Ambulatory Visit: Payer: Self-pay | Admitting: Family Medicine

## 2023-03-12 ENCOUNTER — Encounter: Payer: Self-pay | Admitting: *Deleted

## 2023-03-25 DIAGNOSIS — D2262 Melanocytic nevi of left upper limb, including shoulder: Secondary | ICD-10-CM | POA: Diagnosis not present

## 2023-03-25 DIAGNOSIS — D2261 Melanocytic nevi of right upper limb, including shoulder: Secondary | ICD-10-CM | POA: Diagnosis not present

## 2023-03-25 DIAGNOSIS — D225 Melanocytic nevi of trunk: Secondary | ICD-10-CM | POA: Diagnosis not present

## 2023-03-25 DIAGNOSIS — L821 Other seborrheic keratosis: Secondary | ICD-10-CM | POA: Diagnosis not present

## 2023-03-25 DIAGNOSIS — D2372 Other benign neoplasm of skin of left lower limb, including hip: Secondary | ICD-10-CM | POA: Diagnosis not present

## 2023-03-25 DIAGNOSIS — D1801 Hemangioma of skin and subcutaneous tissue: Secondary | ICD-10-CM | POA: Diagnosis not present

## 2023-03-25 DIAGNOSIS — L738 Other specified follicular disorders: Secondary | ICD-10-CM | POA: Diagnosis not present

## 2023-03-25 DIAGNOSIS — Z85828 Personal history of other malignant neoplasm of skin: Secondary | ICD-10-CM | POA: Diagnosis not present

## 2023-03-25 DIAGNOSIS — D2371 Other benign neoplasm of skin of right lower limb, including hip: Secondary | ICD-10-CM | POA: Diagnosis not present

## 2023-05-09 ENCOUNTER — Other Ambulatory Visit: Payer: Self-pay | Admitting: Family Medicine

## 2023-05-09 DIAGNOSIS — R1310 Dysphagia, unspecified: Secondary | ICD-10-CM

## 2023-05-09 DIAGNOSIS — K220 Achalasia of cardia: Secondary | ICD-10-CM

## 2023-05-09 DIAGNOSIS — K2100 Gastro-esophageal reflux disease with esophagitis, without bleeding: Secondary | ICD-10-CM

## 2023-05-09 DIAGNOSIS — K222 Esophageal obstruction: Secondary | ICD-10-CM

## 2023-05-13 ENCOUNTER — Encounter: Payer: Self-pay | Admitting: Family Medicine

## 2023-05-13 ENCOUNTER — Ambulatory Visit (INDEPENDENT_AMBULATORY_CARE_PROVIDER_SITE_OTHER): Payer: Medicare Other | Admitting: Family Medicine

## 2023-05-13 VITALS — BP 136/76 | HR 93 | Ht 66.0 in | Wt 208.0 lb

## 2023-05-13 DIAGNOSIS — I10 Essential (primary) hypertension: Secondary | ICD-10-CM | POA: Diagnosis not present

## 2023-05-13 DIAGNOSIS — E669 Obesity, unspecified: Secondary | ICD-10-CM

## 2023-05-13 DIAGNOSIS — E785 Hyperlipidemia, unspecified: Secondary | ICD-10-CM | POA: Diagnosis not present

## 2023-05-13 DIAGNOSIS — Z6833 Body mass index (BMI) 33.0-33.9, adult: Secondary | ICD-10-CM

## 2023-05-13 DIAGNOSIS — E1169 Type 2 diabetes mellitus with other specified complication: Secondary | ICD-10-CM | POA: Diagnosis not present

## 2023-05-13 DIAGNOSIS — Z0000 Encounter for general adult medical examination without abnormal findings: Secondary | ICD-10-CM

## 2023-05-13 DIAGNOSIS — R972 Elevated prostate specific antigen [PSA]: Secondary | ICD-10-CM

## 2023-05-13 LAB — CBC WITH DIFFERENTIAL/PLATELET
Basophils Absolute: 0 10*3/uL (ref 0.0–0.1)
Basophils Relative: 0.6 % (ref 0.0–3.0)
Eosinophils Absolute: 0.2 10*3/uL (ref 0.0–0.7)
Eosinophils Relative: 3.1 % (ref 0.0–5.0)
HCT: 47.1 % (ref 39.0–52.0)
Hemoglobin: 15.8 g/dL (ref 13.0–17.0)
Lymphocytes Relative: 26.1 % (ref 12.0–46.0)
Lymphs Abs: 1.4 10*3/uL (ref 0.7–4.0)
MCHC: 33.5 g/dL (ref 30.0–36.0)
MCV: 95 fL (ref 78.0–100.0)
Monocytes Absolute: 0.5 10*3/uL (ref 0.1–1.0)
Monocytes Relative: 10 % (ref 3.0–12.0)
Neutro Abs: 3.3 10*3/uL (ref 1.4–7.7)
Neutrophils Relative %: 60.2 % (ref 43.0–77.0)
Platelets: 279 10*3/uL (ref 150.0–400.0)
RBC: 4.96 Mil/uL (ref 4.22–5.81)
RDW: 12.5 % (ref 11.5–15.5)
WBC: 5.5 10*3/uL (ref 4.0–10.5)

## 2023-05-13 LAB — TSH: TSH: 1.1 u[IU]/mL (ref 0.35–5.50)

## 2023-05-13 LAB — COMPREHENSIVE METABOLIC PANEL
ALT: 35 U/L (ref 0–53)
AST: 22 U/L (ref 0–37)
Albumin: 4.4 g/dL (ref 3.5–5.2)
Alkaline Phosphatase: 45 U/L (ref 39–117)
BUN: 14 mg/dL (ref 6–23)
CO2: 30 meq/L (ref 19–32)
Calcium: 9.2 mg/dL (ref 8.4–10.5)
Chloride: 96 meq/L (ref 96–112)
Creatinine, Ser: 0.97 mg/dL (ref 0.40–1.50)
GFR: 78.61 mL/min (ref >60.00)
Glucose, Bld: 108 mg/dL — ABNORMAL HIGH (ref 70–99)
Potassium: 4.6 meq/L (ref 3.5–5.1)
Sodium: 133 meq/L — ABNORMAL LOW (ref 135–145)
Total Bilirubin: 0.9 mg/dL (ref 0.2–1.2)
Total Protein: 6.4 g/dL (ref 6.0–8.3)

## 2023-05-13 LAB — LIPID PANEL
Cholesterol: 180 mg/dL (ref 0–200)
HDL: 52 mg/dL (ref >39.00)
LDL Cholesterol: 100 mg/dL — ABNORMAL HIGH (ref 0–99)
NonHDL: 127.5
Total CHOL/HDL Ratio: 3
Triglycerides: 137 mg/dL (ref 0.0–149.0)
VLDL: 27.4 mg/dL (ref 0.0–40.0)

## 2023-05-13 LAB — HEMOGLOBIN A1C: Hgb A1c MFr Bld: 6.4 % (ref 4.6–6.5)

## 2023-05-13 NOTE — Patient Instructions (Signed)
 Preventive Care 73 Years and Older, Male Preventive care refers to lifestyle choices and visits with your health care provider that can promote health and wellness. Preventive care visits are also called wellness exams. What can I expect for my preventive care visit? Counseling During your preventive care visit, your health care provider may ask about your: Medical history, including: Past medical problems. Family medical history. History of falls. Current health, including: Emotional well-being. Home life and relationship well-being. Sexual activity. Memory and ability to understand (cognition). Lifestyle, including: Alcohol, nicotine or tobacco, and drug use. Access to firearms. Diet, exercise, and sleep habits. Work and work Astronomer. Sunscreen use. Safety issues such as seatbelt and bike helmet use. Physical exam Your health care provider will check your: Height and weight. These may be used to calculate your BMI (body mass index). BMI is a measurement that tells if you are at a healthy weight. Waist circumference. This measures the distance around your waistline. This measurement also tells if you are at a healthy weight and may help predict your risk of certain diseases, such as type 2 diabetes and high blood pressure. Heart rate and blood pressure. Body temperature. Skin for abnormal spots. What immunizations do I need?  Vaccines are usually given at various ages, according to a schedule. Your health care provider will recommend vaccines for you based on your age, medical history, and lifestyle or other factors, such as travel or where you work. What tests do I need? Screening Your health care provider may recommend screening tests for certain conditions. This may include: Lipid and cholesterol levels. Diabetes screening. This is done by checking your blood sugar (glucose) after you have not eaten for a while (fasting). Hepatitis C test. Hepatitis B test. HIV (human  immunodeficiency virus) test. STI (sexually transmitted infection) testing, if you are at risk. Lung cancer screening. Colorectal cancer screening. Prostate cancer screening. Abdominal aortic aneurysm (AAA) screening. You may need this if you are a current or former smoker. Talk with your health care provider about your test results, treatment options, and if necessary, the need for more tests. Follow these instructions at home: Eating and drinking  Eat a diet that includes fresh fruits and vegetables, whole grains, lean protein, and low-fat dairy products. Limit your intake of foods with high amounts of sugar, saturated fats, and salt. Take vitamin and mineral supplements as recommended by your health care provider. Do not drink alcohol if your health care provider tells you not to drink. If you drink alcohol: Limit how much you have to 0-2 drinks a day. Know how much alcohol is in your drink. In the [place] one drink equals one 12 oz bottle of beer (355 mL), one 5 oz glass of wine (148 mL), or one 1 oz glass of hard liquor (44 mL). Lifestyle Brush your teeth every morning and night with fluoride toothpaste. Floss one time each day. Exercise for at least 30 minutes 5 or more days each week. Do not use any products that contain nicotine or tobacco. These products include cigarettes, chewing tobacco, and vaping devices, such as e-cigarettes. If you need help quitting, ask your health care provider. Do not use drugs. If you are sexually active, practice safe sex. Use a condom or other form of protection to prevent STIs. Take aspirin only as told by your health care provider. Make sure that you understand how much to take and what form to take. Work with your health care provider to find out whether it is safe  and beneficial for you to take aspirin daily. Ask your health care provider if you need to take a cholesterol-lowering medicine (statin). Find healthy ways to manage stress, such  as: Meditation, yoga, or listening to music. Journaling. Talking to a trusted person. Spending time with friends and family. Safety Always wear your seat belt while driving or riding in a vehicle. Do not drive: If you have been drinking alcohol. Do not ride with someone who has been drinking. When you are tired or distracted. While texting. If you have been using any mind-altering substances or drugs. Wear a helmet and other protective equipment during sports activities. If you have firearms in your house, make sure you follow all gun safety procedures. Minimize exposure to UV radiation to reduce your risk of skin cancer. What's next? Visit your health care provider once a year for an annual wellness visit. Ask your health care provider how often you should have your eyes and teeth checked. Stay up to date on all vaccines. This information is not intended to replace advice given to you by your health care provider. Make sure you discuss any questions you have with your health care provider. Document Revised: 09/24/2020 Document Reviewed: 09/24/2020 Elsevier Patient Education  2024 [employer].

## 2023-05-13 NOTE — Progress Notes (Signed)
Complete physical exam  Patient: Gabriel [name]   DOB: Jan 31, 1951   73 y.o. Male  MRN: [id number]  Subjective:    Chief Complaint  Patient presents with   Annual Exam    Wilfred Dayrit is a 73 y.o. male who presents today for a complete physical exam. He reports consuming a general and low-carb  diet.  He is about to join the [facility] [employer] for exercise.   He generally feels well. He reports sleeping well. He does not have additional problems to discuss today.   Currently lives with: wife Acute concerns or interim problems since last visit:  - Has been following with podiatry for a toenail problem - needs to follow-up with them soon as nail seems to have stopped growing - Has started to notice some mild bilateral foot neuropathy, worse at night. No skin changes or swelling. Not checking his glucose regularly. He is going to try supportive measures and let us or podiatry know if becoming more significant.    Vision concerns: no Dental concerns: no  ETOH use: rare Nicotine use: former Recreational drugs/illegal substances: no     Most recent fall risk assessment:    07/26/2022    9:56 AM  Fall Risk   Falls in the past year? 0  Number falls in past yr: 0  Injury with Fall? 0  Follow up Falls evaluation completed     Most recent depression screenings:    07/26/2022    9:56 AM 04/02/2022   12:36 PM  PHQ 2/9 Scores  PHQ - 2 Score 0 0  PHQ- 9 Score 0              Patient Care Team: Bradd Canary, MD as PCP - General (Family Medicine)   Outpatient Medications Prior to Visit  Medication Sig   enalapril (VASOTEC) 20 MG tablet TAKE 1 TABLET BY MOUTH EVERY DAY   FLUAD QUADRIVALENT 0.5 ML injection    hydrochlorothiazide (HYDRODIURIL) 12.5 MG tablet TAKE 1 TABLET BY MOUTH EVERY DAY   Multiple Vitamins-Minerals (ANTIOXIDANT VITAMINS) TABS Take 1 tablet by mouth daily. Life Guard   mupirocin ointment (BACTROBAN) 2 % Apply to b/l nares qhs x 7 days then as needed    omeprazole (PRILOSEC) 40 MG capsule TAKE 1 CAPSULE (40 MG TOTAL) BY MOUTH DAILY.   pimecrolimus (ELIDEL) 1 % cream Apply 1 application topically daily. (Patient taking differently: Apply 1 application  topically 3 (three) times daily as needed (rash).)   pyridostigmine (MESTINON) 60 MG/5ML solution Take 2.5 mLs by mouth 3 (three) times daily.   Red Yeast Rice Extract 600 MG TABS Take 1 tablet by mouth daily.    sildenafil (REVATIO) 20 MG tablet TAKE 1-5 TABLETS BY MOUTH EVERY DAY PRN   [DISCONTINUED] Blood Glucose Monitoring Suppl DEVI Check blood sugar 3 times daily.   [DISCONTINUED] glucose blood ([employer] BLOOD GLUCOSE TEST) test strip CHECK BLOOD SUGAR 3 TIMES DAILY   [DISCONTINUED] Lancets Misc. MISC Check blood sugar 3 times daily.   No facility-administered medications prior to visit.    ROS All review of systems negative except what is listed in the HPI        Objective:     BP 136/76   Pulse 93   Ht 5\' 6"  (1.676 m)   Wt 208 lb (94.3 kg)   SpO2 100%   BMI 33.57 kg/m     Physical Exam Vitals reviewed.  Constitutional:      Appearance: Normal appearance. He is obese.  HENT:     Head: Normocephalic and atraumatic.     Right Ear: Tympanic membrane normal.     Left Ear: Tympanic membrane normal.     Nose: Nose normal.     Mouth/Throat:     Mouth: Mucous membranes are moist.     Pharynx: Oropharynx is clear.  Eyes:     Extraocular Movements: Extraocular movements intact.     Conjunctiva/sclera: Conjunctivae normal.     Pupils: Pupils are equal, round, and reactive to light.  Cardiovascular:     Rate and Rhythm: Normal rate and regular rhythm.     Pulses: Normal pulses.  Pulmonary:     Effort: Pulmonary effort is normal.     Breath sounds: Normal breath sounds.  Abdominal:     General: Bowel sounds are normal. There is no distension.     Palpations: Abdomen is soft. There is no mass.     Tenderness: There is no abdominal tenderness. There is no right CVA  tenderness, left CVA tenderness, guarding or rebound.  Musculoskeletal:        General: Normal range of motion.     Cervical back: Normal range of motion and neck supple. No tenderness.  Lymphadenopathy:     Cervical: No cervical adenopathy.  Skin:    General: Skin is warm and dry.     Capillary Refill: Capillary refill takes less than 2 seconds.  Neurological:     General: No focal deficit present.     Mental Status: He is alert and oriented to person, place, and time. Mental status is at baseline.     Cranial Nerves: No cranial nerve deficit.     Motor: No weakness.     Gait: Gait normal.  Psychiatric:        Mood and Affect: Mood normal.        Behavior: Behavior normal.        Thought Content: Thought content normal.        Judgment: Judgment normal.         No results found for any visits on 05/13/23.      Assessment & Plan:    Routine Health Maintenance and Physical Exam Discussed health promotion and safety including diet and exercise recommendations, dental health, and injury prevention. Tobacco cessation if applicable. Seat belts, sunscreen, smoke detectors, etc.    Immunization History  Administered Date(s) Administered   Fluad Quad(high Dose 65+) 02/09/2019, 01/22/2020, 12/30/2021   Influenza, High Dose Seasonal PF 12/19/2017, 12/19/2017, 01/11/2023   Influenza,inj,Quad PF,6+ Mos 03/31/2015, 03/17/2016   Influenza-Unspecified 03/31/2015, 12/30/2021   Moderna Sars-Covid-2 Vaccination 05/26/2019, 06/23/2019, 03/05/2020   Pneumococcal Conjugate-13 12/19/2017   Pneumococcal Polysaccharide-23 02/09/2019   Tdap 04/15/2008   Zoster Recombinant(Shingrix) 02/15/2022, 05/04/2022   Zoster, Live 12/03/2015    Health Maintenance  Topic Date Due   FOOT EXAM  Never done   OPHTHALMOLOGY EXAM  Never done   Hepatitis C Screening  Never done   DTaP/Tdap/Td (2 - Td or Tdap) 04/15/2018   Medicare Annual Wellness (AWV)  12/18/2022   HEMOGLOBIN A1C  01/25/2023    Colonoscopy  01/31/2023   COVID-19 Vaccine (4 - 2024-25 season) 05/11/2024 (Originally 12/12/2022)   Diabetic kidney evaluation - eGFR measurement  07/26/2023   Diabetic kidney evaluation - Urine ACR  07/26/2023   Pneumonia Vaccine 34+ Years old  Completed   INFLUENZA VACCINE  Completed   Zoster Vaccines- Shingrix  Completed   HPV VACCINES  Aged Out  Problem List Items Addressed This Visit       Active Problems   HTN (hypertension)   Blood pressure is at goal for age and co-morbidities.   Recommendations: continue current regimen - BP goal <130/80 - monitor and log blood pressures at home - check around the same time each day in a relaxed setting - Limit salt to <2000 mg/day - Follow DASH eating plan (heart healthy diet) - limit alcohol to 2 standard drinks per day for men and 1 per day for women - avoid tobacco products - get at least 2 hours of regular aerobic exercise weekly Patient aware of signs/symptoms requiring further/urgent evaluation. Labs updated today.       Relevant Orders   Comprehensive metabolic panel   Hyperlipidemia   Medication management: lifestyle measures Lifestyle factors for lowering cholesterol include: Diet therapy - heart-healthy diet rich in fruits, veggies, fiber-rich whole grains, lean meats, chicken, fish (at least twice a week), fat-free or 1% dairy products; foods low in saturated/trans fats, cholesterol, sodium, and sugar. Mediterranean diet has shown to be very heart healthy. Regular exercise - recommend at least 30 minutes a day, 5 times per week Weight management  Repeat CMP and lipid panel today       Relevant Orders   Lipid panel   Type 2 diabetes mellitus with obesity (HCC)   Lab Results  Component Value Date   HGBA1C 6.2 07/26/2022  No meds currently Not checking glucose at home Possibly starting with some neuropathy - discussed supportive measures and possibility for medication if worsening.        Relevant  Orders   Comprehensive metabolic panel   Hemoglobin A1c   Other Visit Diagnoses       Annual physical exam    -  Primary Health promotion and safety discussed.  Labs today.     Relevant Orders   Comprehensive metabolic panel   CBC with Differential/Platelet   Lipid panel   TSH   Hemoglobin A1c       Return in about 6 months (around 11/10/2023) for routine follow-up; schedule AWV.     Clayborne Dana, NP

## 2023-05-13 NOTE — Assessment & Plan Note (Signed)
Medication management: lifestyle measures Lifestyle factors for lowering cholesterol include: Diet therapy - heart-healthy diet rich in fruits, veggies, fiber-rich whole grains, lean meats, chicken, fish (at least twice a week), fat-free or 1% dairy products; foods low in saturated/trans fats, cholesterol, sodium, and sugar. Mediterranean diet has shown to be very heart healthy. Regular exercise - recommend at least 30 minutes a day, 5 times per week Weight management  Repeat CMP and lipid panel today

## 2023-05-13 NOTE — Assessment & Plan Note (Signed)
Blood pressure is at goal for age and co-morbidities.   Recommendations: continue current regimen - BP goal <130/80 - monitor and log blood pressures at home - check around the same time each day in a relaxed setting - Limit salt to <2000 mg/day - Follow DASH eating plan (heart healthy diet) - limit alcohol to 2 standard drinks per day for men and 1 per day for women - avoid tobacco products - get at least 2 hours of regular aerobic exercise weekly Patient aware of signs/symptoms requiring further/urgent evaluation. Labs updated today.  

## 2023-05-13 NOTE — Assessment & Plan Note (Signed)
Lab Results  Component Value Date   HGBA1C 6.2 07/26/2022  No meds currently Not checking glucose at home Possibly starting with some neuropathy - discussed supportive measures and possibility for medication if worsening.

## 2023-05-15 ENCOUNTER — Encounter: Payer: Self-pay | Admitting: Family Medicine

## 2023-05-16 ENCOUNTER — Encounter: Payer: Self-pay | Admitting: Internal Medicine

## 2023-06-08 ENCOUNTER — Other Ambulatory Visit: Payer: Self-pay | Admitting: Family Medicine

## 2023-06-09 ENCOUNTER — Other Ambulatory Visit: Payer: Self-pay | Admitting: Family Medicine

## 2023-06-11 DIAGNOSIS — Z860101 Personal history of adenomatous and serrated colon polyps: Secondary | ICD-10-CM

## 2023-06-11 HISTORY — DX: Personal history of adenomatous and serrated colon polyps: Z86.0101

## 2023-06-13 ENCOUNTER — Ambulatory Visit (AMBULATORY_SURGERY_CENTER): Payer: Medicare Other

## 2023-06-13 VITALS — Ht 66.0 in | Wt 200.0 lb

## 2023-06-13 DIAGNOSIS — Z1211 Encounter for screening for malignant neoplasm of colon: Secondary | ICD-10-CM

## 2023-06-13 MED ORDER — SUFLAVE 178.7 G PO SOLR
1.0000 | ORAL | 0 refills | Status: DC
Start: 2023-06-13 — End: 2023-06-28

## 2023-06-13 NOTE — Progress Notes (Signed)

## 2023-06-20 ENCOUNTER — Encounter: Payer: Self-pay | Admitting: Internal Medicine

## 2023-06-26 DIAGNOSIS — I10 Essential (primary) hypertension: Secondary | ICD-10-CM | POA: Diagnosis not present

## 2023-06-26 DIAGNOSIS — D120 Benign neoplasm of cecum: Secondary | ICD-10-CM | POA: Diagnosis not present

## 2023-06-26 DIAGNOSIS — E785 Hyperlipidemia, unspecified: Secondary | ICD-10-CM | POA: Diagnosis not present

## 2023-06-27 NOTE — Progress Notes (Unsigned)
 [facility] History and Physical   Primary Care Physician:  Bradd Canary, MD   Reason for Procedure:  Colon cancer screening  Plan:    Colonoscopy     HPI: Gabriel [name] is a 73 y.o. male with a history of achalasia and myotomy, here for a screening colonoscopy.  A 2014 exam revealed a polyp but that was not precancerous.   Past Medical History:  Diagnosis Date   Achalasia    Arthritis    Colon cancer screening 08/11/2014   Sees [facility], Dr Leone Payor    Colon polyp 08/11/2014   Sees [facility], Dr Leone Payor     Eczema 01/09/2014   Esophageal reflux 05/21/2012   Hyperlipidemia    Hypertension    Insomnia 01/09/2014   Left hip pain 08/11/2014   OA (osteoarthritis) of hip    right   Otitis, externa, infective 03/31/2015   Overweight 11/14/2012   Right hip pain 08/11/2014   Sees chiropractor, Dr Bradly Chris in [place]    Skin lesion of left leg 10/07/2015   Tinnitus 10/12/2016   Ulcer    esophageal ulcer hx   Wears contact lenses     Past Surgical History:  Procedure Laterality Date   COLONOSCOPY  2014   normal   ESOPHAGOGASTRODUODENOSCOPY     HELLER MYOTOMY  1999   help with swallowing   TONSILLECTOMY     TONSILLECTOMY AND ADENOIDECTOMY  1963   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   Procedure: LEFT TOTAL HIP ARTHROPLASTY ANTERIOR APPROACH;  Surgeon: Kathryne Hitch, MD;  Location: [facility] OR;  Service: Orthopedics;  Laterality: Left;   TOTAL HIP ARTHROPLASTY Right 05/03/2017   Procedure: RIGHT TOTAL HIP ARTHROPLASTY ANTERIOR APPROACH;  Surgeon: Kathryne Hitch, MD;  Location: [facility] OR;  Service: Orthopedics;  Laterality: Right;   UPPER GASTROINTESTINAL ENDOSCOPY      Prior to Admission medications   Medication Sig Start Date End Date Taking? Authorizing Provider  enalapril (VASOTEC) 20 MG tablet TAKE 1 TABLET BY MOUTH EVERY DAY 06/09/23   Bradd Canary, MD  FLUAD QUADRIVALENT 0.5 ML injection  03/10/21   [provider]  FLUZONE HIGH-DOSE 0.5 ML injection   01/11/23   [provider]  hydrochlorothiazide (HYDRODIURIL) 12.5 MG tablet TAKE 1 TABLET BY MOUTH EVERY DAY 06/08/23   Bradd Canary, MD  Multiple Vitamins-Minerals (ANTIOXIDANT VITAMINS) TABS Take 1 tablet by mouth daily. Life Guard    [provider]  mupirocin ointment (BACTROBAN) 2 % Apply to b/l nares qhs x 7 days then as needed 03/02/21   Bradd Canary, MD  naproxen sodium (ALEVE) 220 MG tablet Take 220 mg by mouth daily as needed.    [provider]  omeprazole (PRILOSEC) 40 MG capsule TAKE 1 CAPSULE (40 MG TOTAL) BY MOUTH DAILY. 05/09/23   Bradd Canary, MD  PEG 3350-KCl-NaCl-NaSulf-MgSul (SUFLAVE) 178.7 g SOLR Take 1 kit by mouth as directed. 06/13/23   Iva Boop, MD  pimecrolimus (ELIDEL) 1 % cream Apply 1 application topically daily. Patient taking differently: Apply 1 application  topically 3 (three) times daily as needed (rash). 01/08/14   Bradd Canary, MD  pyridostigmine (MESTINON) 60 MG/5ML solution Take 2.5 mLs by mouth daily. 01/26/18   [provider]  Red Yeast Rice Extract 600 MG TABS Take 1 tablet by mouth daily.     [provider]  sildenafil (REVATIO) 20 MG tablet TAKE 1-5 TABLETS BY MOUTH EVERY DAY PRN 02/10/23   Bradd Canary, MD  Current Outpatient Medications  Medication Sig Dispense Refill   enalapril (VASOTEC) 20 MG tablet TAKE 1 TABLET BY MOUTH EVERY DAY 90 tablet 0   hydrochlorothiazide (HYDRODIURIL) 12.5 MG tablet TAKE 1 TABLET BY MOUTH EVERY DAY 90 tablet 1   Multiple Vitamins-Minerals (ANTIOXIDANT VITAMINS) TABS Take 1 tablet by mouth daily. Life Guard     omeprazole (PRILOSEC) 40 MG capsule TAKE 1 CAPSULE (40 MG TOTAL) BY MOUTH DAILY. 90 capsule 1   pimecrolimus (ELIDEL) 1 % cream Apply 1 application topically daily. (Patient taking differently: Apply 1 application  topically 3 (three) times daily as needed (rash).) 60 g 5   pyridostigmine (MESTINON) 60 MG/5ML solution Take 2.5 mLs by mouth daily.  12    Red Yeast Rice Extract 600 MG TABS Take 1 tablet by mouth daily.      mupirocin ointment (BACTROBAN) 2 % Apply to b/l nares qhs x 7 days then as needed 22 g 1   naproxen sodium (ALEVE) 220 MG tablet Take 220 mg by mouth daily as needed.     sildenafil (REVATIO) 20 MG tablet TAKE 1-5 TABLETS BY MOUTH EVERY DAY PRN 50 tablet 2   Current Facility-Administered Medications  Medication Dose Route Frequency Provider Last Rate Last Admin   0.9 %  sodium chloride infusion  500 mL Intravenous Once Iva Boop, MD        Allergies as of 06/28/2023   (No Known Allergies)    Family History  Problem Relation Age of Onset   Alcohol abuse Mother    Ovarian cancer Mother    Diabetes Maternal Grandmother    Alcohol abuse Father    Lung cancer Father    Mental illness Sister    Depression Sister    Heart disease Brother        congenital heart   Arthritis Sister    Hypertension Sister    Diverticulosis Sister    Healthy Sister    Healthy Sister    Healthy Sister    Colon cancer Neg Hx    Esophageal cancer Neg Hx    Rectal cancer Neg Hx    Stomach cancer Neg Hx     Social History   Socioeconomic History   Marital status: Married    Spouse name: Not on file   Number of children: 2   Years of education: Not on file   Highest education level: Some college, no degree  Occupational History   Occupation: sales/distribution    Employer: [employer]  Tobacco Use   Smoking status: Former    Current packs/day: 0.00    Types: Cigarettes    Quit date: 04/13/1991    Years since quitting: 32.2   Smokeless tobacco: Never   Tobacco comments:    1 ppd for 25 years quit 93'  Vaping Use   Vaping status: Never Used  Substance and Sexual Activity   Alcohol use: Yes    Alcohol/week: 7.0 standard drinks of alcohol    Types: 7 Glasses of wine per week    Comment: Wine occasionally   Drug use: No   Sexual activity: Yes  Other Topics Concern   Not on file  Social History  Narrative   Married 2 daughters   Sports administrator for [employer]         Social Drivers of Health   Financial Resource Strain: Low Risk  (07/26/2022)   Overall Financial Resource Strain (CARDIA)    Difficulty of Paying Living Expenses:  Not hard at all  Food Insecurity: No Food Insecurity (07/26/2022)   Hunger Vital Sign    Worried About Running Out of Food in the Last Year: Never true    Ran Out of Food in the Last Year: Never true  Transportation Needs: No Transportation Needs (07/26/2022)   PRAPARE - Administrator, Civil Service (Medical): No    Lack of Transportation (Non-Medical): No  Physical Activity: Sufficiently Active (07/26/2022)   Exercise Vital Sign    Days of Exercise per Week: 5 days    Minutes of Exercise per Session: 30 min  Stress: No Stress Concern Present (07/26/2022)   [employer] of Occupational Health - Occupational Stress Questionnaire    Feeling of Stress : Not at all  Social Connections: Socially Integrated (07/26/2022)   Social Connection and Isolation Panel [NHANES]    Frequency of Communication with Friends and Family: More than three times a week    Frequency of Social Gatherings with Friends and Family: Twice a week    Attends Religious Services: More than 4 times per year    Active Member of [employer] or Organizations: Yes    Attends Engineer, structural: More than 4 times per year    Marital Status: Married  Catering manager Violence: Not At Risk (12/08/2020)   Humiliation, Afraid, Rape, and Kick questionnaire    Fear of Current or Ex-Partner: No    Emotionally Abused: No    Physically Abused: No    Sexually Abused: No    Review of Systems:  All other review of systems negative except as mentioned in the HPI.  Physical Exam: Vital signs BP (!) 142/70   Pulse 84   Temp 97.7 F (36.5 C) (Temporal)   Ht 5\' 6"  (1.676 m)   Wt 200 lb (90.7 kg)   SpO2 97%   BMI 32.28 kg/m   General:   Alert,   Well-developed, well-nourished, pleasant and cooperative in NAD Lungs:  Clear throughout to auscultation.   Heart:  Regular rate and rhythm; no murmurs, clicks, rubs,  or gallops. Abdomen:  Soft, nontender and nondistended. Normal bowel sounds.   Neuro/Psych:  Alert and cooperative. Normal mood and affect. A and O x 3   @Chao Blazejewski  Sena Slate, MD, [facility] [phone] (pager) 06/28/2023 8:50 AM@

## 2023-06-28 ENCOUNTER — Encounter: Payer: Self-pay | Admitting: Internal Medicine

## 2023-06-28 ENCOUNTER — Ambulatory Visit (AMBULATORY_SURGERY_CENTER): Payer: Medicare Other | Admitting: Internal Medicine

## 2023-06-28 VITALS — BP 141/83 | HR 86 | Temp 97.7°F | Resp 18 | Ht 66.0 in | Wt 200.0 lb

## 2023-06-28 DIAGNOSIS — Z1211 Encounter for screening for malignant neoplasm of colon: Secondary | ICD-10-CM | POA: Diagnosis not present

## 2023-06-28 DIAGNOSIS — K621 Rectal polyp: Secondary | ICD-10-CM | POA: Diagnosis not present

## 2023-06-28 DIAGNOSIS — D120 Benign neoplasm of cecum: Secondary | ICD-10-CM | POA: Diagnosis not present

## 2023-06-28 DIAGNOSIS — K5730 Diverticulosis of large intestine without perforation or abscess without bleeding: Secondary | ICD-10-CM

## 2023-06-28 DIAGNOSIS — D128 Benign neoplasm of rectum: Secondary | ICD-10-CM

## 2023-06-28 DIAGNOSIS — D125 Benign neoplasm of sigmoid colon: Secondary | ICD-10-CM

## 2023-06-28 DIAGNOSIS — K635 Polyp of colon: Secondary | ICD-10-CM

## 2023-06-28 MED ORDER — SODIUM CHLORIDE 0.9 % IV SOLN: 500.0000 mL | Freq: Once | INTRAVENOUS | Status: AC

## 2023-06-28 NOTE — Progress Notes (Signed)
 A/o x 3, VSS, gd SR's, pleased with anesthesia, report to RN

## 2023-06-28 NOTE — Progress Notes (Signed)
 Pt's states no medical or surgical changes since previsit or office visit.

## 2023-06-28 NOTE — Patient Instructions (Addendum)
 I found and removed 3 tiny polyps.  You also have a condition called diverticulosis - common and not usually a problem. Please read the handout provided.  I will let you know pathology results and when to have another routine colonoscopy by mail and/or My Chart.  I appreciate the opportunity to care for you. Iva Boop, MD, FACG   YOU HAD AN ENDOSCOPIC PROCEDURE TODAY AT THE [facility]:   Refer to the procedure report that was given to you for any specific questions about what was found during the examination.  If the procedure report does not answer your questions, please call your gastroenterologist to clarify.  If you requested that your care partner not be given the details of your procedure findings, then the procedure report has been included in a sealed envelope for you to review at your convenience later.  YOU SHOULD EXPECT: Some feelings of bloating in the abdomen. Passage of more gas than usual.  Walking can help get rid of the air that was put into your GI tract during the procedure and reduce the bloating. If you had a lower endoscopy (such as a colonoscopy or flexible sigmoidoscopy) you may notice spotting of blood in your stool or on the toilet paper. If you underwent a bowel prep for your procedure, you may not have a normal bowel movement for a few days.  Please Note:  You might notice some irritation and congestion in your nose or some drainage.  This is from the oxygen used during your procedure.  There is no need for concern and it should clear up in a day or so.  SYMPTOMS TO REPORT IMMEDIATELY:  Following lower endoscopy (colonoscopy or flexible sigmoidoscopy):  Excessive amounts of blood in the stool  Significant tenderness or worsening of abdominal pains  Swelling of the abdomen that is new, acute  Fever of 100F or higher   For urgent or emergent issues, a gastroenterologist can be reached at any hour by calling [phone]. Do not use MyChart  messaging for urgent concerns.    DIET:  We do recommend a small meal at first, but then you may proceed to your regular diet.  Drink plenty of fluids but you should avoid alcoholic beverages for 24 hours.  MEDICATIONS: Continue present medications.  FOLLOW UP: Await pathology results. Repeat colonoscopy is recommended. The colonoscopy date will be determined after pathology results from today's exam become available for review.  Please see handouts given to you by your recovery nurse: Polyps, Diverticulosis.  Thank you for allowing [place] to provide for your healthcare needs today.  ACTIVITY:  You should plan to take it easy for the rest of today and you should NOT DRIVE or use heavy machinery until tomorrow (because of the sedation medicines used during the test).    FOLLOW UP: Our staff will call the number listed on your records the next business day following your procedure.  We will call around 7:15- 8:00 am to check on you and address any questions or concerns that you may have regarding the information given to you following your procedure. If we do not reach you, we will leave a message.     If any biopsies were taken you will be contacted by phone or by letter within the next 1-3 weeks.  Please call us at [phone] if you have not heard about the biopsies in 3 weeks.    SIGNATURES/CONFIDENTIALITY: You and/or your care partner have signed paperwork which will  be entered into your electronic medical record.  These signatures attest to the fact that that the information above on your After Visit Summary has been reviewed and is understood.  Full responsibility of the confidentiality of this discharge information lies with you and/or your care-partner.

## 2023-06-28 NOTE — Op Note (Addendum)
 [facility] Patient Name: Gabriel [name] Procedure Date: 06/28/2023 8:41 AM MRN: [id number] Endoscopist: Iva Boop , MD, [id number] Age: 73 Referring MD:  Date of Birth: 06-11-1950 Gender: Male Account #: [id number] Procedure:                Colonoscopy Indications:              Screening for colorectal malignant neoplasm, Last                            colonoscopy: 2014 Medicines:                Monitored Anesthesia Care Procedure:                Pre-Anesthesia Assessment:                           - Prior to the procedure, a History and Physical                            was performed, and patient medications and                            allergies were reviewed. The patient's tolerance of                            previous anesthesia was also reviewed. The risks                            and benefits of the procedure and the sedation                            options and risks were discussed with the patient.                            All questions were answered, and informed consent                            was obtained. Prior Anticoagulants: The patient has                            taken no anticoagulant or antiplatelet agents. ASA                            Grade Assessment: II - A patient with mild systemic                            disease. After reviewing the risks and benefits,                            the patient was deemed in satisfactory condition to                            undergo the procedure.  After obtaining informed consent, the colonoscope                            was passed under direct vision. Throughout the                            procedure, the patient's blood pressure, pulse, and                            oxygen saturations were monitored continuously. The                            Olympus Scope [id number] was introduced through the                            anus and advanced to the the cecum,  identified by                            appendiceal orifice and ileocecal valve. The                            colonoscopy was performed without difficulty. The                            patient tolerated the procedure well. The quality                            of the bowel preparation was good. The ileocecal                            valve, appendiceal orifice, and rectum were                            photographed. The bowel preparation used was                            SUFLAVE via split dose instruction. Scope In: 8:59:18 AM Scope Out: 9:13:52 AM Scope Withdrawal Time: 0 hours 12 minutes 9 seconds  Total Procedure Duration: 0 hours 14 minutes 34 seconds  Findings:                 The perianal and digital rectal examinations were                            normal.                           Three sessile polyps were found in the rectum,                            sigmoid colon and cecum. The polyps were diminutive                            in size. These polyps were removed with a cold  snare. Resection and retrieval were complete.                            Verification of patient identification for the                            specimen was done. Estimated blood loss was minimal.                           Multiple diverticula were found in the sigmoid                            colon.                           The exam was otherwise without abnormality on                            direct and retroflexion views. Complications:            No immediate complications. Estimated Blood Loss:     Estimated blood loss was minimal. Impression:               - Three diminutive polyps in the rectum, in the                            sigmoid colon and in the cecum, removed with a cold                            snare. Resected and retrieved.                           - Diverticulosis in the sigmoid colon.                           - The examination was  otherwise normal on direct                            and retroflexion views. Recommendation:           - Patient has a contact number available for                            emergencies. The signs and symptoms of potential                            delayed complications were discussed with the                            patient. Return to normal activities tomorrow.                            Written discharge instructions were provided to the                            patient.                           -  Resume previous diet.                           - Continue present medications.                           - Await pathology results.                           - Repeat colonoscopy may be recommended - given age                            routine repeat may not be necessary. The                            colonoscopy date will be determined after pathology                            results from today's exam become available for                            review. Iva Boop, MD 06/28/2023 9:30:13 AM This report has been signed electronically.

## 2023-06-28 NOTE — Progress Notes (Signed)
 Called to room to assist during endoscopic procedure.  Patient ID and intended procedure confirmed with present staff. Received instructions for my participation in the procedure from the performing physician.

## 2023-06-29 ENCOUNTER — Telehealth: Payer: Self-pay

## 2023-06-29 NOTE — Telephone Encounter (Signed)
  Follow up Call-     06/28/2023    8:01 AM  Call back number  Post procedure Call Back phone  # [phone]  Permission to leave phone message Yes     Patient questions:  Do you have a fever, pain , or abdominal swelling? No. Pain Score  0 *  Have you tolerated food without any problems? Yes.    Have you been able to return to your normal activities? Yes.    Do you have any questions about your discharge instructions: Diet   No. Medications  No. Follow up visit  No.  Do you have questions or concerns about your Care? No.  Actions: * If pain score is 4 or above: No action needed, pain <4.

## 2023-06-30 LAB — SURGICAL PATHOLOGY

## 2023-07-06 ENCOUNTER — Encounter: Payer: Self-pay | Admitting: Podiatry

## 2023-07-06 ENCOUNTER — Ambulatory Visit: Payer: Medicare Other | Admitting: Podiatry

## 2023-07-06 DIAGNOSIS — E1142 Type 2 diabetes mellitus with diabetic polyneuropathy: Secondary | ICD-10-CM | POA: Diagnosis not present

## 2023-07-06 NOTE — Progress Notes (Signed)
  Subjective:  Patient ID: Gabriel [name], male    DOB: [birth year],   MRN: [id number]  No chief complaint on file.   73 y.o. male presents for concern of some new numbness in his feet mostly his right great toe. Relates he just wanted to make sure everything was ok. . Relates burning and tingling in their feet. Patient is diabetic and last A1c was  Lab Results  Component Value Date   HGBA1C 6.4 05/13/2023   .   PCP:  Bradd Canary, MD   .  . Denies any other pedal complaints. Denies n/v/f/c.   Past Medical History:  Diagnosis Date   Achalasia    Arthritis    Colon cancer screening 08/11/2014   Sees [facility], Dr Leone Payor    Colon polyp 08/11/2014   Sees [facility], Dr Leone Payor     Eczema 01/09/2014   Esophageal reflux 05/21/2012   Hyperlipidemia    Hypertension    Insomnia 01/09/2014   Left hip pain 08/11/2014   OA (osteoarthritis) of hip    right   Otitis, externa, infective 03/31/2015   Overweight 11/14/2012   Right hip pain 08/11/2014   Sees chiropractor, Dr Bradly Chris in [place]    Skin lesion of left leg 10/07/2015   Tinnitus 10/12/2016   Ulcer    esophageal ulcer hx   Wears contact lenses     Objective:  Physical Exam: Vascular: DP/PT pulses 2/4 bilateral. CFT <3 seconds. Normal hair growth on digits. No edema.  Skin. No lacerations or abrasions bilateral feet. Right hallux nail thickened and dystrophic.  Musculoskeletal: MMT 5/5 bilateral lower extremities in DF, PF, Inversion and Eversion. Deceased ROM in DF of ankle joint.  Neurological: Sensation intact to light touch.   Assessment:   1. Type 2 diabetes mellitus with peripheral neuropathy (HCC)       Plan:  Patient was evaluated and treated and all questions answered. Discussed neuropathy and etiology as well as treatment with patient.  Radiographs reviewed and discussed with patient.  -Discussed and educated patient on diabetic foot care, especially with  regards to the vascular, neurological and musculoskeletal systems.   -Stressed the importance of good glycemic control and the detriment of not  controlling glucose levels in relation to the foot. -Discussed supportive shoes at all times and checking feet regularly.  -Patient to return in 1 year for diabetic foot check.     Louann Sjogren, DPM

## 2023-07-07 ENCOUNTER — Encounter: Payer: Self-pay | Admitting: Internal Medicine

## 2023-08-18 ENCOUNTER — Telehealth: Payer: Self-pay | Admitting: Family Medicine

## 2023-08-18 NOTE — Telephone Encounter (Signed)
 Copied from CRM [id number]. Topic: Medicare AWV >> Aug 18, 2023  1:58 PM Juliana Ocean wrote: Reason for CRM: Called 08/18/2023 to sched AWV - NO VOICEMAIL  Rosalee Collins; Care Guide Ambulatory Clinical Support [facility] Direct Dial: [phone]

## 2023-09-16 ENCOUNTER — Other Ambulatory Visit: Payer: Self-pay | Admitting: Family Medicine

## 2023-09-16 ENCOUNTER — Telehealth: Payer: Self-pay

## 2023-09-16 MED ORDER — ENALAPRIL MALEATE 20 MG PO TABS: 20.0000 mg | ORAL_TABLET | Freq: Every day | ORAL | 0 refills | Status: DC

## 2023-09-16 NOTE — Telephone Encounter (Signed)
 Called patient and no answer left detailed vm advising patient that the 90 day supply will be send into pharmacy but he will need to call the office to schedule a 6 month follow-up around August or September of this year.

## 2023-09-16 NOTE — Telephone Encounter (Signed)
 Copied from CRM [id number]. Topic: Clinical - Prescription Issue >> Sep 16, 2023  9:26 AM Gabriel [name] wrote: Reason for CRM: Patient called states medication was requested for him and sent in but only 30 days was sent. For insurance and cost he needs it changed to 90. Read note that says he will need appt for further refills. Patient states he had complete physical in Feb and has been taking medication for 10 years and does  not understand. Would like a 90 days or a call back with further information. Thank You

## 2023-11-11 ENCOUNTER — Other Ambulatory Visit: Payer: Self-pay | Admitting: Family Medicine

## 2023-11-11 DIAGNOSIS — R1310 Dysphagia, unspecified: Secondary | ICD-10-CM

## 2023-11-11 DIAGNOSIS — K222 Esophageal obstruction: Secondary | ICD-10-CM

## 2023-11-11 DIAGNOSIS — K220 Achalasia of cardia: Secondary | ICD-10-CM

## 2023-11-11 DIAGNOSIS — K2100 Gastro-esophageal reflux disease with esophagitis, without bleeding: Secondary | ICD-10-CM

## 2023-11-30 DIAGNOSIS — E785 Hyperlipidemia, unspecified: Secondary | ICD-10-CM | POA: Diagnosis not present

## 2023-11-30 DIAGNOSIS — Z87891 Personal history of nicotine dependence: Secondary | ICD-10-CM | POA: Diagnosis not present

## 2023-11-30 DIAGNOSIS — K220 Achalasia of cardia: Secondary | ICD-10-CM | POA: Diagnosis not present

## 2023-11-30 DIAGNOSIS — K2289 Other specified disease of esophagus: Secondary | ICD-10-CM | POA: Diagnosis not present

## 2023-11-30 DIAGNOSIS — I10 Essential (primary) hypertension: Secondary | ICD-10-CM | POA: Diagnosis not present

## 2023-11-30 DIAGNOSIS — Z79899 Other long term (current) drug therapy: Secondary | ICD-10-CM | POA: Diagnosis not present

## 2023-11-30 DIAGNOSIS — R1310 Dysphagia, unspecified: Secondary | ICD-10-CM | POA: Diagnosis not present

## 2023-11-30 DIAGNOSIS — Z9889 Other specified postprocedural states: Secondary | ICD-10-CM | POA: Diagnosis not present

## 2023-11-30 DIAGNOSIS — K222 Esophageal obstruction: Secondary | ICD-10-CM | POA: Diagnosis not present

## 2023-11-30 DIAGNOSIS — K2100 Gastro-esophageal reflux disease with esophagitis, without bleeding: Secondary | ICD-10-CM | POA: Diagnosis not present

## 2023-12-04 NOTE — Assessment & Plan Note (Signed)
Encourage heart healthy diet such as MIND or DASH diet, increase exercise, avoid trans fats, simple carbohydrates and processed foods, consider a krill or fish or flaxseed oil cap daily.  Supplement and monitor

## 2023-12-04 NOTE — Assessment & Plan Note (Signed)
 Encouraged good sleep hygiene such as dark, quiet room. No blue/green glowing lights such as computer screens in bedroom. No alcohol or stimulants in evening. Cut down on caffeine as able. Regular exercise is helpful but not just prior to bed time.

## 2023-12-04 NOTE — Assessment & Plan Note (Signed)
 Well controlled, no changes to meds. Encouraged heart healthy diet such as the DASH diet and exercise as tolerated.

## 2023-12-04 NOTE — Assessment & Plan Note (Signed)
will set up patient to check sugars with glucometer. hgba1c acceptable, minimize simple carbs. Increase exercise as tolerated.

## 2023-12-05 ENCOUNTER — Ambulatory Visit (INDEPENDENT_AMBULATORY_CARE_PROVIDER_SITE_OTHER): Admitting: Family Medicine

## 2023-12-05 VITALS — BP 138/86 | HR 83 | Resp 16 | Ht 66.0 in | Wt 203.4 lb

## 2023-12-05 DIAGNOSIS — G4700 Insomnia, unspecified: Secondary | ICD-10-CM | POA: Diagnosis not present

## 2023-12-05 DIAGNOSIS — N400 Enlarged prostate without lower urinary tract symptoms: Secondary | ICD-10-CM | POA: Diagnosis not present

## 2023-12-05 DIAGNOSIS — E669 Obesity, unspecified: Secondary | ICD-10-CM

## 2023-12-05 DIAGNOSIS — E1169 Type 2 diabetes mellitus with other specified complication: Secondary | ICD-10-CM

## 2023-12-05 DIAGNOSIS — I10 Essential (primary) hypertension: Secondary | ICD-10-CM

## 2023-12-05 DIAGNOSIS — E785 Hyperlipidemia, unspecified: Secondary | ICD-10-CM | POA: Diagnosis not present

## 2023-12-05 DIAGNOSIS — Z6832 Body mass index (BMI) 32.0-32.9, adult: Secondary | ICD-10-CM

## 2023-12-05 LAB — CBC WITH DIFFERENTIAL/PLATELET
Basophils Absolute: 0 K/uL (ref 0.0–0.1)
Basophils Relative: 0.8 % (ref 0.0–3.0)
Eosinophils Absolute: 0.1 K/uL (ref 0.0–0.7)
Eosinophils Relative: 2.5 % (ref 0.0–5.0)
HCT: 48.2 % (ref 39.0–52.0)
Hemoglobin: 16.2 g/dL (ref 13.0–17.0)
Lymphocytes Relative: 23.8 % (ref 12.0–46.0)
Lymphs Abs: 1.3 K/uL (ref 0.7–4.0)
MCHC: 33.7 g/dL (ref 30.0–36.0)
MCV: 92.5 fl (ref 78.0–100.0)
Monocytes Absolute: 0.5 K/uL (ref 0.1–1.0)
Monocytes Relative: 9.9 % (ref 3.0–12.0)
Neutro Abs: 3.5 K/uL (ref 1.4–7.7)
Neutrophils Relative %: 63 % (ref 43.0–77.0)
Platelets: 270 K/uL (ref 150.0–400.0)
RBC: 5.21 Mil/uL (ref 4.22–5.81)
RDW: 12.7 % (ref 11.5–15.5)
WBC: 5.5 K/uL (ref 4.0–10.5)

## 2023-12-05 LAB — COMPREHENSIVE METABOLIC PANEL WITH GFR
ALT: 30 U/L (ref 0–53)
AST: 18 U/L (ref 0–37)
Albumin: 4.6 g/dL (ref 3.5–5.2)
Alkaline Phosphatase: 51 U/L (ref 39–117)
BUN: 12 mg/dL (ref 6–23)
CO2: 31 meq/L (ref 19–32)
Calcium: 9.4 mg/dL (ref 8.4–10.5)
Chloride: 93 meq/L — ABNORMAL LOW (ref 96–112)
Creatinine, Ser: 0.92 mg/dL (ref 0.40–1.50)
GFR: 83.44 mL/min (ref >60.00)
Glucose, Bld: 128 mg/dL — ABNORMAL HIGH (ref 70–99)
Potassium: 4.4 meq/L (ref 3.5–5.1)
Sodium: 132 meq/L — ABNORMAL LOW (ref 135–145)
Total Bilirubin: 0.9 mg/dL (ref 0.2–1.2)
Total Protein: 7 g/dL (ref 6.0–8.3)

## 2023-12-05 LAB — LIPID PANEL
Cholesterol: 189 mg/dL (ref 0–200)
HDL: 51.2 mg/dL (ref >39.00)
LDL Cholesterol: 113 mg/dL — ABNORMAL HIGH (ref 0–99)
NonHDL: 137.91
Total CHOL/HDL Ratio: 4
Triglycerides: 125 mg/dL (ref 0.0–149.0)
VLDL: 25 mg/dL (ref 0.0–40.0)

## 2023-12-05 LAB — MICROALBUMIN / CREATININE URINE RATIO
Creatinine,U: 40.1 mg/dL
Microalb Creat Ratio: UNDETERMINED mg/g (ref 0.0–30.0)
Microalb, Ur: <0.7 mg/dL

## 2023-12-05 LAB — PSA: PSA: 8.11 ng/mL — ABNORMAL HIGH (ref 0.10–4.00)

## 2023-12-05 LAB — TSH: TSH: 0.78 u[IU]/mL (ref 0.35–5.50)

## 2023-12-05 LAB — HEMOGLOBIN A1C: Hgb A1c MFr Bld: 6.7 % — ABNORMAL HIGH (ref 4.6–6.5)

## 2023-12-05 NOTE — Progress Notes (Signed)
 Subjective:    Patient ID: Gabriel [name], male    DOB: [birth year], 73 y.o.   MRN: [id number]  No chief complaint on file.   HPI Discussed the use of AI scribe software for clinical note transcription with the patient, who gave verbal consent to proceed.  History of Present Illness Gabriel [name] is a 73 year old male with achalasia who presents for a follow-up visit.  He experienced constipation following a recent upper endoscopy procedure, which was unusual for him. He did not have a bowel movement until the morning of the visit and used Miralax to alleviate the constipation, which was effective. This was the first time he experienced such bowel issues after the procedure. He noted that the procedure involved only an upper endoscopy and that he was in the procedure room longer than usual, which he suspects might have contributed to his bowel issues. He also experienced increased soreness in his abdomen following the procedure.  He has a history of achalasia, which has led to the formation of scar tissue that obstructs food passage. He has been under the care of a gastroenterologist who has been attentive to his condition. The scar tissue is typical for someone with achalasia.  He drinks a lot of water, which causes him to get up at least once a night to urinate. He acknowledges that this is due to his own habits of drinking too much water before bed. No recent emergency room visits or acute concerns such as chest pain. However, he mentions the recent deaths of several neighbors and friends, which has been concerning to him.  He is planning a road trip to [place] , [place]  [place], [place] , and [place].    Past Medical History:  Diagnosis Date   Achalasia    Arthritis    Eczema 01/09/2014   Esophageal reflux 05/21/2012   Hx of adenomatous polyp of colon 06/2023   1 diminutive adenoma   Hyperlipidemia    Hypertension    Insomnia 01/09/2014   Left hip pain 08/11/2014   OA  (osteoarthritis) of hip    right   Otitis, externa, infective 03/31/2015   Overweight 11/14/2012   Right hip pain 08/11/2014   Sees chiropractor, Dr Joan in [place]    Skin lesion of left leg 10/07/2015   Tinnitus 10/12/2016   Ulcer    esophageal ulcer hx   Wears contact lenses     Past Surgical History:  Procedure Laterality Date   COLONOSCOPY  2014   normal   ESOPHAGOGASTRODUODENOSCOPY     HELLER MYOTOMY  1999   help with swallowing   TONSILLECTOMY     TONSILLECTOMY AND ADENOIDECTOMY  1963   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   Procedure: LEFT TOTAL HIP ARTHROPLASTY ANTERIOR APPROACH;  Surgeon: Lonni CINDERELLA Poli, MD;  Location: [facility] OR;  Service: Orthopedics;  Laterality: Left;   TOTAL HIP ARTHROPLASTY Right 05/03/2017   Procedure: RIGHT TOTAL HIP ARTHROPLASTY ANTERIOR APPROACH;  Surgeon: Poli Lonni CINDERELLA, MD;  Location: [facility] OR;  Service: Orthopedics;  Laterality: Right;   UPPER GASTROINTESTINAL ENDOSCOPY      Family History  Problem Relation Age of Onset   Alcohol abuse Mother    Ovarian cancer Mother    Diabetes Maternal Grandmother    Alcohol abuse Father    Lung cancer Father    Mental illness Sister    Depression Sister    Heart disease Brother        congenital heart   Arthritis Sister  Hypertension Sister    Diverticulosis Sister    Healthy Sister    Healthy Sister    Healthy Sister    Colon cancer Neg Hx    Esophageal cancer Neg Hx    Rectal cancer Neg Hx    Stomach cancer Neg Hx     Social History   Socioeconomic History   Marital status: Married    Spouse name: Not on file   Number of children: 2   Years of education: Not on file   Highest education level: Associate degree: occupational, Scientist, product/process development, or vocational program  Occupational History   Occupation: sales/distribution    Employer: [employer]  Tobacco Use   Smoking status: Former    Current packs/day: 0.00    Types: Cigarettes    Quit date: 04/13/1991     Years since quitting: 32.6   Smokeless tobacco: Never   Tobacco comments:    1 ppd for 25 years quit 93'  Vaping Use   Vaping status: Never Used  Substance and Sexual Activity   Alcohol use: Yes    Alcohol/week: 7.0 standard drinks of alcohol    Types: 7 Glasses of wine per week    Comment: Wine occasionally   Drug use: No   Sexual activity: Yes  Other Topics Concern   Not on file  Social History Narrative   Married 2 daughters   Sports administrator for [employer]         Social Drivers of Health   Financial Resource Strain: Low Risk  (12/05/2023)   Overall Financial Resource Strain (CARDIA)    Difficulty of Paying Living Expenses: Not hard at all  Food Insecurity: No Food Insecurity (12/05/2023)   Hunger Vital Sign    Worried About Running Out of Food in the Last Year: Never true    Ran Out of Food in the Last Year: Never true  Transportation Needs: No Transportation Needs (12/05/2023)   PRAPARE - Administrator, Civil Service (Medical): No    Lack of Transportation (Non-Medical): No  Physical Activity: Sufficiently Active (12/05/2023)   Exercise Vital Sign    Days of Exercise per Week: 6 days    Minutes of Exercise per Session: 120 min  Stress: No Stress Concern Present (12/05/2023)   [employer] of Occupational Health - Occupational Stress Questionnaire    Feeling of Stress: Not at all  Social Connections: Socially Integrated (12/05/2023)   Social Connection and Isolation Panel    Frequency of Communication with Friends and Family: More than three times a week    Frequency of Social Gatherings with Friends and Family: Once a week    Attends Religious Services: More than 4 times per year    Active Member of [employer] or Organizations: Yes    Attends Engineer, structural: More than 4 times per year    Marital Status: Married  Catering manager Violence: Not At Risk (12/08/2020)   Humiliation, Afraid, Rape, and Kick questionnaire     Fear of Current or Ex-Partner: No    Emotionally Abused: No    Physically Abused: No    Sexually Abused: No    Outpatient Medications Prior to Visit  Medication Sig Dispense Refill   enalapril  (VASOTEC ) 20 MG tablet Take 1 tablet (20 mg total) by mouth daily. 90 tablet 0   hydrochlorothiazide  (HYDRODIURIL ) 12.5 MG tablet TAKE 1 TABLET BY MOUTH EVERY DAY 90 tablet 1   Multiple Vitamins-Minerals (ANTIOXIDANT VITAMINS) TABS Take 1  tablet by mouth daily. Life Guard     mupirocin  ointment (BACTROBAN ) 2 % Apply to b/l nares qhs x 7 days then as needed 22 g 1   omeprazole  (PRILOSEC) 40 MG capsule TAKE 1 CAPSULE (40 MG TOTAL) BY MOUTH DAILY. 90 capsule 1   pimecrolimus  (ELIDEL ) 1 % cream Apply 1 application topically daily. (Patient taking differently: Apply 1 application  topically 3 (three) times daily as needed (rash).) 60 g 5   pyridostigmine (MESTINON) 60 MG/5ML solution Take 2.5 mLs by mouth daily.  12   Red Yeast Rice Extract 600 MG TABS Take 1 tablet by mouth daily.      sildenafil  (REVATIO ) 20 MG tablet TAKE 1-5 TABLETS BY MOUTH EVERY DAY PRN 50 tablet 2   naproxen sodium (ALEVE) 220 MG tablet Take 220 mg by mouth daily as needed.     Facility-Administered Medications Prior to Visit  Medication Dose Route Frequency Provider Last Rate Last Admin   0.9 %  sodium chloride  infusion  500 mL Intravenous Once Avram Lupita BRAVO, MD        No Known Allergies  Review of Systems  Constitutional:  Negative for fever and malaise/fatigue.  HENT:  Negative for congestion.   Eyes:  Negative for blurred vision.  Respiratory:  Negative for shortness of breath.   Cardiovascular:  Negative for chest pain, palpitations and leg swelling.  Gastrointestinal:  Positive for abdominal pain. Negative for blood in stool and nausea.  Genitourinary:  Negative for dysuria and frequency.  Musculoskeletal:  Positive for myalgias. Negative for falls.  Skin:  Negative for rash.  Neurological:  Negative for dizziness,  loss of consciousness and headaches.  Endo/Heme/Allergies:  Negative for environmental allergies.  Psychiatric/Behavioral:  Negative for depression. The patient is not nervous/anxious.        Objective:    Physical Exam Vitals reviewed.  Constitutional:      Appearance: Normal appearance. He is not ill-appearing.  HENT:     Head: Normocephalic and atraumatic.     Nose: Nose normal.  Eyes:     Conjunctiva/sclera: Conjunctivae normal.  Cardiovascular:     Rate and Rhythm: Normal rate.     Pulses: Normal pulses.     Heart sounds: Normal heart sounds. No murmur heard. Pulmonary:     Effort: Pulmonary effort is normal.     Breath sounds: Normal breath sounds. No wheezing.  Abdominal:     Palpations: Abdomen is soft. There is no mass.     Tenderness: There is no abdominal tenderness.  Musculoskeletal:     Cervical back: Normal range of motion.     Right lower leg: No edema.     Left lower leg: No edema.  Skin:    General: Skin is warm and dry.  Neurological:     General: No focal deficit present.     Mental Status: He is alert and oriented to person, place, and time.  Psychiatric:        Mood and Affect: Mood normal.     BP 138/86   Pulse 83   Resp 16   Ht 5' 6 (1.676 m)   Wt 203 lb 6.4 oz (92.3 kg)   SpO2 98%   BMI 32.83 kg/m  Wt Readings from Last 3 Encounters:  12/05/23 203 lb 6.4 oz (92.3 kg)  06/28/23 200 lb (90.7 kg)  06/13/23 200 lb (90.7 kg)    Diabetic Foot Exam - Simple   No data filed    Lab Results  Component Value Date   WBC 5.5 05/13/2023   HGB 15.8 05/13/2023   HCT 47.1 05/13/2023   PLT 279.0 05/13/2023   GLUCOSE 108 (H) 05/13/2023   CHOL 180 05/13/2023   TRIG 137.0 05/13/2023   HDL 52.00 05/13/2023   LDLCALC 100 (H) 05/13/2023   ALT 35 05/13/2023   AST 22 05/13/2023   NA 133 (L) 05/13/2023   K 4.6 05/13/2023   CL 96 05/13/2023   CREATININE 0.97 05/13/2023   BUN 14 05/13/2023   CO2 30 05/13/2023   TSH 1.10 05/13/2023   PSA 4.15  (H) 07/27/2021   HGBA1C 6.4 05/13/2023    Lab Results  Component Value Date   TSH 1.10 05/13/2023   Lab Results  Component Value Date   WBC 5.5 05/13/2023   HGB 15.8 05/13/2023   HCT 47.1 05/13/2023   MCV 95.0 05/13/2023   PLT 279.0 05/13/2023   Lab Results  Component Value Date   NA 133 (L) 05/13/2023   K 4.6 05/13/2023   CO2 30 05/13/2023   GLUCOSE 108 (H) 05/13/2023   BUN 14 05/13/2023   CREATININE 0.97 05/13/2023   BILITOT 0.9 05/13/2023   ALKPHOS 45 05/13/2023   AST 22 05/13/2023   ALT 35 05/13/2023   PROT 6.4 05/13/2023   ALBUMIN 4.4 05/13/2023   CALCIUM 9.2 05/13/2023   ANIONGAP 10 05/04/2017   GFR 78.61 05/13/2023   Lab Results  Component Value Date   CHOL 180 05/13/2023   Lab Results  Component Value Date   HDL 52.00 05/13/2023   Lab Results  Component Value Date   LDLCALC 100 (H) 05/13/2023   Lab Results  Component Value Date   TRIG 137.0 05/13/2023   Lab Results  Component Value Date   CHOLHDL 3 05/13/2023   Lab Results  Component Value Date   HGBA1C 6.4 05/13/2023       Assessment & Plan:  Primary hypertension Assessment & Plan: Well controlled, no changes to meds. Encouraged heart healthy diet such as the DASH diet and exercise as tolerated.     Hyperlipidemia, unspecified hyperlipidemia type Assessment & Plan: Encourage heart healthy diet such as MIND or DASH diet, increase exercise, avoid trans fats, simple carbohydrates and processed foods, consider a krill or fish or flaxseed oil cap daily.  Supplement and monitor    Insomnia, unspecified type Assessment & Plan: Encouraged good sleep hygiene such as dark, quiet room. No blue/green glowing lights such as computer screens in bedroom. No alcohol or stimulants in evening. Cut down on caffeine as able. Regular exercise is helpful but not just prior to bed time.    Type 2 diabetes mellitus with obesity (HCC) Assessment & Plan:  will set up patient to check sugars with  glucometer. hgba1c acceptable, minimize simple carbs. Increase exercise as tolerated.     Enlarged prostate    Assessment and Plan Assessment & Plan Constipation Constipation following a procedure, possibly due to prolonged time in the procedure room. Resolved with the use of Miralax. - Advise to use Miralax as needed for constipation. - Consider mixing Miralax with Benefiber for enhanced effect.  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 Managed by a gastroenterologist with ongoing monitoring. Experiences occasional soreness and prolonged procedure times due to scar tissue formation. - Communicate with gastroenterologist regarding any procedural concerns or symptoms.  Benign prostatic hyperplasia Benign prostatic hyperplasia with occasional nocturia, likely exacerbated by high  fluid intake. Previous prostate cancer screening was clear, but follow-up communication with urologist is lacking. Gets up at least once a night to urinate, attributed to high water intake. - Order PSA test. - Encourage communication with urologist regarding prostate health and follow-up.  Recording duration: 20 minutes     Harlene Horton, MD

## 2023-12-05 NOTE — Patient Instructions (Signed)
 Tetanus booster at pharmacy RSV, Respiratory Syncitial Virus vaccine, Arexvy vaccine at pharmacy  Flu any time.  Prevnar 20 around age 72

## 2023-12-06 ENCOUNTER — Other Ambulatory Visit: Payer: Self-pay | Admitting: Family Medicine

## 2023-12-06 ENCOUNTER — Ambulatory Visit: Payer: Self-pay | Admitting: Family Medicine

## 2023-12-06 DIAGNOSIS — R972 Elevated prostate specific antigen [PSA]: Secondary | ICD-10-CM

## 2023-12-06 NOTE — Telephone Encounter (Signed)
 Copied from CRM [id number]. Topic: Clinical - Lab/Test Results >> Dec 06, 2023  4:15 PM Roselie BROCKS wrote: Reason for CRM: Patient called for test results, results was giving and patient stated he does wants to return to Dr. Donnice Brooks, Urology for care

## 2023-12-15 ENCOUNTER — Other Ambulatory Visit: Payer: Self-pay | Admitting: Family Medicine

## 2024-01-12 ENCOUNTER — Other Ambulatory Visit: Payer: Self-pay | Admitting: Family Medicine

## 2024-02-13 ENCOUNTER — Ambulatory Visit (INDEPENDENT_AMBULATORY_CARE_PROVIDER_SITE_OTHER): Admitting: *Deleted

## 2024-02-13 VITALS — Ht 66.0 in | Wt 203.0 lb

## 2024-02-13 DIAGNOSIS — Z0000 Encounter for general adult medical examination without abnormal findings: Secondary | ICD-10-CM | POA: Diagnosis not present

## 2024-02-13 NOTE — Progress Notes (Addendum)
 Please attest this visit in the absence of patient primary care provider.    I connected with  Gabriel [name] on 02/13/24 by a audio enabled telemedicine application and verified that I am speaking with the correct person using two identifiers.  Patient Location: Home  Provider Location: Office/Clinic  I discussed the limitations of evaluation and management by telemedicine. The patient expressed understanding and agreed to proceed.  Subjective:   Gabriel [name] is a 73 y.o. male who presents for a Medicare Annual Wellness Visit.  Allergies (verified) Patient has no known allergies.   History: Past Medical History:  Diagnosis Date   Achalasia    Arthritis    Eczema 01/09/2014   Esophageal reflux 05/21/2012   Hx of adenomatous polyp of colon 06/2023   1 diminutive adenoma   Hyperlipidemia    Hypertension    Insomnia 01/09/2014   Left hip pain 08/11/2014   OA (osteoarthritis) of hip    right   Otitis, externa, infective 03/31/2015   Overweight 11/14/2012   Right hip pain 08/11/2014   Sees chiropractor, Gabriel [name] in [place]    Skin lesion of left leg 10/07/2015   Tinnitus 10/12/2016   Ulcer    esophageal ulcer hx   Wears contact lenses    Past Surgical History:  Procedure Laterality Date   COLONOSCOPY  2014   normal   ESOPHAGOGASTRODUODENOSCOPY     HELLER MYOTOMY  1999   help with swallowing   TONSILLECTOMY     TONSILLECTOMY AND ADENOIDECTOMY  1963   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   Procedure: LEFT TOTAL HIP ARTHROPLASTY ANTERIOR APPROACH;  Surgeon: Lonni CINDERELLA Poli, MD;  Location: [facility];  Service: Orthopedics;  Laterality: Left;   TOTAL HIP ARTHROPLASTY Right 05/03/2017   Procedure: RIGHT TOTAL HIP ARTHROPLASTY ANTERIOR APPROACH;  Surgeon: Poli Lonni CINDERELLA, MD;  Location: [facility];  Service: Orthopedics;  Laterality: Right;   UPPER GASTROINTESTINAL ENDOSCOPY     Family History  Problem Relation Age of Onset   Alcohol abuse Mother     Ovarian cancer Mother    Diabetes Maternal Grandmother    Alcohol abuse Father    Lung cancer Father    Mental illness Sister    Depression Sister    Heart disease Brother        congenital heart   Arthritis Sister    Hypertension Sister    Diverticulosis Sister    Healthy Sister    Healthy Sister    Healthy Sister    Colon cancer Neg Hx    Esophageal cancer Neg Hx    Rectal cancer Neg Hx    Stomach cancer Neg Hx    Social History   Occupational History   Occupation: sales/distribution    Employer: [employer]  Tobacco Use   Smoking status: Former    Current packs/day: 0.00    Types: Cigarettes    Quit date: 04/13/1991    Years since quitting: 32.8   Smokeless tobacco: Never   Tobacco comments:    1 ppd for 25 years quit 93'  Vaping Use   Vaping status: Never Used  Substance and Sexual Activity   Alcohol use: Yes    Alcohol/week: 7.0 standard drinks of alcohol    Types: 7 Glasses of wine per week    Comment: Wine occasionally   Drug use: No   Sexual activity: Yes   Tobacco Counseling Counseling given: Not Answered Tobacco comments: 1 ppd for 25 years quit 26'  SDOH  Screenings   Food Insecurity: No Food Insecurity (02/13/2024)  Housing: Low Risk  (02/13/2024)  Transportation Needs: No Transportation Needs (02/13/2024)  Utilities: Not At Risk (02/13/2024)  Alcohol Screen: Low Risk  (12/05/2023)  Depression (PHQ2-9): Low Risk  (02/13/2024)  Financial Resource Strain: Low Risk  (12/05/2023)  Physical Activity: Sufficiently Active (02/13/2024)  Social Connections: Socially Integrated (02/13/2024)  Stress: No Stress Concern Present (02/13/2024)  Tobacco Use: Medium Risk (12/05/2023)  Health Literacy: Adequate Health Literacy (02/13/2024)   Depression Screen    02/13/2024    1:53 PM 12/05/2023    8:43 AM 07/26/2022    9:56 AM 04/02/2022   12:36 PM 12/17/2021    9:42 AM 07/27/2021    1:18 PM 12/08/2020    9:06 AM  PHQ 2/9 Scores  PHQ - 2 Score 0 0 0 0 0 0 0   PHQ- 9 Score 0 0 0         Goals Addressed             This Visit's Progress    Patient Stated   Not on track    Join the [place].       Visit info / Clinical Intake: Medicare Wellness Visit Type:: Subsequent Annual Wellness Visit Medicare Wellness Visit Mode:: Telephone If telephone:: video declined If telephone or video:: vitals recorded from last visit Interpreter Needed?: No Pre-visit prep was completed: yes AWV questionnaire completed by patient prior to visit?: yes Date:: 02/06/24 Living arrangements:: lives with spouse/significant other Patient's Overall Health Status Rating: very good Typical amount of pain: none Does pain affect daily life?: no Are you currently prescribed opioids?: no  Dietary Habits and Nutritional Risks How many meals a day?: 2 Eats fruit and vegetables daily?: yes (not as much as I need to) Most meals are obtained by: preparing own meals Diabetic:: (!) yes Any non-healing wounds?: no Would you like to be referred to a Nutritionist or for Diabetic Management? : no  Functional Status Activities of Daily Living (to include ambulation/medication): (Patient-Rptd) Independent Ambulation: Independent Medication Administration: Independent Home Management: (Patient-Rptd) Independent Manage your own finances?: yes Primary transportation is: driving Concerns about vision?: no *vision screening is required for WTM* Concerns about hearing?: no  Fall Screening Falls in the past year?: (Patient-Rptd) 0 Number of falls in past year: 0 Was there an injury with Fall?: (Patient-Rptd) 0 Fall Risk Category Calculator: 0 Patient Fall Risk Level: Low Fall Risk  Fall Risk Patient at Risk for Falls Due to: No Fall Risks Fall risk Follow up: Education provided  Home and Transportation Safety: All rugs have non-skid backing?: yes All stairs or steps have railings?: N/A, no stairs Grab bars in the bathtub or shower?: (!) no Have non-skid surface in  bathtub or shower?: yes Good home lighting?: yes Regular seat belt use?: yes Hospital stays in the last year:: no  Cognitive Assessment Difficulty concentrating, remembering, or making decisions? : no Will 6CIT or Mini Cog be Completed: yes What year is it?: 0 points What month is it?: 0 points Give patient an address phrase to remember (5 components): [address], [place]  About what time is it?: 0 points Count backwards from 20 to 1: 0 points Say the months of the year in reverse: 0 points Repeat the address phrase from earlier: 0 points 6 CIT Score: 0 points  Advance Directives (For Healthcare) Does Patient Have a Medical Advance Directive?: Yes Does patient want to make changes to medical advance directive?: No - Guardian declined  Type of Advance Directive: Healthcare Power of [place]; Living will Copy of Healthcare Power of Attorney in Chart?: No - copy requested Copy of Living Will in Chart?: No - copy requested  Reviewed/Updated  Reviewed/Updated: All        Objective:    Today's Vitals   02/13/24 1343  Weight: 203 lb (92.1 kg)  Height: 5' 6 (1.676 m)   Body mass index is 32.77 kg/m.  Current Medications (verified) Outpatient Encounter Medications as of 02/13/2024  Medication Sig   enalapril  (VASOTEC ) 20 MG tablet TAKE 1 TABLET BY MOUTH EVERY DAY   hydrochlorothiazide  (HYDRODIURIL ) 12.5 MG tablet Take 1 tablet (12.5 mg total) by mouth daily.   Multiple Vitamins-Minerals (ANTIOXIDANT VITAMINS) TABS Take 1 tablet by mouth daily. Life Guard   mupirocin  ointment (BACTROBAN ) 2 % Apply to b/l nares qhs x 7 days then as needed   omeprazole  (PRILOSEC) 40 MG capsule TAKE 1 CAPSULE (40 MG TOTAL) BY MOUTH DAILY.   pimecrolimus  (ELIDEL ) 1 % cream Apply 1 application topically daily. (Patient taking differently: Apply 1 application  topically 3 (three) times daily as needed (rash).)   pyridostigmine (MESTINON) 60 MG/5ML solution Take 2.5 mLs by mouth daily.   Red  Yeast Rice Extract 600 MG TABS Take 1 tablet by mouth daily.    sildenafil  (REVATIO ) 20 MG tablet TAKE 1-5 TABLETS BY MOUTH EVERY DAY PRN   Facility-Administered Encounter Medications as of 02/13/2024  Medication   0.9 %  sodium chloride  infusion   Hearing/Vision screen Hearing Screening - Comments:: Has hearing aids Vision Screening - Comments:: Up to date with routine eye exams with Gabriel Malvina Immunizations and Health Maintenance Health Maintenance  Topic Date Due   OPHTHALMOLOGY EXAM  Never done   Hepatitis C Screening  Never done   DTaP/Tdap/Td (4 - Td or Tdap) 04/15/2018   COVID-19 Vaccine (4 - 2025-26 season) 02/12/2025 (Originally 12/12/2023)   HEMOGLOBIN A1C  06/06/2024   FOOT EXAM  07/05/2024   Diabetic kidney evaluation - eGFR measurement  12/04/2024   Diabetic kidney evaluation - Urine ACR  12/04/2024   Medicare Annual Wellness (AWV)  02/12/2025   Pneumococcal Vaccine: 50+ Years  Completed   Influenza Vaccine  Completed   Zoster Vaccines- Shingrix  Completed   Meningococcal B Vaccine  Aged Out   Colonoscopy  Discontinued    Will get tetanus booster at pharmacy. Declines COVID vaccine at this time. Will complete hep C screen, dm foot exam, A1c next OV.     Assessment/Plan:  This is a routine wellness examination for Irineo.  Patient Care Team: Domenica Harlene LABOR, MD as PCP - General (Family Medicine) Oneil Malvina, OD (Optometry)  I have personally reviewed and noted the following in the patient's chart:   Medical and social history Use of alcohol, tobacco or illicit drugs  Current medications and supplements including opioid prescriptions. Functional ability and status Nutritional status Physical activity Advanced directives List of other physicians Hospitalizations, surgeries, and ER visits in previous 12 months Vitals Screenings to include cognitive, depression, and falls Referrals and appointments  No orders of the defined types were placed in this  encounter.  In addition, I have reviewed and discussed with patient certain preventive protocols, quality metrics, and best practice recommendations. A written personalized care plan for preventive services as well as general preventive health recommendations were provided to patient.   Lolita Libra, CMA   02/13/2024   Return in 1 year (on 02/12/2025).  After Visit Summary: (MyChart) Due to  this being a telephonic visit, the after visit summary with patients personalized plan was offered to patient via MyChart   Nurse Notes: nothing significant to report

## 2024-02-13 NOTE — Patient Instructions (Signed)
 Mr. Gabriel [name],  Thank you for taking the time for your Medicare Wellness Visit. I appreciate your continued commitment to your health goals. Please review the care plan we discussed, and feel free to reach out if I can assist you further.  Please note that Annual Wellness Visits do not include a physical exam. Some assessments may be limited, especially if the visit was conducted virtually. If needed, we may recommend an in-person follow-up with your provider.  Ongoing Care Seeing your primary care provider every 3 to 6 months helps us  monitor your health and provide consistent, personalized care.   Harlene Jolly, NP:  05/24/24 9am Annual Wellness Visit:  02/14/25 1:40pm, telephone   Recommended Screenings:  You will need to get the following vaccines at your local pharmacy: Tetanus  Health Maintenance  Topic Date Due   Eye exam for diabetics  Never done   Hepatitis C Screening  Never done   DTaP/Tdap/Td vaccine (4 - Td or Tdap) 04/15/2018   Medicare Annual Wellness Visit  12/18/2022   COVID-19 Vaccine (4 - 2025-26 season) 02/12/2025*   Hemoglobin A1C  06/06/2024   Complete foot exam   07/05/2024   Yearly kidney function blood test for diabetes  12/04/2024   Yearly kidney health urinalysis for diabetes  12/04/2024   Pneumococcal Vaccine for age over 31  Completed   Flu Shot  Completed   Zoster (Shingles) Vaccine  Completed   Meningitis B Vaccine  Aged Out   Colon Cancer Screening  Discontinued  *Topic was postponed. The date shown is not the original due date.       02/06/2024    3:19 PM  Advanced Directives  Does Patient Have a Medical Advance Directive? Yes  Type of Estate Agent of [place];Living will  Does patient want to make changes to medical advance directive? No - Guardian declined  Copy of Healthcare Power of Attorney in Chart? No - copy requested  You may return a copy of your Advanced Directive(s) by either of the following:  Bring a  copy of your health care power of attorney and living will to the office to be added to your chart at your convenience. You can mail a copy to [facility] [address] [place], [place] [address] or email to [email or web address]   Vision: Annual vision screenings are recommended for early detection of glaucoma, cataracts, and diabetic retinopathy. These exams can also reveal signs of chronic conditions such as diabetes and high blood pressure.  Dental: Annual dental screenings help detect early signs of oral cancer, gum disease, and other conditions linked to overall health, including heart disease and diabetes.  Please see the attached documents for additional preventive care recommendations.

## 2024-02-28 ENCOUNTER — Other Ambulatory Visit: Payer: Self-pay | Admitting: Urology

## 2024-02-28 DIAGNOSIS — R972 Elevated prostate specific antigen [PSA]: Secondary | ICD-10-CM

## 2024-04-06 ENCOUNTER — Ambulatory Visit
Admission: RE | Admit: 2024-04-06 | Discharge: 2024-04-06 | Disposition: A | Discharge status: Home / Self Care | Source: Ambulatory Visit | Attending: Urology | Admitting: Urology

## 2024-04-06 DIAGNOSIS — R972 Elevated prostate specific antigen [PSA]: Secondary | ICD-10-CM

## 2024-04-10 ENCOUNTER — Other Ambulatory Visit (HOSPITAL_COMMUNITY): Payer: Self-pay | Admitting: Urology

## 2024-04-10 DIAGNOSIS — R972 Elevated prostate specific antigen [PSA]: Secondary | ICD-10-CM

## 2024-04-18 ENCOUNTER — Ambulatory Visit (HOSPITAL_COMMUNITY)
Admission: RE | Admit: 2024-04-18 | Discharge: 2024-04-18 | Disposition: A | Discharge status: Home / Self Care | Source: Ambulatory Visit | Attending: Urology | Admitting: Urology

## 2024-04-18 DIAGNOSIS — R972 Elevated prostate specific antigen [PSA]: Secondary | ICD-10-CM | POA: Insufficient documentation

## 2024-04-18 MED ORDER — GADOBUTROL 1 MMOL/ML IV SOLN
9.0000 mL | Freq: Once | INTRAVENOUS | Status: AC | PRN
  Administered 2024-04-18: 9 mL via INTRAVENOUS

## 2024-04-23 ENCOUNTER — Other Ambulatory Visit: Payer: Self-pay | Admitting: Family Medicine

## 2024-05-06 ENCOUNTER — Other Ambulatory Visit: Payer: Self-pay | Admitting: Family Medicine

## 2024-05-06 DIAGNOSIS — K222 Esophageal obstruction: Secondary | ICD-10-CM

## 2024-05-06 DIAGNOSIS — R1310 Dysphagia, unspecified: Secondary | ICD-10-CM

## 2024-05-06 DIAGNOSIS — K2100 Gastro-esophageal reflux disease with esophagitis, without bleeding: Secondary | ICD-10-CM

## 2024-05-06 DIAGNOSIS — K220 Achalasia of cardia: Secondary | ICD-10-CM

## 2024-05-24 ENCOUNTER — Encounter: Admitting: Student

## 2024-07-11 ENCOUNTER — Ambulatory Visit: Admitting: Podiatry

## 2025-02-14 ENCOUNTER — Ambulatory Visit
